# Patient Record
Sex: Female | Born: 1950 | Race: White | Hispanic: No | Marital: Married | Smoking: Never smoker
Health system: Southern US, Community
[De-identification: ages and names within clinical notes are randomized; demographics above are authoritative.]

## PROBLEM LIST (undated history)

## (undated) DIAGNOSIS — M069 Rheumatoid arthritis, unspecified: Secondary | ICD-10-CM

## (undated) DIAGNOSIS — R011 Cardiac murmur, unspecified: Secondary | ICD-10-CM

## (undated) DIAGNOSIS — Z9889 Other specified postprocedural states: Secondary | ICD-10-CM

## (undated) DIAGNOSIS — E785 Hyperlipidemia, unspecified: Secondary | ICD-10-CM

## (undated) DIAGNOSIS — I10 Essential (primary) hypertension: Secondary | ICD-10-CM

## (undated) DIAGNOSIS — J449 Chronic obstructive pulmonary disease, unspecified: Secondary | ICD-10-CM

## (undated) DIAGNOSIS — R112 Nausea with vomiting, unspecified: Secondary | ICD-10-CM

## (undated) DIAGNOSIS — K219 Gastro-esophageal reflux disease without esophagitis: Secondary | ICD-10-CM

## (undated) DIAGNOSIS — J45909 Unspecified asthma, uncomplicated: Secondary | ICD-10-CM

## (undated) DIAGNOSIS — I219 Acute myocardial infarction, unspecified: Secondary | ICD-10-CM

## (undated) DIAGNOSIS — R0602 Shortness of breath: Secondary | ICD-10-CM

## (undated) DIAGNOSIS — M1990 Unspecified osteoarthritis, unspecified site: Secondary | ICD-10-CM

## (undated) HISTORY — PX: TUBAL LIGATION: SHX77

## (undated) HISTORY — PX: ENDOMETRIAL ABLATION: SHX621

## (undated) HISTORY — PX: ROTATOR CUFF REPAIR: SHX139

---

## 2004-09-30 ENCOUNTER — Ambulatory Visit: Payer: Self-pay | Admitting: Obstetrics and Gynecology

## 2005-01-04 ENCOUNTER — Ambulatory Visit: Payer: Self-pay | Admitting: Obstetrics and Gynecology

## 2005-04-02 ENCOUNTER — Ambulatory Visit: Payer: Self-pay | Admitting: General Surgery

## 2005-04-29 ENCOUNTER — Ambulatory Visit: Payer: Self-pay

## 2005-05-10 ENCOUNTER — Encounter: Payer: Self-pay | Admitting: Orthopedic Surgery

## 2005-05-22 ENCOUNTER — Encounter: Payer: Self-pay | Admitting: Orthopedic Surgery

## 2005-06-22 ENCOUNTER — Encounter: Payer: Self-pay | Admitting: Orthopedic Surgery

## 2006-02-11 ENCOUNTER — Ambulatory Visit: Payer: Self-pay | Admitting: Obstetrics and Gynecology

## 2007-02-14 ENCOUNTER — Ambulatory Visit: Payer: Self-pay | Admitting: Obstetrics and Gynecology

## 2007-02-16 ENCOUNTER — Ambulatory Visit: Payer: Self-pay | Admitting: Obstetrics and Gynecology

## 2007-04-07 ENCOUNTER — Ambulatory Visit: Payer: Self-pay | Admitting: Gastroenterology

## 2008-02-20 ENCOUNTER — Ambulatory Visit: Payer: Self-pay | Admitting: Obstetrics and Gynecology

## 2008-11-22 HISTORY — PX: TRANSTHORACIC ECHOCARDIOGRAM: SHX275

## 2009-01-20 ENCOUNTER — Ambulatory Visit: Payer: Self-pay | Admitting: Specialist

## 2009-02-20 ENCOUNTER — Ambulatory Visit: Payer: Self-pay | Admitting: Obstetrics and Gynecology

## 2009-08-27 ENCOUNTER — Ambulatory Visit: Payer: Self-pay | Admitting: Orthopedic Surgery

## 2009-09-08 ENCOUNTER — Encounter: Payer: Self-pay | Admitting: Orthopedic Surgery

## 2009-09-22 ENCOUNTER — Encounter: Payer: Self-pay | Admitting: Orthopedic Surgery

## 2009-10-07 ENCOUNTER — Ambulatory Visit: Payer: Self-pay | Admitting: Orthopedic Surgery

## 2009-10-09 ENCOUNTER — Ambulatory Visit: Payer: Self-pay | Admitting: Orthopedic Surgery

## 2009-10-22 ENCOUNTER — Encounter: Payer: Self-pay | Admitting: Orthopedic Surgery

## 2009-11-22 ENCOUNTER — Encounter: Payer: Self-pay | Admitting: Orthopedic Surgery

## 2009-12-23 ENCOUNTER — Encounter: Payer: Self-pay | Admitting: Orthopedic Surgery

## 2010-01-20 ENCOUNTER — Encounter: Payer: Self-pay | Admitting: Orthopedic Surgery

## 2010-02-24 ENCOUNTER — Ambulatory Visit: Payer: Self-pay | Admitting: Obstetrics and Gynecology

## 2011-03-02 ENCOUNTER — Ambulatory Visit: Payer: Self-pay | Admitting: Obstetrics and Gynecology

## 2011-09-21 ENCOUNTER — Ambulatory Visit: Payer: Self-pay | Admitting: Rheumatology

## 2011-12-24 ENCOUNTER — Ambulatory Visit: Payer: Self-pay | Admitting: Unknown Physician Specialty

## 2012-03-07 ENCOUNTER — Ambulatory Visit: Payer: Self-pay | Admitting: Obstetrics and Gynecology

## 2012-04-06 ENCOUNTER — Other Ambulatory Visit: Payer: Self-pay | Admitting: Neurosurgery

## 2012-04-06 DIAGNOSIS — M549 Dorsalgia, unspecified: Secondary | ICD-10-CM

## 2012-04-06 DIAGNOSIS — M542 Cervicalgia: Secondary | ICD-10-CM

## 2012-04-06 DIAGNOSIS — M5410 Radiculopathy, site unspecified: Secondary | ICD-10-CM

## 2012-04-13 ENCOUNTER — Ambulatory Visit
Admission: RE | Admit: 2012-04-13 | Discharge: 2012-04-13 | Disposition: A | Discharge status: Home / Self Care | Payer: 59 | Source: Ambulatory Visit | Attending: Neurosurgery | Admitting: Neurosurgery

## 2012-04-13 VITALS — BP 135/71 | HR 78

## 2012-04-13 DIAGNOSIS — M549 Dorsalgia, unspecified: Secondary | ICD-10-CM

## 2012-04-13 DIAGNOSIS — M542 Cervicalgia: Secondary | ICD-10-CM

## 2012-04-13 DIAGNOSIS — M5410 Radiculopathy, site unspecified: Secondary | ICD-10-CM

## 2012-04-13 MED ORDER — IOHEXOL 300 MG/ML  SOLN
10.0000 mL | Freq: Once | INTRAMUSCULAR | Status: AC | PRN
  Administered 2012-04-13: 10 mL via INTRATHECAL

## 2012-04-13 MED ORDER — DIAZEPAM 5 MG PO TABS
10.0000 mg | ORAL_TABLET | Freq: Once | ORAL | Status: AC
  Administered 2012-04-13: 10 mg via ORAL

## 2012-04-13 MED ORDER — MEPERIDINE HCL 100 MG/ML IJ SOLN
75.0000 mg | Freq: Once | INTRAMUSCULAR | Status: AC
  Administered 2012-04-13: 75 mg via INTRAMUSCULAR

## 2012-04-13 MED ORDER — ONDANSETRON HCL 4 MG/2ML IJ SOLN
4.0000 mg | Freq: Once | INTRAMUSCULAR | Status: AC
  Administered 2012-04-13: 4 mg via INTRAMUSCULAR

## 2012-04-13 NOTE — Discharge Instructions (Signed)

## 2012-04-13 NOTE — Progress Notes (Signed)
meds given for pain at pt's request.dd

## 2012-04-25 ENCOUNTER — Other Ambulatory Visit: Payer: Self-pay | Admitting: Neurosurgery

## 2012-04-27 ENCOUNTER — Encounter (HOSPITAL_COMMUNITY): Payer: Self-pay | Admitting: Pharmacy Technician

## 2012-05-02 ENCOUNTER — Encounter (HOSPITAL_COMMUNITY): Payer: Self-pay

## 2012-05-02 ENCOUNTER — Encounter (HOSPITAL_COMMUNITY)
Admission: RE | Admit: 2012-05-02 | Discharge: 2012-05-02 | Disposition: A | Discharge status: Home / Self Care | Payer: 59 | Source: Ambulatory Visit | Attending: Neurosurgery | Admitting: Neurosurgery

## 2012-05-02 ENCOUNTER — Encounter (HOSPITAL_COMMUNITY)
Admission: RE | Admit: 2012-05-02 | Discharge: 2012-05-02 | Disposition: A | Discharge status: Home / Self Care | Payer: 59 | Source: Ambulatory Visit | Attending: Anesthesiology | Admitting: Anesthesiology

## 2012-05-02 HISTORY — DX: Hyperlipidemia, unspecified: E78.5

## 2012-05-02 HISTORY — DX: Unspecified asthma, uncomplicated: J45.909

## 2012-05-02 HISTORY — DX: Unspecified osteoarthritis, unspecified site: M19.90

## 2012-05-02 HISTORY — DX: Gastro-esophageal reflux disease without esophagitis: K21.9

## 2012-05-02 HISTORY — DX: Shortness of breath: R06.02

## 2012-05-02 HISTORY — DX: Essential (primary) hypertension: I10

## 2012-05-02 HISTORY — DX: Other specified postprocedural states: Z98.890

## 2012-05-02 HISTORY — DX: Nausea with vomiting, unspecified: R11.2

## 2012-05-02 HISTORY — DX: Cardiac murmur, unspecified: R01.1

## 2012-05-02 HISTORY — DX: Chronic obstructive pulmonary disease, unspecified: J44.9

## 2012-05-02 HISTORY — DX: Rheumatoid arthritis, unspecified: M06.9

## 2012-05-02 LAB — CBC
MCH: 29.9 pg (ref 26.0–34.0)
MCHC: 34.1 g/dL (ref 30.0–36.0)
MCV: 87.8 fL (ref 78.0–100.0)
Platelets: 289 10*3/uL (ref 150–400)
RBC: 4.41 MIL/uL (ref 3.87–5.11)

## 2012-05-02 LAB — BASIC METABOLIC PANEL
BUN: 10 mg/dL (ref 6–23)
CO2: 32 mEq/L (ref 19–32)
Calcium: 9.9 mg/dL (ref 8.4–10.5)
GFR calc non Af Amer: >90 mL/min (ref >90)
Glucose, Bld: 94 mg/dL (ref 70–99)
Sodium: 141 mEq/L (ref 135–145)

## 2012-05-02 NOTE — Progress Notes (Signed)
Requested records from pulmonologist Dr. Meredeth Ide at [facility]. Requested ECHO from [facility].

## 2012-05-02 NOTE — Pre-Procedure Instructions (Addendum)
21 Misty Briggs  05/02/2012   Your procedure is scheduled on:  Friday June 14  Report to [facility] at 7:15 AM.  Call this number if you have problems the morning of surgery: [phone]   Remember:   Do not eat or drink:After Midnight.    Take these medicines the morning of surgery with A SIP OF WATER: Prilosec - omeprazole, Plaquenil - hydroxychloroquine. Use Advair. Bring albuterol inhaler to surgery.    Do not wear jewelry, make-up or nail polish.  Do not wear lotions, powders, or perfumes. You may wear deodorant.  Do not shave 48 hours prior to surgery. Men may shave face and neck.  Do not bring valuables to the hospital.  Contacts, dentures or bridgework may not be worn into surgery.  Leave suitcase in the car. After surgery it may be brought to your room.  For patients admitted to the hospital, checkout time is 11:00 AM the day of discharge.   Patients discharged the day of surgery will not be allowed to drive home.  Name and phone number of your driver: NA  Special Instructions: CHG Shower Use Special Wash: 1/2 bottle night before surgery and 1/2 bottle morning of surgery.   Please read over the following fact sheets that you were given: Pain Booklet, Coughing and Deep Breathing and Surgical Site Infection Prevention

## 2012-05-03 NOTE — Consult Note (Addendum)
Anesthesia Chart Review:  Patient is a 61 year old female posted for one level ACDF on 05/05/12 by Dr. Jeral Fruit.  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  She receives primary care at [facility]) [phone]).  Her Pulmonologist is Dr. Meredeth Ide at [facility].  Labs noted.    CXR on 05/02/12 showed: No active infiltrate or effusion is seen. Opacities  overlying the right upper lung field most likely are artifactual  but comparison with any follow-up chest x-ray is recommended. Mediastinal contours appear normal. The heart is within normal limits in size. There are degenerative changes in the lower thoracic spine.  I called this result to Shanda Bumps at Dr. Cassandria Santee office and results sent to [facility] and Dr. Reita Cliche office.         She last saw Dr. Meredeth Ide in January 2013, and by notes appeared overall stable with the exception of an asthma exacerbation around Christmas.  Spirometry records at her last office visit showed FVC 2.97 (90% predicted), FEV1 2.2 (83% predicted), FEF 25-75% liters per second was 65% predicted.  Findings consistent with small airway disease.  EKG from 05/02/12 showed NSR, septal infarct (age undetermined). Her EKG appears stable since at least 05/28/10 (done at [facility]).  Echo done on 07/09/10 [facility]) showed normal LV contractile function, EF > 55%, mild MVP, mild MR, trivial AR, mild TR.    If no new CV symptoms, then anticipate she can proceed as planned.  Addendum: 05/04/12 1045  Jessica reports that Dr. Yetta Barre is ordering a CT scan of the chest.  She is trying to get it arranged for today or tomorrow morning before surgery.  Shonna Chock, PA-C

## 2012-05-04 NOTE — Progress Notes (Signed)
Spoke with Erie Noe regarding unsigned orders. She stated she would let Dr. Jeral Fruit known.

## 2012-05-04 NOTE — Progress Notes (Signed)
I spoke with patient and confirmed that her new arrival time is 0900, for OR at 1200.

## 2012-05-05 ENCOUNTER — Inpatient Hospital Stay (HOSPITAL_COMMUNITY)
Admission: RE | Admit: 2012-05-05 | Discharge: 2012-05-06 | DRG: 473 | Disposition: A | Discharge status: Home / Self Care | Payer: 59 | Source: Ambulatory Visit | Attending: Neurosurgery | Admitting: Neurosurgery

## 2012-05-05 ENCOUNTER — Ambulatory Visit (HOSPITAL_COMMUNITY): Payer: 59

## 2012-05-05 ENCOUNTER — Encounter (HOSPITAL_COMMUNITY)
Admission: RE | Disposition: A | Discharge status: Home / Self Care | Payer: Self-pay | Source: Ambulatory Visit | Attending: Neurosurgery

## 2012-05-05 ENCOUNTER — Ambulatory Visit (HOSPITAL_COMMUNITY): Payer: 59 | Admitting: Vascular Surgery

## 2012-05-05 ENCOUNTER — Encounter (HOSPITAL_COMMUNITY): Payer: Self-pay | Admitting: Vascular Surgery

## 2012-05-05 DIAGNOSIS — M069 Rheumatoid arthritis, unspecified: Secondary | ICD-10-CM | POA: Diagnosis present

## 2012-05-05 DIAGNOSIS — K219 Gastro-esophageal reflux disease without esophagitis: Secondary | ICD-10-CM | POA: Diagnosis present

## 2012-05-05 DIAGNOSIS — E119 Type 2 diabetes mellitus without complications: Secondary | ICD-10-CM | POA: Diagnosis present

## 2012-05-05 DIAGNOSIS — Z880 Allergy status to penicillin: Secondary | ICD-10-CM

## 2012-05-05 DIAGNOSIS — R011 Cardiac murmur, unspecified: Secondary | ICD-10-CM | POA: Diagnosis present

## 2012-05-05 DIAGNOSIS — M47812 Spondylosis without myelopathy or radiculopathy, cervical region: Principal | ICD-10-CM | POA: Diagnosis present

## 2012-05-05 DIAGNOSIS — J449 Chronic obstructive pulmonary disease, unspecified: Secondary | ICD-10-CM | POA: Diagnosis present

## 2012-05-05 DIAGNOSIS — J4489 Other specified chronic obstructive pulmonary disease: Secondary | ICD-10-CM | POA: Diagnosis present

## 2012-05-05 DIAGNOSIS — E785 Hyperlipidemia, unspecified: Secondary | ICD-10-CM | POA: Diagnosis present

## 2012-05-05 DIAGNOSIS — Z882 Allergy status to sulfonamides status: Secondary | ICD-10-CM

## 2012-05-05 HISTORY — PX: ANTERIOR CERVICAL DECOMP/DISCECTOMY FUSION: SHX1161

## 2012-05-05 LAB — GLUCOSE, CAPILLARY: Glucose-Capillary: 192 mg/dL — ABNORMAL HIGH (ref 70–99)

## 2012-05-05 SURGERY — ANTERIOR CERVICAL DECOMPRESSION/DISCECTOMY FUSION 1 LEVEL
Anesthesia: General | Site: Neck | Wound class: Clean

## 2012-05-05 MED ORDER — ONDANSETRON HCL 4 MG/2ML IJ SOLN
INTRAMUSCULAR | Status: DC | PRN
  Administered 2012-05-05: 4 mg via INTRAVENOUS

## 2012-05-05 MED ORDER — IRBESARTAN 75 MG PO TABS
75.0000 mg | ORAL_TABLET | Freq: Every day | ORAL | Status: DC
  Administered 2012-05-05: 75 mg via ORAL
  Filled 2012-05-05 (×2): qty 1

## 2012-05-05 MED ORDER — 0.9 % SODIUM CHLORIDE (POUR BTL) OPTIME
TOPICAL | Status: DC | PRN
  Administered 2012-05-05: 1000 mL

## 2012-05-05 MED ORDER — LIDOCAINE HCL 4 % MT SOLN
OROMUCOSAL | Status: DC | PRN
  Administered 2012-05-05: 4 mL via TOPICAL

## 2012-05-05 MED ORDER — HYDROXYCHLOROQUINE SULFATE 200 MG PO TABS
200.0000 mg | ORAL_TABLET | Freq: Two times a day (BID) | ORAL | Status: DC
  Administered 2012-05-05: 200 mg via ORAL
  Filled 2012-05-05 (×3): qty 1

## 2012-05-05 MED ORDER — ACETAMINOPHEN 650 MG RE SUPP: 650.0000 mg | RECTAL | Status: DC | PRN

## 2012-05-05 MED ORDER — PHENOL 1.4 % MT LIQD: 1.0000 | OROMUCOSAL | Status: DC | PRN

## 2012-05-05 MED ORDER — THROMBIN 5000 UNITS EX SOLR
CUTANEOUS | Status: DC | PRN
  Administered 2012-05-05 (×3): 5000 [IU] via TOPICAL

## 2012-05-05 MED ORDER — SIMVASTATIN 20 MG PO TABS
20.0000 mg | ORAL_TABLET | Freq: Every day | ORAL | Status: DC
  Administered 2012-05-05: 20 mg via ORAL
  Filled 2012-05-05 (×2): qty 1

## 2012-05-05 MED ORDER — OXYCODONE-ACETAMINOPHEN 5-325 MG PO TABS
1.0000 | ORAL_TABLET | ORAL | Status: DC | PRN
  Administered 2012-05-05: 2 via ORAL
  Filled 2012-05-05: qty 2

## 2012-05-05 MED ORDER — HEMOSTATIC AGENTS (NO CHARGE) OPTIME
TOPICAL | Status: DC | PRN
  Administered 2012-05-05 (×2): 1 via TOPICAL

## 2012-05-05 MED ORDER — ONDANSETRON HCL 4 MG/2ML IJ SOLN: 4.0000 mg | Freq: Once | INTRAMUSCULAR | Status: DC | PRN

## 2012-05-05 MED ORDER — DROPERIDOL 2.5 MG/ML IJ SOLN
INTRAMUSCULAR | Status: DC | PRN
  Administered 2012-05-05: 0.625 mg via INTRAVENOUS

## 2012-05-05 MED ORDER — HYDROMORPHONE HCL PF 1 MG/ML IJ SOLN: 0.2500 mg | INTRAMUSCULAR | Status: DC | PRN

## 2012-05-05 MED ORDER — METFORMIN HCL 500 MG PO TABS
500.0000 mg | ORAL_TABLET | Freq: Two times a day (BID) | ORAL | Status: DC
  Administered 2012-05-05 – 2012-05-06 (×2): 500 mg via ORAL
  Filled 2012-05-05 (×4): qty 1

## 2012-05-05 MED ORDER — HYDROCHLOROTHIAZIDE 25 MG PO TABS
25.0000 mg | ORAL_TABLET | Freq: Every day | ORAL | Status: DC
  Administered 2012-05-05: 25 mg via ORAL
  Filled 2012-05-05 (×2): qty 1

## 2012-05-05 MED ORDER — LORATADINE 10 MG PO TABS
10.0000 mg | ORAL_TABLET | Freq: Every evening | ORAL | Status: DC
  Administered 2012-05-05: 10 mg via ORAL
  Filled 2012-05-05 (×2): qty 1

## 2012-05-05 MED ORDER — SODIUM CHLORIDE 0.9 % IV SOLN: INTRAVENOUS | Status: DC

## 2012-05-05 MED ORDER — SCOPOLAMINE 1 MG/3DAYS TD PT72
MEDICATED_PATCH | TRANSDERMAL | Status: DC | PRN
  Administered 2012-05-05: 1 via TRANSDERMAL

## 2012-05-05 MED ORDER — GLYCOPYRROLATE 0.2 MG/ML IJ SOLN
INTRAMUSCULAR | Status: DC | PRN
  Administered 2012-05-05: 0.6 mg via INTRAVENOUS

## 2012-05-05 MED ORDER — DEXAMETHASONE SODIUM PHOSPHATE 4 MG/ML IJ SOLN
INTRAMUSCULAR | Status: DC | PRN
  Administered 2012-05-05: 4 mg via INTRAVENOUS

## 2012-05-05 MED ORDER — LEVOCETIRIZINE DIHYDROCHLORIDE 5 MG PO TABS: 5.0000 mg | ORAL_TABLET | Freq: Every evening | ORAL | Status: DC

## 2012-05-05 MED ORDER — VANCOMYCIN HCL IN DEXTROSE 1-5 GM/200ML-% IV SOLN
INTRAVENOUS | Status: AC
  Administered 2012-05-05: 1000 mg via INTRAVENOUS
  Filled 2012-05-05: qty 200

## 2012-05-05 MED ORDER — LIDOCAINE HCL (CARDIAC) 20 MG/ML IV SOLN
INTRAVENOUS | Status: DC | PRN
  Administered 2012-05-05: 100 mg via INTRAVENOUS

## 2012-05-05 MED ORDER — ONDANSETRON HCL 4 MG/2ML IJ SOLN: 4.0000 mg | INTRAMUSCULAR | Status: DC | PRN

## 2012-05-05 MED ORDER — MENTHOL 3 MG MT LOZG: 1.0000 | LOZENGE | OROMUCOSAL | Status: DC | PRN

## 2012-05-05 MED ORDER — VANCOMYCIN HCL 1000 MG IV SOLR
1250.0000 mg | Freq: Once | INTRAVENOUS | Status: AC
  Administered 2012-05-06: 1250 mg via INTRAVENOUS
  Filled 2012-05-05: qty 1250

## 2012-05-05 MED ORDER — ROCURONIUM BROMIDE 100 MG/10ML IV SOLN
INTRAVENOUS | Status: DC | PRN
  Administered 2012-05-05: 50 mg via INTRAVENOUS

## 2012-05-05 MED ORDER — SUFENTANIL CITRATE 50 MCG/ML IV SOLN
INTRAVENOUS | Status: DC | PRN
  Administered 2012-05-05 (×2): 20 ug via INTRAVENOUS

## 2012-05-05 MED ORDER — PROPOFOL 10 MG/ML IV EMUL
INTRAVENOUS | Status: DC | PRN
  Administered 2012-05-05: 200 mg via INTRAVENOUS

## 2012-05-05 MED ORDER — MIDAZOLAM HCL 5 MG/5ML IJ SOLN
INTRAMUSCULAR | Status: DC | PRN
  Administered 2012-05-05: 2 mg via INTRAVENOUS

## 2012-05-05 MED ORDER — LACTATED RINGERS IV SOLN
INTRAVENOUS | Status: DC | PRN
  Administered 2012-05-05 (×2): via INTRAVENOUS

## 2012-05-05 MED ORDER — SCOPOLAMINE 1 MG/3DAYS TD PT72
MEDICATED_PATCH | TRANSDERMAL | Status: AC
  Filled 2012-05-05: qty 1

## 2012-05-05 MED ORDER — FLUTICASONE-SALMETEROL 250-50 MCG/DOSE IN AEPB
1.0000 | INHALATION_SPRAY | Freq: Two times a day (BID) | RESPIRATORY_TRACT | Status: DC
  Administered 2012-05-05 – 2012-05-06 (×2): 1 via RESPIRATORY_TRACT
  Filled 2012-05-05: qty 14

## 2012-05-05 MED ORDER — ZOLPIDEM TARTRATE 10 MG PO TABS: 10.0000 mg | ORAL_TABLET | Freq: Every evening | ORAL | Status: DC | PRN

## 2012-05-05 MED ORDER — SODIUM CHLORIDE 0.9 % IJ SOLN: 3.0000 mL | INTRAMUSCULAR | Status: DC | PRN

## 2012-05-05 MED ORDER — MORPHINE SULFATE 4 MG/ML IJ SOLN: 4.0000 mg | INTRAMUSCULAR | Status: DC | PRN

## 2012-05-05 MED ORDER — MONTELUKAST SODIUM 10 MG PO TABS
10.0000 mg | ORAL_TABLET | Freq: Every day | ORAL | Status: DC
  Administered 2012-05-05: 10 mg via ORAL
  Filled 2012-05-05 (×2): qty 1

## 2012-05-05 MED ORDER — ALBUTEROL SULFATE HFA 108 (90 BASE) MCG/ACT IN AERS
2.0000 | INHALATION_SPRAY | Freq: Four times a day (QID) | RESPIRATORY_TRACT | Status: DC | PRN
  Filled 2012-05-05: qty 6.7

## 2012-05-05 MED ORDER — SODIUM CHLORIDE 0.9 % IJ SOLN
3.0000 mL | Freq: Two times a day (BID) | INTRAMUSCULAR | Status: DC
  Administered 2012-05-05: 3 mL via INTRAVENOUS

## 2012-05-05 MED ORDER — DIAZEPAM 5 MG PO TABS
5.0000 mg | ORAL_TABLET | Freq: Four times a day (QID) | ORAL | Status: DC | PRN
  Administered 2012-05-05: 5 mg via ORAL
  Filled 2012-05-05: qty 1

## 2012-05-05 MED ORDER — PANTOPRAZOLE SODIUM 40 MG PO TBEC: 40.0000 mg | DELAYED_RELEASE_TABLET | Freq: Every day | ORAL | Status: DC

## 2012-05-05 MED ORDER — NEOSTIGMINE METHYLSULFATE 1 MG/ML IJ SOLN
INTRAMUSCULAR | Status: DC | PRN
  Administered 2012-05-05: 3 mg via INTRAVENOUS

## 2012-05-05 MED ORDER — ACETAMINOPHEN 325 MG PO TABS: 650.0000 mg | ORAL_TABLET | ORAL | Status: DC | PRN

## 2012-05-05 SURGICAL SUPPLY — 58 items
BANDAGE GAUZE ELAST BULKY 4 IN (GAUZE/BANDAGES/DRESSINGS) ×4 IMPLANT
BENZOIN TINCTURE PRP APPL 2/3 (GAUZE/BANDAGES/DRESSINGS) ×2 IMPLANT
BIT DRILL SM SPINE QC 12 (BIT) ×2 IMPLANT
BLADE ULTRA TIP 2M (BLADE) ×2 IMPLANT
BUR BARREL STRAIGHT FLUTE 4.0 (BURR) IMPLANT
BUR MATCHSTICK NEURO 3.0 LAGG (BURR) ×2 IMPLANT
CANISTER SUCTION 2500CC (MISCELLANEOUS) ×2 IMPLANT
CLOTH BEACON ORANGE TIMEOUT ST (SAFETY) ×2 IMPLANT
CONT SPEC 4OZ CLIKSEAL STRL BL (MISCELLANEOUS) ×2 IMPLANT
CORDS BIPOLAR (ELECTRODE) ×2 IMPLANT
COVER MAYO STAND STRL (DRAPES) ×2 IMPLANT
DRAPE LAPAROTOMY 100X72 PEDS (DRAPES) ×2 IMPLANT
DRAPE MICROSCOPE LEICA (MISCELLANEOUS) ×2 IMPLANT
DRAPE POUCH INSTRU U-SHP 10X18 (DRAPES) ×2 IMPLANT
DURAPREP 6ML APPLICATOR 50/CS (WOUND CARE) ×2 IMPLANT
ELECT REM PT RETURN 9FT ADLT (ELECTROSURGICAL) ×2
ELECTRODE REM PT RTRN 9FT ADLT (ELECTROSURGICAL) ×1 IMPLANT
GAUZE SPONGE 4X4 16PLY XRAY LF (GAUZE/BANDAGES/DRESSINGS) IMPLANT
GLOVE BIO SURGEON STRL SZ8 (GLOVE) ×2 IMPLANT
GLOVE BIOGEL M 8.0 STRL (GLOVE) ×2 IMPLANT
GLOVE BIOGEL PI IND STRL 7.0 (GLOVE) ×1 IMPLANT
GLOVE BIOGEL PI IND STRL 8.5 (GLOVE) ×3 IMPLANT
GLOVE BIOGEL PI INDICATOR 7.0 (GLOVE) ×1
GLOVE BIOGEL PI INDICATOR 8.5 (GLOVE) ×3
GLOVE EXAM NITRILE LRG STRL (GLOVE) IMPLANT
GLOVE EXAM NITRILE MD LF STRL (GLOVE) IMPLANT
GLOVE EXAM NITRILE XL STR (GLOVE) IMPLANT
GLOVE EXAM NITRILE XS STR PU (GLOVE) IMPLANT
GLOVE SURG SS PI 6.5 STRL IVOR (GLOVE) ×2 IMPLANT
GLOVE SURG SS PI 8.0 STRL IVOR (GLOVE) ×6 IMPLANT
GOWN BRE IMP SLV AUR LG STRL (GOWN DISPOSABLE) ×4 IMPLANT
GOWN BRE IMP SLV AUR XL STRL (GOWN DISPOSABLE) ×2 IMPLANT
GOWN STRL REIN 2XL LVL4 (GOWN DISPOSABLE) ×4 IMPLANT
HEAD HALTER (SOFTGOODS) ×2 IMPLANT
HEMOSTAT POWDER KIT SURGIFOAM (HEMOSTASIS) ×2 IMPLANT
KIT BASIN OR (CUSTOM PROCEDURE TRAY) ×2 IMPLANT
KIT ROOM TURNOVER OR (KITS) ×2 IMPLANT
NEEDLE SPNL 22GX3.5 QUINCKE BK (NEEDLE) ×4 IMPLANT
NS IRRIG 1000ML POUR BTL (IV SOLUTION) ×2 IMPLANT
PACK LAMINECTOMY NEURO (CUSTOM PROCEDURE TRAY) ×2 IMPLANT
PATTIES SURGICAL .5 X1 (DISPOSABLE) ×2 IMPLANT
PLATE ANT CERV XTEND 1 LV 14 (Plate) ×2 IMPLANT
PUTTY DBX 1CC (Putty) ×2 IMPLANT
PUTTY DBX 1CC DEPUY (Putty) ×1 IMPLANT
RUBBERBAND STERILE (MISCELLANEOUS) ×4 IMPLANT
SCREW SELF TAP VARIABLE 4.6X12 (Screw) ×2 IMPLANT
SCREW XTD VAR 4.2 SELF TAP 12 (Screw) ×8 IMPLANT
SPACER COLONIAL SMALL 8MM (Spacer) ×2 IMPLANT
SPONGE GAUZE 4X4 12PLY (GAUZE/BANDAGES/DRESSINGS) ×2 IMPLANT
SPONGE INTESTINAL PEANUT (DISPOSABLE) ×2 IMPLANT
SPONGE SURGIFOAM ABS GEL SZ50 (HEMOSTASIS) ×2 IMPLANT
STRIP CLOSURE SKIN 1/2X4 (GAUZE/BANDAGES/DRESSINGS) ×2 IMPLANT
SUT VIC AB 3-0 SH 8-18 (SUTURE) ×4 IMPLANT
SYR 20ML ECCENTRIC (SYRINGE) ×2 IMPLANT
TAPE CLOTH SURG 4X10 WHT LF (GAUZE/BANDAGES/DRESSINGS) ×2 IMPLANT
TOWEL OR 17X24 6PK STRL BLUE (TOWEL DISPOSABLE) ×2 IMPLANT
TOWEL OR 17X26 10 PK STRL BLUE (TOWEL DISPOSABLE) ×2 IMPLANT
WATER STERILE IRR 1000ML POUR (IV SOLUTION) ×2 IMPLANT

## 2012-05-05 NOTE — Progress Notes (Signed)
Op note [id number]

## 2012-05-05 NOTE — Progress Notes (Signed)
Dr Jeral Fruit currently in the OR.  Spoke with the circulator in the room and informed her that pts orders are not signed yet.  Will need to have permit signed in holding.

## 2012-05-05 NOTE — Anesthesia Postprocedure Evaluation (Signed)
  Anesthesia Post-op Note  Patient: Misty Briggs  Procedure(s) Performed: Procedure(s) (LRB): ANTERIOR CERVICAL DECOMPRESSION/DISCECTOMY FUSION 1 LEVEL (N/A)  Patient Location: PACU  Anesthesia Type: General  Level of Consciousness: awake, alert  and oriented  Airway and Oxygen Therapy: Patient Spontanous Breathing and Patient connected to nasal cannula oxygen  Post-op Pain: mild  Post-op Assessment: Post-op Vital signs reviewed  Post-op Vital Signs: Reviewed  Complications: No apparent anesthesia complications

## 2012-05-05 NOTE — Plan of Care (Signed)
Problem: Consults Goal: Diagnosis - Spinal Surgery Outcome: Completed/Met Date Met:  05/05/12 Cervical Spine Fusion

## 2012-05-05 NOTE — H&P (Signed)
Misty Briggs is an 61 y.o. female.   Chief Complaint: neck pain HPI: neck pain with radiation to the right upper extremity no better with conservative treatment. Complete work up of the spine showed cervical67 hnp with spondylosis, t34 hnp and changes at lumbat 45 and 51  Past Medical History  Diagnosis Date  . PONV (postoperative nausea and vomiting)     itching sometimes  . Heart murmur   . Hypertension   . COPD (chronic obstructive pulmonary disease)     Dr. Meredeth Ide, [facility] pulmonologist  . HLD (hyperlipidemia)   . Arthritis   . Shortness of breath   . Asthma   . Diabetes mellitus   . GERD (gastroesophageal reflux disease)   . Rheumatoid arthritis     Past Surgical History  Procedure Date  . Transthoracic echocardiogram 2010    Misty Briggs  . Tubal ligation   . Rotator cuff repair     bilat  . Endometrial ablation     novasure    No family history on file. Social History:  reports that she has never smoked. She does not have any smokeless tobacco history on file. She reports that she does not drink alcohol or use illicit drugs.  Allergies:  Allergies  Allergen Reactions  . Penicillins Hives, Palpitations and Rash  . Sulfa Antibiotics Rash    Medications Prior to Admission  Medication Sig Dispense Refill  . Azelastine-Fluticasone (DYMISTA) 137-50 MCG/ACT SUSP Place 1 spray into the nose daily as needed. For congestion      . Fluticasone-Salmeterol (ADVAIR) 250-50 MCG/DOSE AEPB Inhale 1 puff into the lungs every 12 (twelve) hours.      . hydrochlorothiazide (HYDRODIURIL) 25 MG tablet Take 25 mg by mouth daily.      . hydroxychloroquine (PLAQUENIL) 200 MG tablet Take 200 mg by mouth 2 (two) times daily.      Marland Kitchen levocetirizine (XYZAL) 5 MG tablet Take 5 mg by mouth every evening.      . lovastatin (MEVACOR) 20 MG tablet Take 20 mg by mouth at bedtime.      . metFORMIN (GLUCOPHAGE) 500 MG tablet Take 500 mg by mouth 2 (two) times daily with a  meal.      . montelukast (SINGULAIR) 10 MG tablet Take 10 mg by mouth at bedtime.      Marland Kitchen omeprazole (PRILOSEC) 20 MG capsule Take 20 mg by mouth daily.      Marland Kitchen telmisartan (MICARDIS) 40 MG tablet Take 40 mg by mouth daily.      Marland Kitchen albuterol (PROVENTIL HFA;VENTOLIN HFA) 108 (90 BASE) MCG/ACT inhaler Inhale 2 puffs into the lungs every 6 (six) hours as needed.      Marland Kitchen EPINEPHrine (EPI-PEN) 0.3 mg/0.3 mL DEVI Inject 0.3 mg into the muscle once.        Results for orders placed during the hospital encounter of 05/05/12 (from the past 48 hour(s))  GLUCOSE, CAPILLARY     Status: Abnormal   Collection Time   05/05/12  8:59 AM      Component Value Range Comment   Glucose-Capillary 114 (*) 70 - 99 mg/dL    No results found.  Review of Systems  Constitutional: Negative.   HENT: Positive for neck pain.   Eyes: Negative.   Cardiovascular: Negative.   Gastrointestinal: Negative.   Genitourinary: Negative.   Skin: Negative.   Neurological: Positive for focal weakness.  Endo/Heme/Allergies: Negative.   Psychiatric/Behavioral: Negative.     Blood pressure 159/81, pulse 87,  temperature 98 F (36.7 C), temperature source Oral, resp. rate 20, SpO2 99.00%. Physical Exam hent, nl . Neck , some pain with mobility. Cv, nl. Lungs, clear. Abdomen, nl. Extremities, nl. NEURO. Cn, nl,strenght  Weakness of both thenar muscle. Dtr, nl  Assessment/Plan *patient to have decompression and fusion at c67.he and her family are aware of risks and benefits  Misty Briggs M 05/05/2012, 1:28 PM

## 2012-05-05 NOTE — Op Note (Signed)
NAMETRENESE, HAFT NO.:  [id number]  MEDICAL RECORD NO.:  [id number]  LOCATION:  3526                         FACILITY:  [facility]  PHYSICIAN:  Hilda Lias, M.D.   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  PROCEDURE:  Anterior C6-7 diskectomy, decompression of the spinal cord, bilateral foraminotomy, interbody fusion with cage with BDX and autograft, plate, microscope.  SURGEON:  Hilda Lias, M.D.  ASSISTANT:  Donalee Citrin, M.D.  CLINICAL HISTORY:  The patient was seen in my office because of back pain and neck pain.  The neck pain, the pain was going to right upper extremity.  X-ray showed that she has multiple level of spondylosis, but at the level of C6-C7, she has a herniated disk central to the right. Surgery was advised and she knew the risk with the surgery.  PROCEDURE:  The patient was taken to the OR and after intubation, the left side of the neck was cleaned with DuraPrep.  Transverse incision was made through the skin, subcutaneous tissue, platysma, a straight down to the cervical spine.  The patient has a thick neck and big shoulders and the x-rays showed the upper needle was at L5-6 and the lower at the level of C6-7.  From then on, we opened the ligament at the level of C6-7.  We had to bring the microscope into the area and drilled our way posteriorly.  I calcified posterior ligament was opened and immediately right underneath central into the right, there was several fragments of disk.  Decompression of the C6-7 nerve root on the right side and then in the left side with decompression of the spinal cord was achieved.  Then, the endplate were drilled and a hollow cage with BDX and autograft, 8  mm high, lordotic was inserted, followed by a plate using 4 screws.  Lateral cervical spine x-rays showed good position of the plate and the screws.  From then on, the area was irrigated.  We waited 10 minutes just to be sure that we had difficult hemostasis. Once this was done, the wound was closed with Vicryl and Steri-Strips.          ______________________________ Hilda Lias, M.D.     EB/MEDQ  D:  05/05/2012  T:  05/05/2012  Job:  [id number]

## 2012-05-05 NOTE — Progress Notes (Addendum)
ANTIBIOTIC CONSULT NOTE - INITIAL  Pharmacy Consult for Vancomcyin Indication: Surgical Prophylaxis  Assessment: 61yo admitted with c/o neck pain.  She has had surgical intervention today with a fusion at the cervical 6-7 without noted complications.  Spoke with Dr. Wynetta Emery and he wanted one dose of post-op Vancomycin to be given.  She has an allergy to penicillins and sulfa antibiotics.  She has a creatinine of 0.6 with an estimated clearance > 3ml/min.  She received IV Vancomycin pre-op at ~ 2PM today.  Goal of Therapy:  Post-op antibiotic coverage x 1  Plan:  - Give Vancomycin 1250mg  IV x 1 in the morning which will provide adequate postop empiric coverage. - Please let us know if we can provide any further assistance with this patients medication therapy.   Nadara Mustard, PharmD., MS Clinical Pharmacist Pager:  [phone]  Thank you for allowing pharmacy to be part of this patients care team. 05/05/2012,6:46 PM  Allergies  Allergen Reactions  . Penicillins Hives, Palpitations and Rash  . Sulfa Antibiotics Rash    Patient Measurements:   Body Weight: 74.2 kg  Vital Signs: Temp: 98.1 F (36.7 C) (06/14 1650) Temp src: Oral (06/14 0855) BP: 101/69 mmHg (06/14 1650) Pulse Rate: 84  (06/14 1650)  Intake/Output from this shift: Total I/O In: 1700 [I.V.:1700] Out: 200 [Blood:200]   Microbiology: Recent Results (from the past 720 hour(s))  SURGICAL PCR SCREEN     Status: Normal   Collection Time   05/02/12  9:41 AM      Component Value Range Status Comment   MRSA, PCR NEGATIVE  NEGATIVE Final    Staphylococcus aureus NEGATIVE  NEGATIVE Final     Medical History: Past Medical History  Diagnosis Date  . PONV (postoperative nausea and vomiting)     itching sometimes  . Heart murmur   . Hypertension   . COPD (chronic obstructive pulmonary disease)     Dr. Meredeth Ide, [facility] pulmonologist  . HLD (hyperlipidemia)   . Arthritis   . Shortness of  breath   . Asthma   . Diabetes mellitus   . GERD (gastroesophageal reflux disease)   . Rheumatoid arthritis     Medications:  Anti-infectives     Start     Dose/Rate Route Frequency Ordered Stop   05/06/12 0900   vancomycin (VANCOCIN) 1,250 mg in sodium chloride 0.9 % 250 mL IVPB        1,250 mg 166.7 mL/hr over 90 Minutes Intravenous  Once 05/05/12 1846     05/05/12 2200   hydroxychloroquine (PLAQUENIL) tablet 200 mg        200 mg Oral 2 times daily 05/05/12 1516     05/05/12 1217   vancomycin (VANCOCIN) 1 GM/200ML IVPB     Comments: BANKS, VERNON: cabinet override         05/05/12 1217 05/05/12 1350

## 2012-05-05 NOTE — Anesthesia Preprocedure Evaluation (Addendum)
Anesthesia Evaluation  Patient identified by MRN, date of birth, ID band Patient awake    Reviewed: Allergy & Precautions, H&P , NPO status , Patient's Chart, lab work & pertinent test results  History of Anesthesia Complications (+) PONV  Airway Mallampati: I TM Distance: >3 FB Neck ROM: Full    Dental  (+) Teeth Intact and Dental Advisory Given   Pulmonary asthma , COPD COPD inhaler,  breath sounds clear to auscultation        Cardiovascular hypertension, Pt. on medications Rhythm:Regular Rate:Normal     Neuro/Psych    GI/Hepatic GERD-  Medicated,  Endo/Other    Renal/GU      Musculoskeletal   Abdominal   Peds  Hematology   Anesthesia Other Findings   Reproductive/Obstetrics                           Anesthesia Physical Anesthesia Plan  ASA: III  Anesthesia Plan: General   Post-op Pain Management:    Induction: Intravenous  Airway Management Planned: Oral ETT  Additional Equipment:   Intra-op Plan:   Post-operative Plan: Extubation in OR  Informed Consent: I have reviewed the patients History and Physical, chart, labs and discussed the procedure including the risks, benefits and alternatives for the proposed anesthesia with the patient or authorized representative who has indicated his/her understanding and acceptance.   Dental advisory given  Plan Discussed with: CRNA, Anesthesiologist and Surgeon  Anesthesia Plan Comments:         Anesthesia Quick Evaluation

## 2012-05-05 NOTE — Transfer of Care (Signed)
Immediate Anesthesia Transfer of Care Note  Patient: Misty Briggs  Procedure(s) Performed: Procedure(s) (LRB): ANTERIOR CERVICAL DECOMPRESSION/DISCECTOMY FUSION 1 LEVEL (N/A)  Patient Location: PACU  Anesthesia Type: General  Level of Consciousness: sedated  Airway & Oxygen Therapy: Patient Spontanous Breathing and Patient connected to nasal cannula oxygen  Post-op Assessment: Report given to PACU RN and Post -op Vital signs reviewed and stable  Post vital signs: Reviewed and stable  Complications: No apparent anesthesia complications

## 2012-05-06 LAB — GLUCOSE, CAPILLARY: Glucose-Capillary: 111 mg/dL — ABNORMAL HIGH (ref 70–99)

## 2012-05-06 MED ORDER — OXYCODONE-ACETAMINOPHEN 5-325 MG PO TABS: 1.0000 | ORAL_TABLET | ORAL | Status: AC | PRN

## 2012-05-06 MED ORDER — DIAZEPAM 5 MG PO TABS: 5.0000 mg | ORAL_TABLET | Freq: Four times a day (QID) | ORAL | Status: AC | PRN

## 2012-05-06 NOTE — Progress Notes (Signed)
Subjective: Patient reports She's feeling great no arm pain very minimal neck stiffness and swelling difficulty  Objective: Vital signs in last 24 hours: Temp:  [97.8 F (36.6 C)-98.4 F (36.9 C)] 98.4 F (36.9 C) (06/15 0401) Pulse Rate:  [83-107] 97  (06/15 0401) Resp:  [15-22] 18  (06/15 0401) BP: (101-159)/(50-81) 122/57 mmHg (06/15 0401) SpO2:  [93 %-100 %] 97 % (06/15 0401)  Intake/Output from previous day: 06/14 0701 - 06/15 0700 In: 2180 [P.O.:480; I.V.:1700] Out: 200 [Blood:200] Intake/Output this shift:    Strength 5 out of 5 wound clean and dry  Lab Results: No results found for this basename: WBC:2,HGB:2,HCT:2,PLT:2 in the last 72 hours BMET No results found for this basename: NA:2,K:2,CL:2,CO2:2,GLUCOSE:2,BUN:2,CREATININE:2,CALCIUM:2 in the last 72 hours  Studies/Results: Dg Cervical Spine 2-3 Views  05/05/2012  *RADIOLOGY REPORT*  Clinical Data: ACDF C6-C7.  CERVICAL SPINE - 2-3 VIEW  Comparison: None.  Findings: Two intraoperative localization films demonstrate C6-C7 ACDF.  Endotracheal tube is present.  IMPRESSION: Lateral intraoperative localization demonstrates C6-C7 ACDF.  Original Report Authenticated By: Andreas Newport, M.D.    Assessment/Plan: Posterior day 1 from cervical doing very well discharged home  LOS: 1 day     Jsiah Menta P 05/06/2012, 7:48 AM

## 2012-05-06 NOTE — Discharge Summary (Signed)
  Physician Discharge Summary  Patient ID: Misty Briggs MRN: [id number] DOB/AGE: 60-25-1952 62 y.o.  Admit date: 05/05/2012 Discharge date: 05/06/2012  Admission Diagnoses: Cervical radiculopathy cervical spondylosis C6-7  Discharge Diagnoses: Same Active Problems:  * No active hospital problems. *    Discharged Condition: good  Hospital Course: Patient admitted hospital underwent an anterior cervical discectomy and fusion at C6-7 postop patient of her oh Coban the floor on the floor was convalescing well ambulating and voiding spontaneously tolerating regular diet and was he'll be discharged on postop day 1.  Consults: Significant Diagnostic Studies: Treatments: ACDF C6 and Discharge Exam: Blood pressure 122/57, pulse 97, temperature 98.4 F (36.9 C), temperature source Oral, resp. rate 18, SpO2 97.00%. Neurologically intact wound dry  Disposition: Home   Medication List  As of 05/06/2012  7:51 AM   TAKE these medications         albuterol 108 (90 BASE) MCG/ACT inhaler   Commonly known as: PROVENTIL HFA;VENTOLIN HFA   Inhale 2 puffs into the lungs every 6 (six) hours as needed.      diazepam 5 MG tablet   Commonly known as: VALIUM   Take 1 tablet (5 mg total) by mouth every 6 (six) hours as needed.      DYMISTA 137-50 MCG/ACT Susp   Generic drug: Azelastine-Fluticasone   Place 1 spray into the nose daily as needed. For congestion      EPINEPHrine 0.3 mg/0.3 mL Devi   Commonly known as: EPI-PEN   Inject 0.3 mg into the muscle once.      Fluticasone-Salmeterol 250-50 MCG/DOSE Aepb   Commonly known as: ADVAIR   Inhale 1 puff into the lungs every 12 (twelve) hours.      hydrochlorothiazide 25 MG tablet   Commonly known as: HYDRODIURIL   Take 25 mg by mouth daily.      hydroxychloroquine 200 MG tablet   Commonly known as: PLAQUENIL   Take 200 mg by mouth 2 (two) times daily.      levocetirizine 5 MG tablet   Commonly known as: XYZAL   Take 5 mg by mouth every  evening.      lovastatin 20 MG tablet   Commonly known as: MEVACOR   Take 20 mg by mouth at bedtime.      metFORMIN 500 MG tablet   Commonly known as: GLUCOPHAGE   Take 500 mg by mouth 2 (two) times daily with a meal.      montelukast 10 MG tablet   Commonly known as: SINGULAIR   Take 10 mg by mouth at bedtime.      omeprazole 20 MG capsule   Commonly known as: PRILOSEC   Take 20 mg by mouth daily.      oxyCODONE-acetaminophen 5-325 MG per tablet   Commonly known as: PERCOCET   Take 1-2 tablets by mouth every 4 (four) hours as needed.      telmisartan 40 MG tablet   Commonly known as: MICARDIS   Take 40 mg by mouth daily.             Signed: Clyde Upshaw P 05/06/2012, 7:51 AM

## 2012-05-06 NOTE — Discharge Instructions (Addendum)
No lifting no bending no twisting no driving a riding a car when she stomach were to Dr. Jeral Fruit. Keep the incision dry wrap and Saran wrap for showers. Wound Care Leave incision open to air. You may shower. Do not scrub directly on incision.  Leave steri-strips on neck.  They will fall off themselves. Do not put any creams, lotions, or ointments on incision. Activity Walk each and every day, increasing distance each day. No lifting greater than 5 lbs.  Avoid excessive neck motion. No driving for 2 weeks; may ride as a passenger locally. Wear neck brace at all times except when showering.  If provided soft collar, may wear for comfort unless otherwise instructed. Diet Resume your normal diet.  Return to Work Will be discussed at you follow up appointment. Call Your Doctor If Any of These Occur Redness, drainage, or swelling at the wound.  Temperature greater than 101 degrees. Severe pain not relieved by pain medication. Increased difficulty swallowing. Incision starts to come apart. Follow Up Appt Call today for appointment in 3-4 weeks ([phone]) or for problems.  If you have any hardware placed in your spine, you will need an x-ray before your appointment.

## 2012-05-08 ENCOUNTER — Encounter (HOSPITAL_COMMUNITY): Payer: Self-pay | Admitting: Neurosurgery

## 2012-06-23 ENCOUNTER — Ambulatory Visit: Payer: Self-pay | Admitting: Gastroenterology

## 2013-03-13 ENCOUNTER — Ambulatory Visit: Payer: Self-pay | Admitting: Obstetrics and Gynecology

## 2013-11-22 HISTORY — PX: FOOT SURGERY: SHX648

## 2014-05-14 ENCOUNTER — Ambulatory Visit: Payer: Self-pay | Admitting: Podiatry

## 2014-05-17 ENCOUNTER — Other Ambulatory Visit: Payer: Self-pay | Admitting: Podiatry

## 2014-05-17 LAB — D-DIMER(ARMC): D-Dimer: 446 ng/ml

## 2014-06-05 ENCOUNTER — Ambulatory Visit: Payer: Self-pay | Admitting: Podiatry

## 2014-07-09 ENCOUNTER — Ambulatory Visit: Payer: Self-pay | Admitting: Obstetrics and Gynecology

## 2014-09-10 DIAGNOSIS — M069 Rheumatoid arthritis, unspecified: Secondary | ICD-10-CM | POA: Insufficient documentation

## 2015-05-29 ENCOUNTER — Inpatient Hospital Stay: Payer: 59

## 2015-05-29 ENCOUNTER — Inpatient Hospital Stay: Payer: 59 | Attending: Oncology | Admitting: Oncology

## 2015-05-29 VITALS — BP 165/95 | HR 90 | Temp 98.4°F | Resp 20 | Ht 66.54 in | Wt 158.5 lb

## 2015-05-29 DIAGNOSIS — D508 Other iron deficiency anemias: Secondary | ICD-10-CM

## 2015-05-29 DIAGNOSIS — E785 Hyperlipidemia, unspecified: Secondary | ICD-10-CM | POA: Diagnosis not present

## 2015-05-29 DIAGNOSIS — D649 Anemia, unspecified: Secondary | ICD-10-CM | POA: Insufficient documentation

## 2015-05-29 DIAGNOSIS — K219 Gastro-esophageal reflux disease without esophagitis: Secondary | ICD-10-CM | POA: Insufficient documentation

## 2015-05-29 DIAGNOSIS — I10 Essential (primary) hypertension: Secondary | ICD-10-CM | POA: Diagnosis not present

## 2015-05-29 DIAGNOSIS — M1990 Unspecified osteoarthritis, unspecified site: Secondary | ICD-10-CM | POA: Diagnosis not present

## 2015-05-29 DIAGNOSIS — M069 Rheumatoid arthritis, unspecified: Secondary | ICD-10-CM | POA: Diagnosis not present

## 2015-05-29 DIAGNOSIS — Z79899 Other long term (current) drug therapy: Secondary | ICD-10-CM | POA: Insufficient documentation

## 2015-05-29 DIAGNOSIS — E119 Type 2 diabetes mellitus without complications: Secondary | ICD-10-CM | POA: Diagnosis not present

## 2015-05-29 LAB — LACTATE DEHYDROGENASE: LDH: 179 U/L (ref 98–192)

## 2015-05-29 LAB — CBC
HEMATOCRIT: 31.6 % — AB (ref 35.0–47.0)
HEMOGLOBIN: 10.4 g/dL — AB (ref 12.0–16.0)
MCH: 30.5 pg (ref 26.0–34.0)
MCHC: 32.8 g/dL (ref 32.0–36.0)
MCV: 93 fL (ref 80.0–100.0)
Platelets: 323 10*3/uL (ref 150–440)
RBC: 3.4 MIL/uL — AB (ref 3.80–5.20)
RDW: 14.4 % (ref 11.5–14.5)
WBC: 5.7 10*3/uL (ref 3.6–11.0)

## 2015-05-29 LAB — IRON AND TIBC
IRON: 98 ug/dL (ref 28–170)
SATURATION RATIOS: 27 % (ref 10.4–31.8)
TIBC: 366 ug/dL (ref 250–450)
UIBC: 268 ug/dL

## 2015-05-29 LAB — FOLATE: Folate: 68.7 ng/mL (ref >5.9)

## 2015-05-29 LAB — FERRITIN: FERRITIN: 42 ng/mL (ref 11–307)

## 2015-05-29 LAB — VITAMIN B12: Vitamin B-12: 278 pg/mL (ref 180–914)

## 2015-05-29 NOTE — Progress Notes (Signed)
Patient reports that she has been anemic for the past year and since changing to a new PCP they would like her to have evaluation.

## 2015-05-30 ENCOUNTER — Other Ambulatory Visit: Payer: 59

## 2015-05-30 LAB — ERYTHROPOIETIN: Erythropoietin: 24.4 m[IU]/mL — ABNORMAL HIGH (ref 2.6–18.5)

## 2015-05-30 LAB — HAPTOGLOBIN: HAPTOGLOBIN: 184 mg/dL (ref 34–200)

## 2015-06-16 NOTE — Progress Notes (Signed)
[place]  Telephone:(336) [phone] Fax:(336) [phone]  ID: [name] OB: 1951/11/06  MR#: [id number]  WNI#:[id number]  Patient Care Team: Minette Headland, NP as PCP - General (Hematology and Oncology)  CHIEF COMPLAINT:  Chief Complaint  Patient presents with  . New Evaluation    anemia    INTERVAL HISTORY: Patient is a 64 year old female who was found to have a declining hemoglobin and routine blood work. She currently feels well and is asymptomatic. She does not complain of weakness or fatigue. She has good appetite and denies weight loss. She denies any fevers. She has no chest pain or shortness of breath. She denies any nausea, vomiting, constipation, or diarrhea. She has no urinary complaints. Patient feels at her baseline and offers no specific complaints today.  REVIEW OF SYSTEMS:   Review of Systems  Respiratory: Negative.   Cardiovascular: Negative.   Gastrointestinal: Negative for blood in stool and melena.    As per HPI. Otherwise, a complete review of systems is negatve.  PAST MEDICAL HISTORY: Past Medical History  Diagnosis Date  . PONV (postoperative nausea and vomiting)     itching sometimes  . Heart murmur   . Hypertension   . COPD (chronic obstructive pulmonary disease)     Dr. Raul Del, [facility] pulmonologist  . HLD (hyperlipidemia)   . Arthritis   . Shortness of breath   . Asthma   . Diabetes mellitus   . GERD (gastroesophageal reflux disease)   . Rheumatoid arthritis     PAST SURGICAL HISTORY: Past Surgical History  Procedure Laterality Date  . Transthoracic echocardiogram  2010    [facility]  . Tubal ligation    . Rotator cuff repair      bilat  . Endometrial ablation      novasure  . Anterior cervical decomp/discectomy fusion  05/05/2012    Procedure: ANTERIOR CERVICAL DECOMPRESSION/DISCECTOMY FUSION 1 LEVEL;  Surgeon: Floyce Stakes, MD;  Location: [facility];  Service:  Neurosurgery;  Laterality: N/A;  Cervical six seven Anterior cervical decompression/diskectomy, Fusion, Plate    FAMILY HISTORY: Reviewed and unchanged. No report of malignancy or chronic disease.     ADVANCED DIRECTIVES:    HEALTH MAINTENANCE: History  Substance Use Topics  . Smoking status: Never Smoker   . Smokeless tobacco: Not on file  . Alcohol Use: No     Colonoscopy:  PAP:  Bone density:  Lipid panel:  Allergies  Allergen Reactions  . Penicillins Hives, Palpitations and Rash  . Sulfa Antibiotics Rash    Current Outpatient Prescriptions  Medication Sig Dispense Refill  . ADVAIR DISKUS 100-50 MCG/DOSE AEPB     . albuterol (PROVENTIL HFA;VENTOLIN HFA) 108 (90 BASE) MCG/ACT inhaler Inhale 2 puffs into the lungs every 6 (six) hours as needed.    Marland Kitchen aspirin EC 81 MG tablet Take by mouth.    . Azelastine-Fluticasone (DYMISTA) 137-50 MCG/ACT SUSP Place 1 spray into the nose daily as needed. For congestion    . EPINEPHrine (EPI-PEN) 0.3 mg/0.3 mL DEVI Inject 0.3 mg into the muscle once.    . etodolac (LODINE) 400 MG tablet     . ferrous sulfate 325 (65 FE) MG tablet Take by mouth.    . fluticasone (FLONASE) 50 MCG/ACT nasal spray Place into the nose.    . folic acid (FOLVITE) 1 MG tablet     . hydroxychloroquine (PLAQUENIL) 200 MG tablet Take 200 mg by mouth 2 (two) times daily.    Marland Kitchen  levocetirizine (XYZAL) 5 MG tablet Take 5 mg by mouth every evening.    . lovastatin (MEVACOR) 20 MG tablet Take 20 mg by mouth at bedtime.    . Magnesium 200 MG TABS Take by mouth.    . metFORMIN (GLUCOPHAGE) 1000 MG tablet     . methotrexate (RHEUMATREX) 2.5 MG tablet     . montelukast (SINGULAIR) 10 MG tablet Take 10 mg by mouth at bedtime.    Marland Kitchen omeprazole (PRILOSEC) 20 MG capsule Take 20 mg by mouth daily.    Marland Kitchen telmisartan (MICARDIS) 40 MG tablet Take 40 mg by mouth daily.    . hydrochlorothiazide (HYDRODIURIL) 25 MG tablet Take 25 mg by mouth daily.     No current  facility-administered medications for this visit.    OBJECTIVE: Filed Vitals:   05/29/15 1400  BP: 165/95  Pulse: 90  Temp: 98.4 F (36.9 C)  Resp: 20     Body mass index is 25.17 kg/(m^2).    ECOG FS:0 - Asymptomatic  General: Well-developed, well-nourished, no acute distress. Eyes: Pink conjunctiva, anicteric sclera. HEENT: Normocephalic, moist mucous membranes, clear oropharnyx. Lungs: Clear to auscultation bilaterally. Heart: Regular rate and rhythm. No rubs, murmurs, or gallops. Abdomen: Soft, nontender, nondistended. No organomegaly noted, normoactive bowel sounds. Musculoskeletal: No edema, cyanosis, or clubbing. Neuro: Alert, answering all questions appropriately. Cranial nerves grossly intact. Skin: No rashes or petechiae noted. Psych: Normal affect. Lymphatics: No cervical, calvicular, axillary or inguinal LAD.   LAB RESULTS:  Lab Results  Component Value Date   NA 141 05/02/2012   K 3.7 05/02/2012   CL 99 05/02/2012   CO2 32 05/02/2012   GLUCOSE 94 05/02/2012   BUN 10 05/02/2012   CREATININE 0.59 05/02/2012   CALCIUM 9.9 05/02/2012   GFRNONAA >90 05/02/2012   GFRAA >90 05/02/2012    Lab Results  Component Value Date   WBC 5.7 05/29/2015   HGB 10.4* 05/29/2015   HCT 31.6* 05/29/2015   MCV 93.0 05/29/2015   PLT 323 05/29/2015     STUDIES: No results found.  ASSESSMENT: Anemia.  PLAN:    1. Anemia: Patient's hemoglobin is mildly decreased, but still greater than 10.0. The remainder of her laboratory work including iron stores, B12, folate, and hemolysis labs are either negative or within normal limits. Given her age, MDS may be a possibility. This would require bone marrow biopsy to diagnose which is not necessary at this time. No intervention is needed at this time. Return to clinic in 4 months with repeat laboratory work and further evaluation.   Patient expressed understanding and was in agreement with this plan. She also understands that She  can call clinic at any time with any questions, concerns, or complaints.    Lloyd Huger, MD   06/16/2015 3:14 PM

## 2015-07-01 DIAGNOSIS — K219 Gastro-esophageal reflux disease without esophagitis: Secondary | ICD-10-CM | POA: Insufficient documentation

## 2015-07-01 DIAGNOSIS — I10 Essential (primary) hypertension: Secondary | ICD-10-CM | POA: Insufficient documentation

## 2015-07-01 DIAGNOSIS — E782 Mixed hyperlipidemia: Secondary | ICD-10-CM | POA: Insufficient documentation

## 2015-09-03 ENCOUNTER — Other Ambulatory Visit: Payer: Self-pay | Admitting: Neurosurgery

## 2015-09-03 DIAGNOSIS — M5415 Radiculopathy, thoracolumbar region: Secondary | ICD-10-CM

## 2015-09-04 ENCOUNTER — Ambulatory Visit
Admission: RE | Admit: 2015-09-04 | Discharge: 2015-09-04 | Disposition: A | Discharge status: Home / Self Care | Payer: 59 | Source: Ambulatory Visit | Attending: Neurosurgery | Admitting: Neurosurgery

## 2015-09-04 DIAGNOSIS — J45909 Unspecified asthma, uncomplicated: Secondary | ICD-10-CM | POA: Insufficient documentation

## 2015-09-04 DIAGNOSIS — M1990 Unspecified osteoarthritis, unspecified site: Secondary | ICD-10-CM | POA: Insufficient documentation

## 2015-09-04 DIAGNOSIS — M5415 Radiculopathy, thoracolumbar region: Secondary | ICD-10-CM

## 2015-09-04 MED ORDER — IOHEXOL 180 MG/ML  SOLN
1.0000 mL | Freq: Once | INTRAMUSCULAR | Status: DC | PRN
  Administered 2015-09-04: 1 mL via EPIDURAL

## 2015-09-04 MED ORDER — METHYLPREDNISOLONE ACETATE 40 MG/ML INJ SUSP (RADIOLOG
120.0000 mg | Freq: Once | INTRAMUSCULAR | Status: AC
  Administered 2015-09-04: 120 mg via EPIDURAL

## 2015-09-04 NOTE — Discharge Instructions (Signed)

## 2015-09-17 ENCOUNTER — Other Ambulatory Visit: Payer: Self-pay | Admitting: Adult Health

## 2015-09-17 ENCOUNTER — Other Ambulatory Visit: Payer: Self-pay | Admitting: Obstetrics and Gynecology

## 2015-09-17 DIAGNOSIS — Z1231 Encounter for screening mammogram for malignant neoplasm of breast: Secondary | ICD-10-CM

## 2015-09-18 ENCOUNTER — Ambulatory Visit
Admission: RE | Admit: 2015-09-18 | Discharge: 2015-09-18 | Disposition: A | Discharge status: Home / Self Care | Payer: 59 | Source: Ambulatory Visit | Attending: Obstetrics and Gynecology | Admitting: Obstetrics and Gynecology

## 2015-09-18 DIAGNOSIS — Z1231 Encounter for screening mammogram for malignant neoplasm of breast: Secondary | ICD-10-CM | POA: Insufficient documentation

## 2015-09-26 ENCOUNTER — Other Ambulatory Visit: Payer: Self-pay | Admitting: *Deleted

## 2015-09-26 DIAGNOSIS — D649 Anemia, unspecified: Secondary | ICD-10-CM

## 2015-09-29 ENCOUNTER — Inpatient Hospital Stay: Payer: 59 | Attending: Oncology

## 2015-09-29 ENCOUNTER — Inpatient Hospital Stay (HOSPITAL_BASED_OUTPATIENT_CLINIC_OR_DEPARTMENT_OTHER): Payer: 59 | Admitting: Oncology

## 2015-09-29 VITALS — BP 126/72 | HR 91 | Resp 18 | Wt 162.5 lb

## 2015-09-29 DIAGNOSIS — M069 Rheumatoid arthritis, unspecified: Secondary | ICD-10-CM

## 2015-09-29 DIAGNOSIS — Z7982 Long term (current) use of aspirin: Secondary | ICD-10-CM | POA: Insufficient documentation

## 2015-09-29 DIAGNOSIS — D649 Anemia, unspecified: Secondary | ICD-10-CM | POA: Insufficient documentation

## 2015-09-29 DIAGNOSIS — I10 Essential (primary) hypertension: Secondary | ICD-10-CM | POA: Diagnosis not present

## 2015-09-29 DIAGNOSIS — J449 Chronic obstructive pulmonary disease, unspecified: Secondary | ICD-10-CM

## 2015-09-29 DIAGNOSIS — Z7984 Long term (current) use of oral hypoglycemic drugs: Secondary | ICD-10-CM | POA: Insufficient documentation

## 2015-09-29 DIAGNOSIS — K219 Gastro-esophageal reflux disease without esophagitis: Secondary | ICD-10-CM | POA: Diagnosis not present

## 2015-09-29 DIAGNOSIS — Z79899 Other long term (current) drug therapy: Secondary | ICD-10-CM | POA: Diagnosis not present

## 2015-09-29 DIAGNOSIS — E119 Type 2 diabetes mellitus without complications: Secondary | ICD-10-CM | POA: Diagnosis not present

## 2015-09-29 DIAGNOSIS — E785 Hyperlipidemia, unspecified: Secondary | ICD-10-CM

## 2015-09-29 DIAGNOSIS — D508 Other iron deficiency anemias: Secondary | ICD-10-CM

## 2015-09-29 LAB — CBC WITH DIFFERENTIAL/PLATELET
BASOS ABS: 0 10*3/uL (ref 0–0.1)
BASOS PCT: 1 %
EOS ABS: 0.2 10*3/uL (ref 0–0.7)
EOS PCT: 4 %
HEMATOCRIT: 34.2 % — AB (ref 35.0–47.0)
Hemoglobin: 11.2 g/dL — ABNORMAL LOW (ref 12.0–16.0)
Lymphocytes Relative: 25 %
Lymphs Abs: 1.5 10*3/uL (ref 1.0–3.6)
MCH: 30.7 pg (ref 26.0–34.0)
MCHC: 32.9 g/dL (ref 32.0–36.0)
MCV: 93.5 fL (ref 80.0–100.0)
MONO ABS: 0.3 10*3/uL (ref 0.2–0.9)
Monocytes Relative: 6 %
NEUTROS ABS: 3.7 10*3/uL (ref 1.4–6.5)
Neutrophils Relative %: 64 %
PLATELETS: 314 10*3/uL (ref 150–440)
RBC: 3.66 MIL/uL — ABNORMAL LOW (ref 3.80–5.20)
RDW: 13.8 % (ref 11.5–14.5)
WBC: 5.8 10*3/uL (ref 3.6–11.0)

## 2015-09-29 LAB — IRON AND TIBC
Iron: 46 ug/dL (ref 28–170)
SATURATION RATIOS: 12 % (ref 10.4–31.8)
TIBC: 392 ug/dL (ref 250–450)
UIBC: 346 ug/dL

## 2015-09-29 LAB — FERRITIN: FERRITIN: 31 ng/mL (ref 11–307)

## 2015-10-12 NOTE — Progress Notes (Signed)
[place]  Telephone:(336) [phone] Fax:(336) [phone]  ID: Misty Briggs OB: 04-14-1951  MR#: [id number]  UQJ#:[id number]  Patient Care Team: Minette Headland, NP as PCP - General (Hematology and Oncology)  CHIEF COMPLAINT:  Chief Complaint  Patient presents with  . Anemia    INTERVAL HISTORY: Patient returns to clinic today for repeat laboratory work and further evaluation. She continues to feel well and is asymptomatic. She does not complain of weakness or fatigue. She has a good appetite and denies weight loss. She denies any fevers. She has no chest pain or shortness of breath. She denies any nausea, vomiting, constipation, or diarrhea. She has no urinary complaints. Patient offers no specific complaints today.  REVIEW OF SYSTEMS:   Review of Systems  Constitutional: Negative.  Negative for fever and malaise/fatigue.  Respiratory: Negative.   Cardiovascular: Negative.   Gastrointestinal: Negative for blood in stool and melena.  Neurological: Negative for weakness.    As per HPI. Otherwise, a complete review of systems is negatve.  PAST MEDICAL HISTORY: Past Medical History  Diagnosis Date  . PONV (postoperative nausea and vomiting)     itching sometimes  . Heart murmur   . Hypertension   . COPD (chronic obstructive pulmonary disease) (HCC)     Dr. Raul Del, [facility] pulmonologist  . HLD (hyperlipidemia)   . Arthritis   . Shortness of breath   . Asthma   . Diabetes mellitus   . GERD (gastroesophageal reflux disease)   . Rheumatoid arthritis(714.0)     PAST SURGICAL HISTORY: Past Surgical History  Procedure Laterality Date  . Transthoracic echocardiogram  2010    [facility]  . Tubal ligation    . Rotator cuff repair      bilat  . Endometrial ablation      novasure  . Anterior cervical decomp/discectomy fusion  05/05/2012    Procedure: ANTERIOR CERVICAL DECOMPRESSION/DISCECTOMY FUSION 1 LEVEL;  Surgeon: Floyce Stakes, MD;  Location: [facility];  Service: Neurosurgery;  Laterality: N/A;  Cervical six seven Anterior cervical decompression/diskectomy, Fusion, Plate    FAMILY HISTORY: Reviewed and unchanged. No report of malignancy or chronic disease.     ADVANCED DIRECTIVES:    HEALTH MAINTENANCE: Social History  Substance Use Topics  . Smoking status: Never Smoker   . Smokeless tobacco: Not on file  . Alcohol Use: No     Colonoscopy:  PAP:  Bone density:  Lipid panel:  Allergies  Allergen Reactions  . Penicillins Hives, Palpitations and Rash  . Sulfa Antibiotics Rash    Current Outpatient Prescriptions  Medication Sig Dispense Refill  . ADVAIR DISKUS 100-50 MCG/DOSE AEPB     . albuterol (PROVENTIL HFA;VENTOLIN HFA) 108 (90 BASE) MCG/ACT inhaler Inhale 2 puffs into the lungs every 6 (six) hours as needed.    Marland Kitchen aspirin EC 81 MG tablet Take by mouth.    . Azelastine-Fluticasone (DYMISTA) 137-50 MCG/ACT SUSP Place 1 spray into the nose daily as needed. For congestion    . diazepam (VALIUM) 5 MG tablet     . EPINEPHrine (EPI-PEN) 0.3 mg/0.3 mL DEVI Inject 0.3 mg into the muscle once.    . etodolac (LODINE) 400 MG tablet     . ferrous sulfate 325 (65 FE) MG tablet Take by mouth.    . fluticasone (FLONASE) 50 MCG/ACT nasal spray Place into the nose.    . folic acid (FOLVITE) 1 MG tablet     . furosemide (LASIX) 20  MG tablet Take by mouth.    . hydrochlorothiazide (HYDRODIURIL) 25 MG tablet Take 25 mg by mouth daily.    . hydroxychloroquine (PLAQUENIL) 200 MG tablet Take 200 mg by mouth 2 (two) times daily.    Marland Kitchen levocetirizine (XYZAL) 5 MG tablet Take 5 mg by mouth every evening.    . lovastatin (MEVACOR) 20 MG tablet Take 20 mg by mouth at bedtime.    . Magnesium 200 MG TABS Take by mouth.    . metFORMIN (GLUCOPHAGE) 1000 MG tablet     . methotrexate (RHEUMATREX) 2.5 MG tablet     . montelukast (SINGULAIR) 10 MG tablet Take 10 mg by mouth at bedtime.    Marland Kitchen omeprazole (PRILOSEC)  20 MG capsule Take 20 mg by mouth daily.    . potassium chloride (K-DUR) 10 MEQ tablet Take by mouth.    . telmisartan (MICARDIS) 40 MG tablet Take 40 mg by mouth daily.    . traMADol (ULTRAM) 50 MG tablet      No current facility-administered medications for this visit.    OBJECTIVE: Filed Vitals:   09/29/15 1445  BP: 126/72  Pulse: 91  Resp: 18     Body mass index is 25.8 kg/(m^2).    ECOG FS:0 - Asymptomatic  General: Well-developed, well-nourished, no acute distress. Eyes: Pink conjunctiva, anicteric sclera. Lungs: Clear to auscultation bilaterally. Heart: Regular rate and rhythm. No rubs, murmurs, or gallops. Abdomen: Soft, nontender, nondistended. No organomegaly noted, normoactive bowel sounds. Musculoskeletal: No edema, cyanosis, or clubbing. Neuro: Alert, answering all questions appropriately. Cranial nerves grossly intact. Skin: No rashes or petechiae noted. Psych: Normal affect.   LAB RESULTS:  Lab Results  Component Value Date   NA 141 05/02/2012   K 3.7 05/02/2012   CL 99 05/02/2012   CO2 32 05/02/2012   GLUCOSE 94 05/02/2012   BUN 10 05/02/2012   CREATININE 0.59 05/02/2012   CALCIUM 9.9 05/02/2012   GFRNONAA >90 05/02/2012   GFRAA >90 05/02/2012    Lab Results  Component Value Date   WBC 5.8 09/29/2015   NEUTROABS 3.7 09/29/2015   HGB 11.2* 09/29/2015   HCT 34.2* 09/29/2015   MCV 93.5 09/29/2015   PLT 314 09/29/2015     STUDIES: Mm Digital Screening Bilateral  09/18/2015  CLINICAL DATA:  Screening. EXAM: DIGITAL SCREENING BILATERAL MAMMOGRAM WITH CAD COMPARISON:  Previous exam(s). ACR Breast Density Category b: There are scattered areas of fibroglandular density. FINDINGS: There are no findings suspicious for malignancy. Images were processed with CAD. IMPRESSION: No mammographic evidence of malignancy. A result letter of this screening mammogram will be mailed directly to the patient. RECOMMENDATION: Screening mammogram in one year.  (Code:SM-B-01Y) BI-RADS CATEGORY  1: Negative. Electronically Signed   By: Evangeline Dakin M.D.   On: 09/18/2015 16:10    ASSESSMENT: Anemia.  PLAN:    1. Anemia: Patient's hemoglobin is mildly decreased, but stable at 11.2. The remainder of her laboratory work including iron stores, B12, folate, and hemolysis labs are either negative or within normal limits. Given her age, MDS may be a possibility. This would require bone marrow biopsy to diagnose which is not necessary at this time. Because her hemoglobin is greater than 10, she does not require Procrit. After discussion with the patient, no further follow-up has been scheduled. Recommend monitoring her hemoglobin 1-2 times per year and please refer her back for further evaluation if her hemoglobin falls below 10.0.  Patient expressed understanding and was in agreement with this plan.  She also understands that She can call clinic at any time with any questions, concerns, or complaints.    Lloyd Huger, MD   10/12/2015 6:53 AM

## 2015-11-12 ENCOUNTER — Ambulatory Visit (INDEPENDENT_AMBULATORY_CARE_PROVIDER_SITE_OTHER): Payer: 59 | Admitting: General Surgery

## 2015-11-12 ENCOUNTER — Encounter: Payer: Self-pay | Admitting: General Surgery

## 2015-11-12 VITALS — BP 144/78 | HR 88 | Resp 14 | Ht 66.0 in | Wt 171.0 lb

## 2015-11-12 DIAGNOSIS — L729 Follicular cyst of the skin and subcutaneous tissue, unspecified: Secondary | ICD-10-CM | POA: Diagnosis not present

## 2015-11-12 DIAGNOSIS — L089 Local infection of the skin and subcutaneous tissue, unspecified: Secondary | ICD-10-CM

## 2015-11-12 NOTE — Patient Instructions (Addendum)
The patient is aware to call back for any questions or concerns. Patient to return in two weeks 

## 2015-11-12 NOTE — Progress Notes (Signed)
Patient ID: Misty Briggs, female   DOB: 1951/07/23, 64 y.o.   MRN: [id number]  Chief Complaint  Patient presents with  . Cyst    HPI Misty Briggs is a 64 y.o. female.  Here today for evaluation of an infected cyst on her upper mid back. She states it has been there for about a year. It started as a black head. Last year it started drainage occasionally ans swelled up some. She states it started getting worse over the past month. Her husband can squeeze stuff out periodically. She is currently on an antibiotic. She feels like her hand goes numb for the past 2 weeks and wonders if it is against a nerve or carpal tunnel . She also has some upper respiratory  Symptoms. I have reviewed the history of present illness with the patient.  HPI  Past Medical History  Diagnosis Date  . PONV (postoperative nausea and vomiting)     itching sometimes  . Heart murmur   . Hypertension   . COPD (chronic obstructive pulmonary disease) (HCC)     Dr. Meredeth Ide, Misty Briggs pulmonologist  . HLD (hyperlipidemia)   . Arthritis   . Shortness of breath   . Asthma   . Diabetes mellitus   . GERD (gastroesophageal reflux disease)   . Rheumatoid arthritis(714.0)     Past Surgical History  Procedure Laterality Date  . Transthoracic echocardiogram  2010    [facility]  . Tubal ligation    . Rotator cuff repair      bilat  . Endometrial ablation      novasure  . Anterior cervical decomp/discectomy fusion  05/05/2012    Procedure: ANTERIOR CERVICAL DECOMPRESSION/DISCECTOMY FUSION 1 LEVEL;  Surgeon: Misty Cassis, MD;  Location: [facility];  Service: Neurosurgery;  Laterality: N/A;  Cervical six seven Anterior cervical decompression/diskectomy, Fusion, Plate  . Foot surgery  2015    Family History  Problem Relation Age of Onset  . Breast cancer Paternal Aunt     Social History Social History  Substance Use Topics  . Smoking status: Never Smoker   . Smokeless tobacco:  Never Used  . Alcohol Use: No    Allergies  Allergen Reactions  . Penicillins Hives, Palpitations and Rash  . Sulfa Antibiotics Rash    Current Outpatient Prescriptions  Medication Sig Dispense Refill  . aspirin EC 81 MG tablet Take by mouth.    . doxycycline (VIBRAMYCIN) 100 MG capsule Take 100 mg by mouth 2 (two) times daily.    Marland Kitchen EPINEPHrine (EPI-PEN) 0.3 mg/0.3 mL DEVI Inject 0.3 mg into the muscle once.    . etodolac (LODINE) 400 MG tablet     . ferrous sulfate 325 (65 FE) MG tablet Take by mouth.    . fluticasone (FLONASE) 50 MCG/ACT nasal spray Place into the nose.    . folic acid (FOLVITE) 1 MG tablet     . furosemide (LASIX) 20 MG tablet Take by mouth.    . hydrochlorothiazide (HYDRODIURIL) 25 MG tablet Take 25 mg by mouth daily.    Marland Kitchen levocetirizine (XYZAL) 5 MG tablet Take 5 mg by mouth every evening.    . lovastatin (MEVACOR) 20 MG tablet Take 20 mg by mouth at bedtime.    . Magnesium 200 MG TABS Take by mouth.    . metFORMIN (GLUCOPHAGE) 1000 MG tablet     . methotrexate (RHEUMATREX) 2.5 MG tablet     . montelukast (SINGULAIR) 10 MG tablet Take  10 mg by mouth at bedtime.    Marland Kitchen omeprazole (PRILOSEC) 20 MG capsule Take 20 mg by mouth daily.    . potassium chloride (K-DUR) 10 MEQ tablet Take by mouth.     No current facility-administered medications for this visit.    Review of Systems Review of Systems  Constitutional: Negative.   Respiratory: Negative.   Cardiovascular: Negative.     Blood pressure 144/78, pulse 88, resp. rate 14, height 5\' 6"  (1.676 m), weight 171 lb (77.565 kg).  Physical Exam Physical Exam  Constitutional: She is oriented to person, place, and time. She appears well-developed and well-nourished.  Neurological: She is alert and oriented to person, place, and time.  Skin: Skin is warm and dry.       Data Reviewed PCP notes Assessment    Infected cyst on her back. Choinc by history, currently infected    Plan Continue and complete  course of antibiotic. Once infection is resolved will plan to excise this area       Patient to return in two weeks.     PCP:  This information has been scribed by Illa Level RNBC.    SANKAR,SEEPLAPUTHUR G 11/12/2015, 1:59 PM

## 2015-11-26 ENCOUNTER — Encounter: Payer: Self-pay | Admitting: General Surgery

## 2015-11-26 ENCOUNTER — Ambulatory Visit (INDEPENDENT_AMBULATORY_CARE_PROVIDER_SITE_OTHER): Payer: 59 | Admitting: General Surgery

## 2015-11-26 VITALS — BP 162/80 | HR 98 | Resp 16 | Ht 66.0 in | Wt 171.0 lb

## 2015-11-26 DIAGNOSIS — L729 Follicular cyst of the skin and subcutaneous tissue, unspecified: Secondary | ICD-10-CM

## 2015-11-26 DIAGNOSIS — L089 Local infection of the skin and subcutaneous tissue, unspecified: Secondary | ICD-10-CM

## 2015-11-26 NOTE — Progress Notes (Signed)
This is a 65 year old femle here today for her follow up infected cyst upper back. She states it seems to be better and drains some. She was on an antibiotic for an upper respiratory infection and had a reaction. I have reviewed the history of present illness with the patient.  A 1.5 cm nodule with mild induration around edge but no active drainage is noted. She is currently on prednisone for the upper respiratory infection. Follow up in 2 weeks for office excision skin cyst.   PCP:  Lucienne Capers This information has been scribed by Dorathy Daft RNBC.

## 2015-11-26 NOTE — Patient Instructions (Addendum)
Patient to return 2 weeks Warm compresses as toelrated

## 2015-11-27 ENCOUNTER — Encounter: Payer: Self-pay | Admitting: General Surgery

## 2015-12-10 ENCOUNTER — Ambulatory Visit (INDEPENDENT_AMBULATORY_CARE_PROVIDER_SITE_OTHER): Payer: 59 | Admitting: General Surgery

## 2015-12-10 ENCOUNTER — Encounter: Payer: Self-pay | Admitting: General Surgery

## 2015-12-10 VITALS — BP 138/86 | HR 90 | Resp 14 | Ht 66.0 in | Wt 171.0 lb

## 2015-12-10 DIAGNOSIS — L729 Follicular cyst of the skin and subcutaneous tissue, unspecified: Secondary | ICD-10-CM | POA: Diagnosis not present

## 2015-12-10 NOTE — Patient Instructions (Addendum)
Keep area clean and dry. You may shower. You may remove the waterproof dressing in 2 days. You can use triple ATB ointment and a Band-Aid to the area. Return in 10-14 days for suture removal.

## 2015-12-10 NOTE — Progress Notes (Signed)
Patient ID: Misty Briggs, female   DOB: Nov 18, 1951, 65 y.o.   MRN: [id number] Patient here for scheduled excision of cyst on upper back. She states that the area is smaller and reports no drainage.  I have reviewed the history of present illness with the patient.  Exam shows a healed skin cyst upper back. No drainage or signs of active infection. Cyst excised today.  Procedure: Excision skin cyst upper back.  Anesthetic: 49mL 1% xylocaine mixed with 0.5% marcaine   Prep: Chloroprep  Description:  Local anesthetic was injected around the area. The patient was placed in prone position and the area was draped in a sterile fashion. A 1cm x 2.5cm vertical elliptical incision was created the cyst was excised circumferentially. Hemostasis was achieved using cautery and suture ligatures of 3-0 vicryl in the deep tissure. The wound was irrigated with saline. Deep layer was closed with interrupted 3-0 Vicryl and the skin layer was closed with interrupted vertical mattress sutures of 4-0 Nylon. Telfa and Tegaderm dressing was applied. The patient tolerated the procedure well without any immediate complications. Excised tissue was sent for pathology.  Patient advised on wound care and to return in 10-14 days for suture removal.    This has been scribed by Sinda Du LPN KCL:EXNTZGY Cornetto

## 2015-12-16 ENCOUNTER — Telehealth: Payer: Self-pay | Admitting: *Deleted

## 2015-12-16 NOTE — Telephone Encounter (Signed)
-----   Message from Kieth Brightly, MD sent at 12/13/2015 10:27 AM EST ----- Please let pt pt know the pathology was normal.

## 2015-12-17 NOTE — Telephone Encounter (Signed)
Notified patient as instructed, patient pleased. Discussed follow-up appointments, patient agrees  

## 2015-12-23 ENCOUNTER — Ambulatory Visit: Payer: 59

## 2015-12-23 ENCOUNTER — Ambulatory Visit (INDEPENDENT_AMBULATORY_CARE_PROVIDER_SITE_OTHER): Payer: 59 | Admitting: *Deleted

## 2015-12-23 DIAGNOSIS — L729 Follicular cyst of the skin and subcutaneous tissue, unspecified: Secondary | ICD-10-CM

## 2015-12-23 DIAGNOSIS — N6320 Unspecified lump in the left breast, unspecified quadrant: Secondary | ICD-10-CM

## 2015-12-23 NOTE — Patient Instructions (Signed)
The patient is aware to call back for any questions or concerns.  

## 2015-12-23 NOTE — Progress Notes (Signed)
Patient came in today for a wound check/suture removal.  The wound is clean, with no signs of infection noted. Follow up as scheduled. 

## 2016-03-09 ENCOUNTER — Other Ambulatory Visit: Payer: Self-pay | Admitting: Obstetrics and Gynecology

## 2016-03-09 DIAGNOSIS — Z1231 Encounter for screening mammogram for malignant neoplasm of breast: Secondary | ICD-10-CM

## 2016-09-21 ENCOUNTER — Ambulatory Visit
Admission: RE | Admit: 2016-09-21 | Discharge: 2016-09-21 | Disposition: A | Discharge status: Home / Self Care | Payer: 59 | Source: Ambulatory Visit | Attending: Obstetrics and Gynecology | Admitting: Obstetrics and Gynecology

## 2016-09-21 DIAGNOSIS — Z1231 Encounter for screening mammogram for malignant neoplasm of breast: Secondary | ICD-10-CM

## 2017-03-15 ENCOUNTER — Other Ambulatory Visit: Payer: Self-pay | Admitting: Obstetrics and Gynecology

## 2017-03-15 DIAGNOSIS — Z1231 Encounter for screening mammogram for malignant neoplasm of breast: Secondary | ICD-10-CM

## 2017-09-08 ENCOUNTER — Ambulatory Visit: Payer: Commercial Managed Care - HMO | Admitting: Anesthesiology

## 2017-09-08 ENCOUNTER — Encounter
Admission: RE | Disposition: A | Discharge status: Home / Self Care | Payer: Self-pay | Source: Ambulatory Visit | Attending: Gastroenterology

## 2017-09-08 ENCOUNTER — Ambulatory Visit
Admission: RE | Admit: 2017-09-08 | Discharge: 2017-09-08 | Disposition: A | Discharge status: Home / Self Care | Payer: Commercial Managed Care - HMO | Source: Ambulatory Visit | Attending: Gastroenterology | Admitting: Gastroenterology

## 2017-09-08 ENCOUNTER — Encounter: Payer: Self-pay | Admitting: *Deleted

## 2017-09-08 DIAGNOSIS — K621 Rectal polyp: Secondary | ICD-10-CM | POA: Diagnosis not present

## 2017-09-08 DIAGNOSIS — K5730 Diverticulosis of large intestine without perforation or abscess without bleeding: Secondary | ICD-10-CM | POA: Diagnosis not present

## 2017-09-08 DIAGNOSIS — J449 Chronic obstructive pulmonary disease, unspecified: Secondary | ICD-10-CM | POA: Insufficient documentation

## 2017-09-08 DIAGNOSIS — K219 Gastro-esophageal reflux disease without esophagitis: Secondary | ICD-10-CM | POA: Diagnosis not present

## 2017-09-08 DIAGNOSIS — Z86010 Personal history of colonic polyps: Secondary | ICD-10-CM | POA: Diagnosis present

## 2017-09-08 DIAGNOSIS — K635 Polyp of colon: Secondary | ICD-10-CM | POA: Diagnosis not present

## 2017-09-08 DIAGNOSIS — Z880 Allergy status to penicillin: Secondary | ICD-10-CM | POA: Insufficient documentation

## 2017-09-08 DIAGNOSIS — I10 Essential (primary) hypertension: Secondary | ICD-10-CM | POA: Insufficient documentation

## 2017-09-08 DIAGNOSIS — Z79899 Other long term (current) drug therapy: Secondary | ICD-10-CM | POA: Insufficient documentation

## 2017-09-08 DIAGNOSIS — Z7982 Long term (current) use of aspirin: Secondary | ICD-10-CM | POA: Diagnosis not present

## 2017-09-08 DIAGNOSIS — E785 Hyperlipidemia, unspecified: Secondary | ICD-10-CM | POA: Insufficient documentation

## 2017-09-08 DIAGNOSIS — M069 Rheumatoid arthritis, unspecified: Secondary | ICD-10-CM | POA: Diagnosis not present

## 2017-09-08 DIAGNOSIS — Z7984 Long term (current) use of oral hypoglycemic drugs: Secondary | ICD-10-CM | POA: Insufficient documentation

## 2017-09-08 DIAGNOSIS — I252 Old myocardial infarction: Secondary | ICD-10-CM | POA: Diagnosis not present

## 2017-09-08 DIAGNOSIS — K640 First degree hemorrhoids: Secondary | ICD-10-CM | POA: Diagnosis not present

## 2017-09-08 DIAGNOSIS — E119 Type 2 diabetes mellitus without complications: Secondary | ICD-10-CM | POA: Insufficient documentation

## 2017-09-08 HISTORY — DX: Acute myocardial infarction, unspecified: I21.9

## 2017-09-08 HISTORY — PX: COLONOSCOPY WITH PROPOFOL: SHX5780

## 2017-09-08 LAB — GLUCOSE, CAPILLARY: GLUCOSE-CAPILLARY: 114 mg/dL — AB (ref 65–99)

## 2017-09-08 SURGERY — COLONOSCOPY WITH PROPOFOL
Anesthesia: General

## 2017-09-08 MED ORDER — PROPOFOL 10 MG/ML IV BOLUS
INTRAVENOUS | Status: AC
  Filled 2017-09-08: qty 20

## 2017-09-08 MED ORDER — PROPOFOL 500 MG/50ML IV EMUL
INTRAVENOUS | Status: DC | PRN
  Administered 2017-09-08: 120 ug/kg/min via INTRAVENOUS

## 2017-09-08 MED ORDER — FENTANYL CITRATE (PF) 100 MCG/2ML IJ SOLN
INTRAMUSCULAR | Status: AC
  Filled 2017-09-08: qty 2

## 2017-09-08 MED ORDER — PROPOFOL 500 MG/50ML IV EMUL
INTRAVENOUS | Status: AC
  Filled 2017-09-08: qty 50

## 2017-09-08 MED ORDER — SODIUM CHLORIDE 0.9 % IV SOLN: INTRAVENOUS | Status: DC

## 2017-09-08 MED ORDER — SODIUM CHLORIDE 0.9 % IV SOLN
INTRAVENOUS | Status: DC
  Administered 2017-09-08: 1000 mL via INTRAVENOUS

## 2017-09-08 MED ORDER — FENTANYL CITRATE (PF) 100 MCG/2ML IJ SOLN
INTRAMUSCULAR | Status: DC | PRN
  Administered 2017-09-08 (×2): 25 ug via INTRAVENOUS
  Administered 2017-09-08: 50 ug via INTRAVENOUS

## 2017-09-08 MED ORDER — PROPOFOL 10 MG/ML IV BOLUS
INTRAVENOUS | Status: AC
Start: 2017-09-08 — End: ?
  Filled 2017-09-08: qty 20

## 2017-09-08 MED ORDER — MIDAZOLAM HCL 2 MG/2ML IJ SOLN
INTRAMUSCULAR | Status: AC
  Filled 2017-09-08: qty 2

## 2017-09-08 NOTE — Transfer of Care (Signed)
Immediate Anesthesia Transfer of Care Note  Patient: Misty Briggs  Procedure(s) Performed: COLONOSCOPY WITH PROPOFOL (N/A )  Patient Location: PACU  Anesthesia Type:General  Level of Consciousness: awake and sedated  Airway & Oxygen Therapy: Patient Spontanous Breathing and Patient connected to nasal cannula oxygen  Post-op Assessment: Report given to RN and Post -op Vital signs reviewed and stable  Post vital signs: Reviewed and stable  Last Vitals:  Vitals:   09/08/17 0713  BP: (!) 143/62  Pulse: 96  Resp: 20  Temp: (!) 36.1 C  SpO2: 100%    Last Pain: There were no vitals filed for this visit.       Complications: No apparent anesthesia complications

## 2017-09-08 NOTE — Anesthesia Postprocedure Evaluation (Signed)
Anesthesia Post Note  Patient: Misty Briggs  Procedure(s) Performed: COLONOSCOPY WITH PROPOFOL (N/A )  Patient location during evaluation: Endoscopy Anesthesia Type: General Level of consciousness: awake and alert Pain management: pain level controlled Vital Signs Assessment: post-procedure vital signs reviewed and stable Respiratory status: spontaneous breathing, nonlabored ventilation, respiratory function stable and patient connected to nasal cannula oxygen Cardiovascular status: blood pressure returned to baseline and stable Postop Assessment: no apparent nausea or vomiting Anesthetic complications: no     Last Vitals:  Vitals:   09/08/17 0850 09/08/17 0900  BP: 131/76 (!) 141/75  Pulse: 86 87  Resp: 20 17  Temp:    SpO2: 98% 99%    Last Pain: There were no vitals filed for this visit.               Cleda Mccreedy Piscitello

## 2017-09-08 NOTE — H&P (Signed)
Outpatient short stay form Pre-procedure 09/08/2017 7:37 AM Misty Deem MD  Primary Physician: Dr. [name]  Reason for visit:  Colonoscopy  History of present illness:  Patient is a 66 year old female presenting today as above. She has a personal history of adenomatous colon polyps. She takes 81 mg aspirin but no other blood thinning products or aspirin products. She did have some emesis with her prep however states she was clear this morning.    Current Facility-Administered Medications:  .  0.9 %  sodium chloride infusion, , Intravenous, Continuous, Misty Deem, MD, Last Rate: 20 mL/hr at 09/08/17 0727, 1,000 mL at 09/08/17 0727 .  0.9 %  sodium chloride infusion, , Intravenous, Continuous, Misty Deem, MD  Prescriptions Prior to Admission  Medication Sig Dispense Refill Last Dose  . montelukast (SINGULAIR) 10 MG tablet Take 10 mg by mouth at bedtime.   09/08/2017 at Unknown time  . telmisartan (MICARDIS) 20 MG tablet Take 20 mg by mouth daily.     Marland Kitchen albuterol (PROVENTIL HFA;VENTOLIN HFA) 108 (90 Base) MCG/ACT inhaler Inhale 2 puffs into the lungs every 6 (six) hours as needed for wheezing or shortness of breath.     Marland Kitchen aspirin EC 81 MG tablet Take by mouth.   Taking  . EPINEPHrine (EPI-PEN) 0.3 mg/0.3 mL DEVI Inject 0.3 mg into the muscle once.   Taking  . etodolac (LODINE) 400 MG tablet    Taking  . ferrous sulfate 325 (65 FE) MG tablet Take by mouth.   Taking  . fluticasone (FLONASE) 50 MCG/ACT nasal spray Place into the nose.   Taking  . folic acid (FOLVITE) 1 MG tablet    Taking  . furosemide (LASIX) 20 MG tablet Take by mouth.   Taking  . hydrochlorothiazide (HYDRODIURIL) 25 MG tablet Take 25 mg by mouth daily.   Taking  . levocetirizine (XYZAL) 5 MG tablet Take 5 mg by mouth every evening.   Taking  . lovastatin (MEVACOR) 20 MG tablet Take 20 mg by mouth at bedtime.   Taking  . Magnesium 200 MG TABS Take by mouth.   Taking  . metFORMIN  (GLUCOPHAGE) 1000 MG tablet    Taking  . methotrexate (RHEUMATREX) 2.5 MG tablet    Taking  . omeprazole (PRILOSEC) 20 MG capsule Take 20 mg by mouth daily.   Taking  . potassium chloride (K-DUR) 10 MEQ tablet Take by mouth.   Taking  . [DISCONTINUED] predniSONE (DELTASONE) 10 MG tablet as directed.    Taking     Allergies  Allergen Reactions  . Penicillins Hives, Palpitations and Rash  . Levofloxacin Other (See Comments)    Joint pain/muscle pain  . Sulfa Antibiotics Rash     Past Medical History:  Diagnosis Date  . Arthritis   . Asthma   . COPD (chronic obstructive pulmonary disease) (HCC)    Dr. Meredeth Ide, [facility] pulmonologist  . Diabetes mellitus   . GERD (gastroesophageal reflux disease)   . Heart murmur   . HLD (hyperlipidemia)   . Hypertension   . Myocardial infarction (HCC)    mild  . PONV (postoperative nausea and vomiting)    itching sometimes  . Rheumatoid arthritis(714.0)   . Shortness of breath     Review of systems:      Physical Exam    Heart and lungs: Regular rate and rhythm without rub or gallop, lungs are bilaterally clear    HEENT: Normocephalic atraumatic eyes are anicteric  Other:     Pertinant exam for procedure: Soft nontender nondistended bowel sounds positive normoactive.    Planned proceedures: Colonoscopy and indicated procedures. I have discussed the risks benefits and complications of procedures to include not limited to bleeding, infection, perforation and the risk of sedation and the patient wishes to proceed.    Misty Deem, MD Gastroenterology 09/08/2017  7:37 AM

## 2017-09-08 NOTE — Anesthesia Preprocedure Evaluation (Signed)
Anesthesia Evaluation  Patient identified by MRN, date of birth, ID band Patient awake    Reviewed: Allergy & Precautions, H&P , NPO status , Patient's Chart, lab work & pertinent test results  History of Anesthesia Complications (+) PONV and history of anesthetic complications  Airway Mallampati: III  TM Distance: <3 FB Neck ROM: limited    Dental  (+) Poor Dentition, Chipped   Pulmonary neg shortness of breath, asthma , COPD,           Cardiovascular Exercise Tolerance: Good hypertension, (-) angina+ Past MI  + Valvular Problems/Murmurs      Neuro/Psych negative neurological ROS  negative psych ROS   GI/Hepatic Neg liver ROS, GERD  Medicated and Controlled,  Endo/Other  diabetes, Well Controlled, Type 2  Renal/GU negative Renal ROS  negative genitourinary   Musculoskeletal  (+) Arthritis ,   Abdominal   Peds  Hematology negative hematology ROS (+)   Anesthesia Other Findings Past Medical History: No date: Arthritis No date: Asthma No date: COPD (chronic obstructive pulmonary disease) (HCC)     Comment:  Dr. Meredeth Ide, [facility] pulmonologist No date: Diabetes mellitus No date: GERD (gastroesophageal reflux disease) No date: Heart murmur No date: HLD (hyperlipidemia) No date: Hypertension No date: Myocardial infarction [facility])     Comment:  mild No date: PONV (postoperative nausea and vomiting)     Comment:  itching sometimes No date: Rheumatoid arthritis(714.0) No date: Shortness of breath  Past Surgical History: 05/05/2012: ANTERIOR CERVICAL DECOMP/DISCECTOMY FUSION     Comment:  Procedure: ANTERIOR CERVICAL DECOMPRESSION/DISCECTOMY               FUSION 1 LEVEL;  Surgeon: Karn Cassis, MD;                Location: [facility];  Service: Neurosurgery;                Laterality: N/A;  Cervical six seven Anterior cervical               decompression/diskectomy, Fusion, Plate No date:  ENDOMETRIAL ABLATION     Comment:  novasure 2015: FOOT SURGERY No date: ROTATOR CUFF REPAIR     Comment:  bilat 2010: TRANSTHORACIC ECHOCARDIOGRAM     Comment:  [facility] No date: TUBAL LIGATION  BMI    Body Mass Index:  25.82 kg/m      Reproductive/Obstetrics negative OB ROS                             Anesthesia Physical Anesthesia Plan  ASA: III  Anesthesia Plan: General   Post-op Pain Management:    Induction: Intravenous  PONV Risk Score and Plan:   Airway Management Planned: Natural Airway and Nasal Cannula  Additional Equipment:   Intra-op Plan:   Post-operative Plan:   Informed Consent: I have reviewed the patients History and Physical, chart, labs and discussed the procedure including the risks, benefits and alternatives for the proposed anesthesia with the patient or authorized representative who has indicated his/her understanding and acceptance.   Dental Advisory Given  Plan Discussed with: Anesthesiologist, CRNA and Surgeon  Anesthesia Plan Comments: (Patient consented for risks of anesthesia including but not limited to:  - adverse reactions to medications - risk of intubation if required - damage to teeth, lips or other oral mucosa - sore throat or hoarseness - Damage to heart, brain, lungs or loss of life  Patient  voiced understanding.)        Anesthesia Quick Evaluation

## 2017-09-08 NOTE — Anesthesia Post-op Follow-up Note (Signed)
Anesthesia QCDR form completed.        

## 2017-09-08 NOTE — Anesthesia Procedure Notes (Signed)
Performed by: Rosaria Ferries Pre-anesthesia Checklist: Patient identified, Emergency Drugs available, Suction available, Patient being monitored and Timeout performed Patient Re-evaluated:Patient Re-evaluated prior to induction Oxygen Delivery Method: Nasal cannula Preoxygenation: Pre-oxygenation with 100% oxygen Induction Type: IV induction Placement Confirmation: positive ETCO2 and CO2 detector

## 2017-09-08 NOTE — Op Note (Addendum)
[facility] Gastroenterology Patient Name: Misty Briggs Procedure Date: 09/08/2017 7:36 AM MRN: [id number] Account #: [id number] Date of Birth: 12-03-1950 Admit Type: Outpatient Age: 66 Room: [facility] ENDO ROOM 1 Gender: Female Note Status: Supervisor Override Procedure:            Colonoscopy Indications:          Personal history of colonic polyps Providers:            Christena Deem, MD Referring MD:         Rhona Leavens. Burnett Sheng, MD (Referring MD) Medicines:            Monitored Anesthesia Care Complications:        No immediate complications. Procedure:            Pre-Anesthesia Assessment:                       - ASA Grade Assessment: III - A patient with severe                        systemic disease.                       After obtaining informed consent, the colonoscope was                        passed under direct vision. Throughout the procedure,                        the patient's blood pressure, pulse, and oxygen                        saturations were monitored continuously. The                        Colonoscope was introduced through the anus and                        advanced to the the cecum, identified by appendiceal                        orifice and ileocecal valve. The colonoscopy was                        performed without difficulty. The patient tolerated the                        procedure well. The quality of the bowel preparation                        was good. Findings:      Two sessile polyps were found in the sigmoid colon. The polyps were less       than 1 mm in size. These polyps were removed with a cold biopsy forceps.       Resection and retrieval were complete.      Two sessile polyps were found in the rectum. The polyps were less than 1       mm in size. These polyps were removed with a cold biopsy forceps.       Resection and retrieval were complete.      A few small-mouthed diverticula were found in  the sigmoid colon.  Non-bleeding internal hemorrhoids were found during anoscopy. The       hemorrhoids were small and Grade I (internal hemorrhoids that do not       prolapse). Impression:           - Two less than 1 mm polyps in the sigmoid colon.                       - Two less than 1 mm polyps in the rectum.                       - Diverticulosis in the sigmoid colon.                       - Non-bleeding internal hemorrhoids. Recommendation:       - Await pathology results.                       - Telephone GI clinic for pathology results in 1 week. Procedure Code(s):    --- Professional ---                       [phone], Colonoscopy, flexible; with biopsy, single or                        multiple Diagnosis Code(s):    --- Professional ---                       D12.5, Benign neoplasm of sigmoid colon                       K62.1, Rectal polyp                       K64.0, First degree hemorrhoids                       Z86.010, Personal history of colonic polyps                       K57.30, Diverticulosis of large intestine without                        perforation or abscess without bleeding CPT copyright 2016 American Medical Association. All rights reserved. The codes documented in this report are preliminary and upon coder review may  be revised to meet current compliance requirements. Christena Deem, MD 09/08/2017 8:32:33 AM This report has been signed electronically. Number of Addenda: 0 Note Initiated On: 09/08/2017 7:36 AM Scope Withdrawal Time: 0 hours 11 minutes 55 seconds  Total Procedure Duration: 0 hours 40 minutes 50 seconds       [facility]

## 2017-09-09 LAB — SURGICAL PATHOLOGY

## 2017-09-13 ENCOUNTER — Encounter: Payer: Self-pay | Admitting: Gastroenterology

## 2017-09-22 ENCOUNTER — Ambulatory Visit
Admission: RE | Admit: 2017-09-22 | Discharge: 2017-09-22 | Disposition: A | Discharge status: Home / Self Care | Payer: 59 | Source: Ambulatory Visit | Attending: Obstetrics and Gynecology | Admitting: Obstetrics and Gynecology

## 2017-09-22 DIAGNOSIS — Z1231 Encounter for screening mammogram for malignant neoplasm of breast: Secondary | ICD-10-CM | POA: Insufficient documentation

## 2018-03-21 ENCOUNTER — Other Ambulatory Visit: Payer: Self-pay | Admitting: Obstetrics and Gynecology

## 2018-03-21 DIAGNOSIS — Z1231 Encounter for screening mammogram for malignant neoplasm of breast: Secondary | ICD-10-CM

## 2018-09-26 ENCOUNTER — Other Ambulatory Visit: Payer: Self-pay | Admitting: Obstetrics and Gynecology

## 2018-09-26 ENCOUNTER — Ambulatory Visit
Admission: RE | Admit: 2018-09-26 | Discharge: 2018-09-26 | Disposition: A | Discharge status: Home / Self Care | Payer: Medicare Other | Source: Ambulatory Visit | Attending: Obstetrics and Gynecology | Admitting: Obstetrics and Gynecology

## 2018-09-26 DIAGNOSIS — Z1231 Encounter for screening mammogram for malignant neoplasm of breast: Secondary | ICD-10-CM

## 2018-11-17 ENCOUNTER — Other Ambulatory Visit: Payer: Self-pay

## 2018-11-17 ENCOUNTER — Observation Stay
Admission: EM | Admit: 2018-11-17 | Discharge: 2018-11-19 | Disposition: A | Discharge status: Home / Self Care | Payer: Medicare Other | Attending: Internal Medicine | Admitting: Internal Medicine

## 2018-11-17 ENCOUNTER — Emergency Department: Payer: Medicare Other

## 2018-11-17 DIAGNOSIS — J209 Acute bronchitis, unspecified: Secondary | ICD-10-CM | POA: Insufficient documentation

## 2018-11-17 DIAGNOSIS — Z880 Allergy status to penicillin: Secondary | ICD-10-CM | POA: Insufficient documentation

## 2018-11-17 DIAGNOSIS — K219 Gastro-esophageal reflux disease without esophagitis: Secondary | ICD-10-CM | POA: Diagnosis not present

## 2018-11-17 DIAGNOSIS — R011 Cardiac murmur, unspecified: Secondary | ICD-10-CM | POA: Diagnosis not present

## 2018-11-17 DIAGNOSIS — Z7982 Long term (current) use of aspirin: Secondary | ICD-10-CM | POA: Diagnosis not present

## 2018-11-17 DIAGNOSIS — Z7951 Long term (current) use of inhaled steroids: Secondary | ICD-10-CM | POA: Insufficient documentation

## 2018-11-17 DIAGNOSIS — Z882 Allergy status to sulfonamides status: Secondary | ICD-10-CM | POA: Diagnosis not present

## 2018-11-17 DIAGNOSIS — I10 Essential (primary) hypertension: Secondary | ICD-10-CM | POA: Insufficient documentation

## 2018-11-17 DIAGNOSIS — E119 Type 2 diabetes mellitus without complications: Secondary | ICD-10-CM | POA: Insufficient documentation

## 2018-11-17 DIAGNOSIS — J440 Chronic obstructive pulmonary disease with acute lower respiratory infection: Secondary | ICD-10-CM | POA: Diagnosis not present

## 2018-11-17 DIAGNOSIS — T380X5A Adverse effect of glucocorticoids and synthetic analogues, initial encounter: Secondary | ICD-10-CM | POA: Insufficient documentation

## 2018-11-17 DIAGNOSIS — M069 Rheumatoid arthritis, unspecified: Secondary | ICD-10-CM | POA: Insufficient documentation

## 2018-11-17 DIAGNOSIS — J441 Chronic obstructive pulmonary disease with (acute) exacerbation: Secondary | ICD-10-CM | POA: Diagnosis present

## 2018-11-17 DIAGNOSIS — Z7984 Long term (current) use of oral hypoglycemic drugs: Secondary | ICD-10-CM | POA: Diagnosis not present

## 2018-11-17 DIAGNOSIS — I252 Old myocardial infarction: Secondary | ICD-10-CM | POA: Insufficient documentation

## 2018-11-17 DIAGNOSIS — E785 Hyperlipidemia, unspecified: Secondary | ICD-10-CM | POA: Diagnosis not present

## 2018-11-17 LAB — CBC WITH DIFFERENTIAL/PLATELET
Abs Immature Granulocytes: 0.03 10*3/uL (ref 0.00–0.07)
BASOS PCT: 0 %
Basophils Absolute: 0 10*3/uL (ref 0.0–0.1)
EOS PCT: 1 %
Eosinophils Absolute: 0.1 10*3/uL (ref 0.0–0.5)
HCT: 36.7 % (ref 36.0–46.0)
HEMOGLOBIN: 11.8 g/dL — AB (ref 12.0–15.0)
Immature Granulocytes: 0 %
Lymphocytes Relative: 18 %
Lymphs Abs: 1.7 10*3/uL (ref 0.7–4.0)
MCH: 30.5 pg (ref 26.0–34.0)
MCHC: 32.2 g/dL (ref 30.0–36.0)
MCV: 94.8 fL (ref 80.0–100.0)
MONO ABS: 0.5 10*3/uL (ref 0.1–1.0)
Monocytes Relative: 5 %
Neutro Abs: 7.1 10*3/uL (ref 1.7–7.7)
Neutrophils Relative %: 76 %
PLATELETS: 348 10*3/uL (ref 150–400)
RBC: 3.87 MIL/uL (ref 3.87–5.11)
RDW: 13.8 % (ref 11.5–15.5)
WBC: 9.4 10*3/uL (ref 4.0–10.5)
nRBC: 0 % (ref 0.0–0.2)

## 2018-11-17 LAB — COMPREHENSIVE METABOLIC PANEL
ALT: 23 U/L (ref 0–44)
AST: 22 U/L (ref 15–41)
Albumin: 3.7 g/dL (ref 3.5–5.0)
Alkaline Phosphatase: 129 U/L — ABNORMAL HIGH (ref 38–126)
Anion gap: 12 (ref 5–15)
BUN: 17 mg/dL (ref 8–23)
CO2: 30 mmol/L (ref 22–32)
CREATININE: 1.15 mg/dL — AB (ref 0.44–1.00)
Calcium: 9 mg/dL (ref 8.9–10.3)
Chloride: 97 mmol/L — ABNORMAL LOW (ref 98–111)
GFR calc Af Amer: 57 mL/min — ABNORMAL LOW (ref >60)
GFR, EST NON AFRICAN AMERICAN: 50 mL/min — AB (ref >60)
Glucose, Bld: 114 mg/dL — ABNORMAL HIGH (ref 70–99)
Potassium: 4 mmol/L (ref 3.5–5.1)
Sodium: 139 mmol/L (ref 135–145)
TOTAL PROTEIN: 6.7 g/dL (ref 6.5–8.1)
Total Bilirubin: 1.4 mg/dL — ABNORMAL HIGH (ref 0.3–1.2)

## 2018-11-17 LAB — TROPONIN I

## 2018-11-17 LAB — GLUCOSE, CAPILLARY: Glucose-Capillary: 349 mg/dL — ABNORMAL HIGH (ref 70–99)

## 2018-11-17 MED ORDER — BISACODYL 5 MG PO TBEC: 5.0000 mg | DELAYED_RELEASE_TABLET | Freq: Every day | ORAL | Status: DC | PRN

## 2018-11-17 MED ORDER — ASPIRIN EC 81 MG PO TBEC
81.0000 mg | DELAYED_RELEASE_TABLET | Freq: Every day | ORAL | Status: DC
  Administered 2018-11-18 – 2018-11-19 (×2): 81 mg via ORAL
  Filled 2018-11-17 (×2): qty 1

## 2018-11-17 MED ORDER — IRBESARTAN 75 MG PO TABS
75.0000 mg | ORAL_TABLET | Freq: Every day | ORAL | Status: DC
  Administered 2018-11-18 – 2018-11-19 (×2): 75 mg via ORAL
  Filled 2018-11-17 (×3): qty 1

## 2018-11-17 MED ORDER — ACETAMINOPHEN 650 MG RE SUPP: 650.0000 mg | Freq: Four times a day (QID) | RECTAL | Status: DC | PRN

## 2018-11-17 MED ORDER — METHYLPREDNISOLONE SODIUM SUCC 125 MG IJ SOLR
60.0000 mg | Freq: Two times a day (BID) | INTRAMUSCULAR | Status: DC
  Administered 2018-11-17 – 2018-11-18 (×2): 60 mg via INTRAVENOUS
  Filled 2018-11-17 (×2): qty 2

## 2018-11-17 MED ORDER — IPRATROPIUM-ALBUTEROL 0.5-2.5 (3) MG/3ML IN SOLN
6.0000 mL | Freq: Once | RESPIRATORY_TRACT | Status: AC
  Administered 2018-11-17: 6 mL via RESPIRATORY_TRACT
  Filled 2018-11-17: qty 6

## 2018-11-17 MED ORDER — ACETAMINOPHEN 325 MG PO TABS: 650.0000 mg | ORAL_TABLET | Freq: Four times a day (QID) | ORAL | Status: DC | PRN

## 2018-11-17 MED ORDER — ONDANSETRON HCL 4 MG PO TABS: 4.0000 mg | ORAL_TABLET | Freq: Four times a day (QID) | ORAL | Status: DC | PRN

## 2018-11-17 MED ORDER — TRAZODONE HCL 50 MG PO TABS: 25.0000 mg | ORAL_TABLET | Freq: Every evening | ORAL | Status: DC | PRN

## 2018-11-17 MED ORDER — PRAVASTATIN SODIUM 10 MG PO TABS
10.0000 mg | ORAL_TABLET | Freq: Every day | ORAL | Status: DC
  Administered 2018-11-17 – 2018-11-18 (×2): 10 mg via ORAL
  Filled 2018-11-17 (×3): qty 1

## 2018-11-17 MED ORDER — SODIUM CHLORIDE 0.9 % IV SOLN
INTRAVENOUS | Status: DC
  Administered 2018-11-17: 21:00:00 via INTRAVENOUS

## 2018-11-17 MED ORDER — MONTELUKAST SODIUM 10 MG PO TABS
10.0000 mg | ORAL_TABLET | Freq: Every day | ORAL | Status: DC
  Administered 2018-11-17 – 2018-11-18 (×2): 10 mg via ORAL
  Filled 2018-11-17 (×2): qty 1

## 2018-11-17 MED ORDER — ENOXAPARIN SODIUM 40 MG/0.4ML ~~LOC~~ SOLN
40.0000 mg | SUBCUTANEOUS | Status: DC
  Administered 2018-11-17 – 2018-11-18 (×2): 40 mg via SUBCUTANEOUS
  Filled 2018-11-17 (×2): qty 0.4

## 2018-11-17 MED ORDER — DOCUSATE SODIUM 100 MG PO CAPS
100.0000 mg | ORAL_CAPSULE | Freq: Two times a day (BID) | ORAL | Status: DC
  Administered 2018-11-17 – 2018-11-19 (×3): 100 mg via ORAL
  Filled 2018-11-17 (×4): qty 1

## 2018-11-17 MED ORDER — DEXAMETHASONE SODIUM PHOSPHATE 10 MG/ML IJ SOLN
10.0000 mg | Freq: Once | INTRAMUSCULAR | Status: AC
  Administered 2018-11-17: 10 mg via INTRAVENOUS
  Filled 2018-11-17: qty 1

## 2018-11-17 MED ORDER — FUROSEMIDE 40 MG PO TABS: 20.0000 mg | ORAL_TABLET | Freq: Every day | ORAL | Status: DC

## 2018-11-17 MED ORDER — METFORMIN HCL 500 MG PO TABS
1000.0000 mg | ORAL_TABLET | Freq: Two times a day (BID) | ORAL | Status: DC
  Administered 2018-11-17 – 2018-11-19 (×4): 1000 mg via ORAL
  Filled 2018-11-17 (×5): qty 2

## 2018-11-17 MED ORDER — BUDESONIDE 0.25 MG/2ML IN SUSP
0.2500 mg | Freq: Two times a day (BID) | RESPIRATORY_TRACT | Status: DC
  Administered 2018-11-17 – 2018-11-19 (×4): 0.25 mg via RESPIRATORY_TRACT
  Filled 2018-11-17 (×4): qty 2

## 2018-11-17 MED ORDER — IPRATROPIUM-ALBUTEROL 0.5-2.5 (3) MG/3ML IN SOLN
3.0000 mL | RESPIRATORY_TRACT | Status: DC
  Administered 2018-11-17 – 2018-11-18 (×7): 3 mL via RESPIRATORY_TRACT
  Filled 2018-11-17 (×7): qty 3

## 2018-11-17 MED ORDER — IPRATROPIUM-ALBUTEROL 0.5-2.5 (3) MG/3ML IN SOLN
3.0000 mL | Freq: Once | RESPIRATORY_TRACT | Status: AC
  Administered 2018-11-17: 3 mL via RESPIRATORY_TRACT
  Filled 2018-11-17: qty 3

## 2018-11-17 MED ORDER — HYDROCHLOROTHIAZIDE 25 MG PO TABS
25.0000 mg | ORAL_TABLET | Freq: Every day | ORAL | Status: DC
  Administered 2018-11-18 – 2018-11-19 (×2): 25 mg via ORAL
  Filled 2018-11-17 (×2): qty 1

## 2018-11-17 MED ORDER — ONDANSETRON HCL 4 MG/2ML IJ SOLN: 4.0000 mg | Freq: Four times a day (QID) | INTRAMUSCULAR | Status: DC | PRN

## 2018-11-17 MED ORDER — FERROUS SULFATE 325 (65 FE) MG PO TABS
325.0000 mg | ORAL_TABLET | Freq: Two times a day (BID) | ORAL | Status: DC
  Administered 2018-11-18 – 2018-11-19 (×3): 325 mg via ORAL
  Filled 2018-11-17 (×3): qty 1

## 2018-11-17 NOTE — ED Triage Notes (Signed)
Pt reports bronchitis for the past 3 weeks, states that she has been on doxycycline for a week and states that it isn't helping, pt states that she also completed 10 days worth of prednisone. Pt was seen at [facility] and sent here for further eval

## 2018-11-17 NOTE — H&P (Signed)
[facility] Physicians - [place] [facility]   PATIENT NAME: Misty Briggs    MR#:  [id number]  DATE OF BIRTH:  May 27, 1951  DATE OF ADMISSION:  11/17/2018  PRIMARY CARE PHYSICIAN: Jerl Mina, MD   REQUESTING/REFERRING PHYSICIAN: Dr. Derrill Kay  CHIEF COMPLAINT: Shortness of breath   Chief Complaint  Patient presents with  . Bronchitis    HISTORY OF PRESENT ILLNESS:  Misty Briggs  is a 67 y.o. female with a known history of COPD, rheumatoid arthritis, diabetes mellitus type 2 comes in because of worsening shortness of breath despite prolonged course of steroids for 10 days as an outpatient, also on doxycycline 100 mg p.o. twice daily for 1 week, today comes in because of worsening shortness of breath, cough.  Received series of nebs, steroids.  Ask you to admit the patient observation status because of COPD exacerbation, failed outpatient therapy.  Patient is not on oxygen, labs are largely within normal range.  Chest x-ray negative for pneumonia.  Patient not in distress at this time and able to talk full sentences.  PAST MEDICAL HISTORY:   Past Medical History:  Diagnosis Date  . Arthritis   . Asthma   . COPD (chronic obstructive pulmonary disease) (HCC)    Dr. Meredeth Ide, [facility] pulmonologist  . Diabetes mellitus   . GERD (gastroesophageal reflux disease)   . Heart murmur   . HLD (hyperlipidemia)   . Hypertension   . Myocardial infarction (HCC)    mild  . PONV (postoperative nausea and vomiting)    itching sometimes  . Rheumatoid arthritis(714.0)   . Shortness of breath     PAST SURGICAL HISTOIRY:   Past Surgical History:  Procedure Laterality Date  . ANTERIOR CERVICAL DECOMP/DISCECTOMY FUSION  05/05/2012   Procedure: ANTERIOR CERVICAL DECOMPRESSION/DISCECTOMY FUSION 1 LEVEL;  Surgeon: Karn Cassis, MD;  Location: [facility]S;  Service: Neurosurgery;  Laterality: N/A;  Cervical six seven Anterior cervical decompression/diskectomy,  Fusion, Plate  . COLONOSCOPY WITH PROPOFOL N/A 09/08/2017   Procedure: COLONOSCOPY WITH PROPOFOL;  Surgeon: Christena Deem, MD;  Location[facility] END[facility]COPY;  Service: Endoscopy;  Laterality: N/A;  . ENDOMETRIAL ABLATION     novasure  . FOOT SURGERY  2015  . ROTATOR CUFF REPAIR     bilat  . TRANSTHORACIC ECHOCARDIOGRAM  2010   [facility]  . TUBAL LIGATION      SOCIAL HISTORY:   Social History   Tobacco Use  . Smoking status: Never Smoker  . Smokeless tobacco: Never Used  Substance Use Topics  . Alcohol use: No    Alcohol/week: 0.0 standard drinks    FAMILY HISTORY:   Family History  Problem Relation Age of Onset  . Breast cancer Paternal Aunt     DRUG ALLERGIES:   Allergies  Allergen Reactions  . Penicillins Hives, Palpitations and Rash  . Levofloxacin Other (See Comments)    Joint pain/muscle pain  . Sulfa Antibiotics Rash    REVIEW OF SYSTEMS:  CONSTITUTIONAL: No fever, fatigue or weakness.  EYES: No blurred or double vision.  EARS, NOSE, AND THROAT: No tinnitus or ear pain.  RESPIRATORY: dry cough, shortness of breath, wheezing CARDIOVASCULAR: No chest pain, orthopnea, edema.  GASTROINTESTINAL: No nausea, vomiting, diarrhea or abdominal pain.  GENITOURINARY: No dysuria, hematuria.  ENDOCRINE: No polyuria, nocturia,  HEMATOLOGY: No anemia, easy bruising or bleeding SKIN: No rash or lesion. MUSCULOSKELETAL: No joint pain or arthritis.   NEUROLOGIC: No tingling, numbness, weakness.  PSYCHIATRY: No  anxiety or depression.   MEDICATIONS AT HOME:   Prior to Admission medications   Medication Sig Start Date End Date Taking? Authorizing Provider  albuterol (PROVENTIL HFA;VENTOLIN HFA) 108 (90 Base) MCG/ACT inhaler Inhale 2 puffs into the lungs every 6 (six) hours as needed for wheezing or shortness of breath.    [provider]  aspirin EC 81 MG tablet Take by mouth.    [provider]  EPINEPHrine (EPI-PEN) 0.3 mg/0.3 mL DEVI  Inject 0.3 mg into the muscle once.    [provider]  etodolac (LODINE) 400 MG tablet  05/02/15   [provider]  ferrous sulfate 325 (65 FE) MG tablet Take by mouth.    [provider]  fluticasone (FLONASE) 50 MCG/ACT nasal spray Place into the nose.    [provider]  folic acid (FOLVITE) 1 MG tablet  05/02/15   [provider]  furosemide (LASIX) 20 MG tablet Take by mouth. 07/01/15 06/30/16  [provider]  hydrochlorothiazide (HYDRODIURIL) 25 MG tablet Take 25 mg by mouth daily.    [provider]  levocetirizine (XYZAL) 5 MG tablet Take 5 mg by mouth every evening.    [provider]  lovastatin (MEVACOR) 20 MG tablet Take 20 mg by mouth at bedtime.    [provider]  Magnesium 200 MG TABS Take by mouth.    [provider]  metFORMIN (GLUCOPHAGE) 1000 MG tablet  05/02/15   [provider]  methotrexate (RHEUMATREX) 2.5 MG tablet  05/02/15   [provider]  montelukast (SINGULAIR) 10 MG tablet Take 10 mg by mouth at bedtime.    [provider]  omeprazole (PRILOSEC) 20 MG capsule Take 20 mg by mouth daily.    [provider]  potassium chloride (K-DUR) 10 MEQ tablet Take by mouth. 07/21/15 07/20/16  [provider]  telmisartan (MICARDIS) 20 MG tablet Take 20 mg by mouth daily.    [provider]      VITAL SIGNS:  Blood pressure 140/62, pulse (!) 101, temperature 98.7 F (37.1 C), temperature source Oral, height 5\' 6"  (1.676 m), weight 72.1 kg, SpO2 99 %.  PHYSICAL EXAMINATION:  GENERAL:  67 y.o.-year-old patient lying in the bed with no acute distress.  EYES: Pupils equal, round, reactive to light and accommodation. No scleral icterus. Extraocular muscles intact.  HEENT: Head atraumatic, normocephalic. Oropharynx and nasopharynx clear.  NECK:  Supple, no jugular venous distention. No thyroid enlargement, no tenderness.  LUNGS: Faint  expiratory wheeze in all lung fields.   CARDIOVASCULAR: S1, S2 normal. No murmurs, rubs, or gallops.  ABDOMEN: Soft, nontender, nondistended. Bowel sounds present. No organomegaly or mass.  EXTREMITIES: No pedal edema, cyanosis, or clubbing.  NEUROLOGIC: Cranial nerves II through XII are intact. Muscle strength 5/5 in all extremities. Sensation intact. Gait not checked.  PSYCHIATRIC: The patient is alert and oriented x 3.  SKIN: No obvious rash, lesion, or ulcer.   LABORATORY PANEL:   CBC Recent Labs  Lab 11/17/18 1551  WBC 9.4  HGB 11.8*  HCT 36.7  PLT 348   ------------------------------------------------------------------------------------------------------------------  Chemistries  Recent Labs  Lab 11/17/18 1551  NA 139  K 4.0  CL 97*  CO2 30  GLUCOSE 114*  BUN 17  CREATININE 1.15*  CALCIUM 9.0  AST 22  ALT 23  ALKPHOS 129*  BILITOT 1.4*   ------------------------------------------------------------------------------------------------------------------  Cardiac Enzymes No results for input(s): TROPONINI in the last 168 hours. ------------------------------------------------------------------------------------------------------------------  RADIOLOGY:  Dg Chest  2 View  Result Date: 11/17/2018 CLINICAL DATA:  Cough EXAM: CHEST - 2 VIEW COMPARISON:  May 02, 2012 FINDINGS: No edema or consolidation. The heart size and pulmonary vascularity are normal. No adenopathy. There is postoperative change in the lower cervical spine. There is degenerative change in the lower thoracic spine. IMPRESSION: No edema or consolidation. Electronically Signed   By: Bretta Bang III M.D.   On: 11/17/2018 15:16    EKG:   Orders placed or performed during the hospital encounter of 11/17/18  . ED EKG  . ED EKG  . EKG 12-Lead  . EKG 12-Lead    IMPRESSION AND PLAN:   67 year old female patient with history of essential hypertension, diabetes mellitus type 2, rheumatoid  arthritis, recent acute bronchitis comes in because of worsening shortness of breath, wheezing, patient recently took 2 weeks course of steroid and also on board doxycycline now.  Patient allergic to multiple antibiotics. 1.  COPD exacerbation: Failed outpatient therapy, minimal wheezing during my visit, admit observation status, continue bronchodilators, IV steroids, Pulmicort nebs, possible discharge home tomorrow. 2.  Slightly elevated white count likely due to prednisone.  Patient can finish her doxycycline.  Patient still has 3 more days worth of doxycycline 100 mg p.o. twice daily advised the patient will order that and finish the doxycycline for 3 days. 3.  Diabetes mellitus type 2: Continue metformin 1 g p.o. twice daily 4.  History of essential hypertension, patient is on HCTZ, she said that she is not on Lasix anymore. 5.  History of rheumatoid arthritis: Patient is on methotrexate once a week on Wednesday.    All the records are reviewed and case discussed with ED provider. Management plans discussed with the patient, family and they are in agreement.  CODE STATUS: full code  TOTAL TIME TAKING CARE OF THIS PATIENT: .    Katha Hamming M.D on 11/17/2018 at 5:59 PM  Between 7am to 6pm - Pager - [phone]  After 6pm go to [email or web address] - password [email or web address]  [facility]  Office  [phone]  CC: Primary care physician; Jerl Mina, MD  Note: This dictation was prepared with Dragon dictation along with smaller phrase technology. Any transcriptional errors that result from this process are unintentional.

## 2018-11-17 NOTE — ED Notes (Signed)
See triage note  States she has had cough and bronchitis sx's for about 2 weeks  Has been on prednisone and doxy w/o relief  Afebrile on arrival

## 2018-11-17 NOTE — Plan of Care (Signed)
  Problem: Health Behavior/Discharge Planning: Goal: Ability to manage health-related needs will improve Outcome: Progressing   Problem: Clinical Measurements: Goal: Ability to maintain clinical measurements within normal limits will improve Outcome: Progressing Goal: Will remain free from infection Outcome: Progressing Goal: Respiratory complications will improve Outcome: Progressing   Problem: Activity: Goal: Risk for activity intolerance will decrease Outcome: Progressing   Problem: Nutrition: Goal: Adequate nutrition will be maintained Outcome: Progressing   Problem: Coping: Goal: Level of anxiety will decrease Outcome: Progressing   Problem: Elimination: Goal: Will not experience complications related to bowel motility Outcome: Progressing   Problem: Pain Managment: Goal: General experience of comfort will improve Outcome: Progressing   Problem: Safety: Goal: Ability to remain free from injury will improve Outcome: Progressing   

## 2018-11-17 NOTE — ED Provider Notes (Signed)
[facility] Emergency Department Provider Note  ____________________________________________  Time seen: Approximately 3:22 PM  I have reviewed the triage vital signs and the nursing notes.   HISTORY  Chief Complaint Bronchitis    HPI Misty Briggs is a 67 y.o. female who presents the emergency department complaining of shortness of breath, wheezing, chest tightness, coughing, COPD exacerbation.  Patient reports that over the past 3 weeks, she has been having shortness of breath, wheezing, cough.  Patient has been seen twice by her primary care as well as once at acute care walk-in clinic.  Patient reports that she has been on steroids for over 2 weeks, she is undergone a round of antibiotics.  Patient reports that these medications are not alleviating her symptoms at all.  Patient is using Advair as well as rescue inhalers.  She states that she takes Advair both morning and night as well as using her rescue inhaler 2-3 times a day.  Patient denies any fevers or chills, nasal congestion, chest pain, abdominal pain, nausea or vomiting.  Patient does have shortness of breath, mild sore throat from coughing.  In addition to steroids, inhalers,  she has been taking cough medications as well.  Patient does have a history of COPD/asthma, arthritis, history of previous MI, hypertension, GERD, diabetes.   Past Medical History:  Diagnosis Date  . Arthritis   . Asthma   . COPD (chronic obstructive pulmonary disease) (HCC)    Dr. Meredeth Ide, [facility] pulmonologist  . Diabetes mellitus   . GERD (gastroesophageal reflux disease)   . Heart murmur   . HLD (hyperlipidemia)   . Hypertension   . Myocardial infarction (HCC)    mild  . PONV (postoperative nausea and vomiting)    itching sometimes  . Rheumatoid arthritis(714.0)   . Shortness of breath     Patient Active Problem List   Diagnosis Date Noted  . COPD exacerbation (HCC) 11/17/2018  . Asthma  without status asthmaticus 09/04/2015  . Arthritis, degenerative 09/04/2015  . Essential (primary) hypertension 07/01/2015  . Gastro-esophageal reflux disease without esophagitis 07/01/2015  . Combined fat and carbohydrate induced hyperlipemia 07/01/2015  . Rheumatoid arthritis involving multiple joints (HCC) 09/10/2014    Past Surgical History:  Procedure Laterality Date  . ANTERIOR CERVICAL DECOMP/DISCECTOMY FUSION  05/05/2012   Procedure: ANTERIOR CERVICAL DECOMPRESSION/DISCECTOMY FUSION 1 LEVEL;  Surgeon: Karn Cassis, MD;  Location: [facility] NEURO ORS;  Service: Neurosurgery;  Laterality: N/A;  Cervical six seven Anterior cervical decompression/diskectomy, Fusion, Plate  . COLONOSCOPY WITH PROPOFOL N/A 09/08/2017   Procedure: COLONOSCOPY WITH PROPOFOL;  Surgeon: Christena Deem, MD;  Location: [facility] ENDOSCOPY;  Service: Endoscopy;  Laterality: N/A;  . ENDOMETRIAL ABLATION     novasure  . FOOT SURGERY  2015  . ROTATOR CUFF REPAIR     bilat  . TRANSTHORACIC ECHOCARDIOGRAM  2010   [facility]  . TUBAL LIGATION      Prior to Admission medications   Medication Sig Start Date End Date Taking? Authorizing Provider  albuterol (PROVENTIL HFA;VENTOLIN HFA) 108 (90 Base) MCG/ACT inhaler Inhale 2 puffs into the lungs every 6 (six) hours as needed for wheezing or shortness of breath.    [provider]  aspirin EC 81 MG tablet Take by mouth.    [provider]  EPINEPHrine (EPI-PEN) 0.3 mg/0.3 mL DEVI Inject 0.3 mg into the muscle once.    [provider]  etodolac (LODINE) 400 MG tablet  05/02/15   [provider]  ferrous sulfate 325 (65 FE) MG tablet Take by mouth.    [provider]  fluticasone (FLONASE) 50 MCG/ACT nasal spray Place into the nose.    [provider]  folic acid (FOLVITE) 1 MG tablet  05/02/15   [provider]  furosemide (LASIX) 20 MG tablet Take by mouth. 07/01/15 06/30/16  [provider]   hydrochlorothiazide (HYDRODIURIL) 25 MG tablet Take 25 mg by mouth daily.    [provider]  levocetirizine (XYZAL) 5 MG tablet Take 5 mg by mouth every evening.    [provider]  lovastatin (MEVACOR) 20 MG tablet Take 20 mg by mouth at bedtime.    [provider]  Magnesium 200 MG TABS Take by mouth.    [provider]  metFORMIN (GLUCOPHAGE) 1000 MG tablet  05/02/15   [provider]  methotrexate (RHEUMATREX) 2.5 MG tablet  05/02/15   [provider]  montelukast (SINGULAIR) 10 MG tablet Take 10 mg by mouth at bedtime.    [provider]  omeprazole (PRILOSEC) 20 MG capsule Take 20 mg by mouth daily.    [provider]  potassium chloride (K-DUR) 10 MEQ tablet Take by mouth. 07/21/15 07/20/16  [provider]  telmisartan (MICARDIS) 20 MG tablet Take 20 mg by mouth daily.    [provider]    Allergies Penicillins; Levofloxacin; and Sulfa antibiotics  Family History  Problem Relation Age of Onset  . Breast cancer Paternal Aunt     Social History Social History   Tobacco Use  . Smoking status: Never Smoker  . Smokeless tobacco: Never Used  Substance Use Topics  . Alcohol use: No    Alcohol/week: 0.0 standard drinks  . Drug use: No     Review of Systems  Constitutional: No fever/chills Eyes: No visual changes. No discharge ENT: No upper respiratory complaints. Cardiovascular: no chest pain. Respiratory: Positive cough.  Positive SOB.  Positive for wheezing. Gastrointestinal: No abdominal pain.  No nausea, no vomiting.  No diarrhea.  No constipation. Musculoskeletal: Negative for musculoskeletal pain. Skin: Negative for rash, abrasions, lacerations, ecchymosis. Neurological: Negative for headaches, focal weakness or numbness. 10-point ROS otherwise negative.  ____________________________________________   PHYSICAL EXAM:  VITAL SIGNS: ED Triage Vitals  Enc Vitals Group      BP 11/17/18 1436 140/62     Pulse Rate 11/17/18 1436 (!) 101     Resp --      Temp 11/17/18 1436 98.7 F (37.1 C)     Temp Source 11/17/18 1436 Oral     SpO2 11/17/18 1436 99 %     Weight 11/17/18 1437 159 lb (72.1 kg)     Height 11/17/18 1437 5\' 6"  (1.676 m)     Head Circumference --      Peak Flow --      Pain Score 11/17/18 1444 0     Pain Loc --      Pain Edu? --      Excl. in GC? --      Constitutional: Alert and oriented. Well appearing and in no acute distress. Eyes: Conjunctivae are normal. PERRL. EOMI. Head: Atraumatic. ENT:      Ears: EACs with cerumen bilaterally.  TMs are unremarkable bilaterally.      Nose: No congestion/rhinnorhea.      Mouth/Throat: Mucous membranes are moist.  Corners of the mouth with fissuring.  Tongue with some signs of thrush.  Patient reports that this is been present since  antibiotic use several months ago Neck: No stridor.   Hematological/Lymphatic/Immunilogical: No cervical lymphadenopathy. Cardiovascular: Normal rate, regular rhythm. Normal S1 and S2.  Good peripheral circulation. Respiratory: Normal respiratory effort without tachypnea or retractions. Lungs with inspiratory and expiratory wheezing in the bilateral lower lung fields.Peri Jefferson air entry to the bases with no decreased or absent breath sounds. Musculoskeletal: Full range of motion to all extremities. No gross deformities appreciated. Neurologic:  Normal speech and language. No gross focal neurologic deficits are appreciated.  Skin:  Skin is warm, dry and intact. No rash noted. Psychiatric: Mood and affect are normal. Speech and behavior are normal. Patient exhibits appropriate insight and judgement.   ____________________________________________   LABS (all labs ordered are listed, but only abnormal results are displayed)  Labs Reviewed  COMPREHENSIVE METABOLIC PANEL - Abnormal; Notable for the following components:      Result Value   Chloride 97 (*)    Glucose, Bld 114  (*)    Creatinine, Ser 1.15 (*)    Alkaline Phosphatase 129 (*)    Total Bilirubin 1.4 (*)    GFR calc non Af Amer 50 (*)    GFR calc Af Amer 57 (*)    All other components within normal limits  CBC WITH DIFFERENTIAL/PLATELET - Abnormal; Notable for the following components:   Hemoglobin 11.8 (*)    All other components within normal limits  TROPONIN I  HIV ANTIBODY (ROUTINE TESTING W REFLEX)  BASIC METABOLIC PANEL  CBC   ____________________________________________  EKG  ED ECG REPORT I, Delorise Royals Cuthriell,  personally viewed and interpreted this ECG.   Date: 11/17/2018  EKG Time: 1704 hrs.  Rate: 99 bpm  Rhythm: normal EKG, normal sinus rhythm, unchanged from previous tracings  Axis: Left axis deviation  Intervals:none  ST&T Change: No ST elevation or depression noted.  No STEMI.  Normal sinus rhythm.  ____________________________________________  RADIOLOGY I personally viewed and evaluated these images as part of my medical decision making, as well as reviewing the written report by the radiologist.  Dg Chest 2 View  Result Date: 11/17/2018 CLINICAL DATA:  Cough EXAM: CHEST - 2 VIEW COMPARISON:  May 02, 2012 FINDINGS: No edema or consolidation. The heart size and pulmonary vascularity are normal. No adenopathy. There is postoperative change in the lower cervical spine. There is degenerative change in the lower thoracic spine. IMPRESSION: No edema or consolidation. Electronically Signed   By: Bretta Bang III M.D.   On: 11/17/2018 15:16    ____________________________________________    PROCEDURES  Procedure(s) performed:    Procedures    Medications  aspirin EC tablet 81 mg (has no administration in time range)  pravastatin (PRAVACHOL) tablet 10 mg (has no administration in time range)  irbesartan (AVAPRO) tablet 75 mg (has no administration in time range)  0.9 %  sodium chloride infusion (has no administration in time range)  acetaminophen  (TYLENOL) tablet 650 mg (has no administration in time range)    Or  acetaminophen (TYLENOL) suppository 650 mg (has no administration in time range)  traZODone (DESYREL) tablet 25 mg (has no administration in time range)  docusate sodium (COLACE) capsule 100 mg (has no administration in time range)  bisacodyl (DULCOLAX) EC tablet 5 mg (has no administration in time range)  ondansetron (ZOFRAN) tablet 4 mg (has no administration in time range)    Or  ondansetron (ZOFRAN) injection 4 mg (has no administration in time range)  enoxaparin (LOVENOX) injection 40 mg (has no administration in time  range)  methylPREDNISolone sodium succinate (SOLU-MEDROL) 125 mg/2 mL injection 60 mg (has no administration in time range)  ipratropium-albuterol (DUONEB) 0.5-2.5 (3) MG/3ML nebulizer solution 3 mL (has no administration in time range)  montelukast (SINGULAIR) tablet 10 mg (has no administration in time range)  metFORMIN (GLUCOPHAGE) tablet 1,000 mg (has no administration in time range)  ferrous sulfate tablet 325 mg (has no administration in time range)  hydrochlorothiazide (HYDRODIURIL) tablet 25 mg (has no administration in time range)  budesonide (PULMICORT) nebulizer solution 0.25 mg (has no administration in time range)  dexamethasone (DECADRON) injection 10 mg (10 mg Intravenous Given 11/17/18 1552)  ipratropium-albuterol (DUONEB) 0.5-2.5 (3) MG/3ML nebulizer solution 6 mL (6 mLs Nebulization Given 11/17/18 1552)  ipratropium-albuterol (DUONEB) 0.5-2.5 (3) MG/3ML nebulizer solution 3 mL (3 mLs Nebulization Given 11/17/18 1708)     ____________________________________________   INITIAL IMPRESSION / ASSESSMENT AND PLAN / ED COURSE  Pertinent labs & imaging results that were available during my care of the patient were reviewed by me and considered in my medical decision making (see chart for details).  Review of the [place] CSRS was performed in accordance of the [facility] prior to dispensing any  controlled drugs.      Patient's diagnosis is consistent with COPD exacerbation.  Patient presents the emergency department with 3 weeks of symptoms.  Patient has endorsed cough, wheezing, shortness of breath, chest tightness.  Patient has a known diagnosis is COPD.  Patient has been seen by her primary care twice in the past 3 weeks.  She has had 2 weeks of prednisone, 1 week of doxycycline.  Symptoms have not improved.  Patient has also been changed from daily Advair to twice daily Advair and has been using her albuterol inhaler approximately 3 times a day for rescue relief.  Patient was referred to the emergency department for evaluation for ongoing symptoms.  On initial exam, patient had no increased work of breathing but did have bilateral wheezing.  Patient endorsed chest tightness and mild shortness of breath at this time.  After IV steroid, 3 DuoNeb treatments, adventitious lung sounds have improved, however patient still endorses chest tightness and shortness of breath.  Negative chest x-ray for any indication of pneumonia.  Labs are at baseline after review of recent labs from [facility].  EKG reveals normal sinus rhythm.  Given failed outpatient treatment, no significant improvement in symptoms with IV steroid and DuoNeb treatments, I discussed the case with hospitalist for admission.  They agree that patient is suitable for admission.  Patient care will be transferred to hospitalist service for further management.      ____________________________________________  FINAL CLINICAL IMPRESSION(S) / ED DIAGNOSES  Final diagnoses:  COPD exacerbation (HCC)      NEW MEDICATIONS STARTED DURING THIS VISIT:  ED Discharge Orders    None          This chart was dictated using voice recognition software/Dragon. Despite best efforts to proofread, errors can occur which can change the meaning. Any change was purely unintentional.    Racheal Patches, PA-C 11/17/18 1759     Willy Eddy, MD 11/17/18 1901

## 2018-11-18 DIAGNOSIS — J440 Chronic obstructive pulmonary disease with acute lower respiratory infection: Secondary | ICD-10-CM | POA: Diagnosis not present

## 2018-11-18 LAB — BASIC METABOLIC PANEL
Anion gap: 12 (ref 5–15)
BUN: 25 mg/dL — ABNORMAL HIGH (ref 8–23)
CHLORIDE: 104 mmol/L (ref 98–111)
CO2: 24 mmol/L (ref 22–32)
CREATININE: 1.46 mg/dL — AB (ref 0.44–1.00)
Calcium: 9.4 mg/dL (ref 8.9–10.3)
GFR calc Af Amer: 43 mL/min — ABNORMAL LOW (ref >60)
GFR calc non Af Amer: 37 mL/min — ABNORMAL LOW (ref >60)
Glucose, Bld: 252 mg/dL — ABNORMAL HIGH (ref 70–99)
Potassium: 3.9 mmol/L (ref 3.5–5.1)
Sodium: 140 mmol/L (ref 135–145)

## 2018-11-18 LAB — CBC
HCT: 37 % (ref 36.0–46.0)
Hemoglobin: 11.8 g/dL — ABNORMAL LOW (ref 12.0–15.0)
MCH: 30.3 pg (ref 26.0–34.0)
MCHC: 31.9 g/dL (ref 30.0–36.0)
MCV: 95.1 fL (ref 80.0–100.0)
Platelets: 385 10*3/uL (ref 150–400)
RBC: 3.89 MIL/uL (ref 3.87–5.11)
RDW: 13.6 % (ref 11.5–15.5)
WBC: 6.3 10*3/uL (ref 4.0–10.5)
nRBC: 0 % (ref 0.0–0.2)

## 2018-11-18 LAB — GLUCOSE, CAPILLARY: Glucose-Capillary: 238 mg/dL — ABNORMAL HIGH (ref 70–99)

## 2018-11-18 MED ORDER — ALUM & MAG HYDROXIDE-SIMETH 200-200-20 MG/5ML PO SUSP
30.0000 mL | Freq: Four times a day (QID) | ORAL | Status: DC | PRN
  Administered 2018-11-18: 30 mL via ORAL
  Filled 2018-11-18: qty 30

## 2018-11-18 MED ORDER — PREDNISONE 50 MG PO TABS
50.0000 mg | ORAL_TABLET | Freq: Every day | ORAL | Status: DC
  Administered 2018-11-19: 50 mg via ORAL
  Filled 2018-11-18: qty 1

## 2018-11-18 MED ORDER — IPRATROPIUM-ALBUTEROL 0.5-2.5 (3) MG/3ML IN SOLN
3.0000 mL | Freq: Three times a day (TID) | RESPIRATORY_TRACT | Status: DC
  Administered 2018-11-19: 3 mL via RESPIRATORY_TRACT
  Filled 2018-11-18: qty 3

## 2018-11-18 MED ORDER — PANTOPRAZOLE SODIUM 40 MG PO TBEC
40.0000 mg | DELAYED_RELEASE_TABLET | Freq: Every day | ORAL | Status: DC
  Administered 2018-11-18 – 2018-11-19 (×2): 40 mg via ORAL
  Filled 2018-11-18 (×2): qty 1

## 2018-11-18 MED ORDER — IPRATROPIUM-ALBUTEROL 0.5-2.5 (3) MG/3ML IN SOLN: 3.0000 mL | RESPIRATORY_TRACT | Status: DC | PRN

## 2018-11-18 NOTE — Care Management Obs Status (Signed)
MEDICARE OBSERVATION STATUS NOTIFICATION   Patient Details  Name: VERNECIA UMBLE MRN: [id number] Date of Birth: 1951-05-02   Medicare Observation Status Notification Given:  Yes    Edwinna Areola Marisabel Macpherson, RN 11/18/2018, 9:39 AM

## 2018-11-18 NOTE — Progress Notes (Signed)
[facility] [place] [facility]   PATIENT NAME: Misty Briggs    MR#:  [id number]  DATE OF BIRTH:  05-07-1951  SUBJECTIVE:  CHIEF COMPLAINT:   Chief Complaint  Patient presents with  . Bronchitis  Patient seen and evaluated today Has wheezing and shortness of breath Has cough which is dry in nature No complaints of chest pain  REVIEW OF SYSTEMS:    ROS  CONSTITUTIONAL: No documented fever. No fatigue, weakness. No weight gain, no weight loss.  EYES: No blurry or double vision.  ENT: No tinnitus. No postnasal drip. No redness of the oropharynx.  RESPIRATORY: Has cough, has wheeze, no hemoptysis. Has dyspnea.  CARDIOVASCULAR: No chest pain. No orthopnea. No palpitations. No syncope.  GASTROINTESTINAL: No nausea, no vomiting or diarrhea. No abdominal pain. No melena or hematochezia.  GENITOURINARY: No dysuria or hematuria.  ENDOCRINE: No polyuria or nocturia. No heat or cold intolerance.  HEMATOLOGY: No anemia. No bruising. No bleeding.  INTEGUMENTARY: No rashes. No lesions.  MUSCULOSKELETAL: No arthritis. No swelling. No gout.  NEUROLOGIC: No numbness, tingling, or ataxia. No seizure-type activity.  PSYCHIATRIC: No anxiety. No insomnia. No ADD.   DRUG ALLERGIES:   Allergies  Allergen Reactions  . Penicillins Hives, Palpitations and Rash  . Levofloxacin Other (See Comments)    Joint pain/muscle pain  . Sulfa Antibiotics Rash    VITALS:  Blood pressure (!) 122/53, pulse (!) 109, temperature 98.1 F (36.7 C), resp. rate 14, height 5\' 6"  (1.676 m), weight 72.1 kg, SpO2 95 %.  PHYSICAL EXAMINATION:   Physical Exam  GENERAL:  67 y.o.-year-old patient lying in the bed with no acute distress.  EYES: Pupils equal, round, reactive to light and accommodation. No scleral icterus. Extraocular muscles intact.  HEENT: Head atraumatic, normocephalic. Oropharynx and nasopharynx clear.  NECK:  Supple, no jugular venous distention. No thyroid enlargement, no  tenderness.  LUNGS: Decreased breath sounds bilaterally, bilateral wheezing. No use of accessory muscles of respiration.  CARDIOVASCULAR: S1, S2 normal. No murmurs, rubs, or gallops.  ABDOMEN: Soft, nontender, nondistended. Bowel sounds present. No organomegaly or mass.  EXTREMITIES: No cyanosis, clubbing or edema b/l.    NEUROLOGIC: Cranial nerves II through XII are intact. No focal Motor or sensory deficits b/l.   PSYCHIATRIC: The patient is alert and oriented x 3.  SKIN: No obvious rash, lesion, or ulcer.   LABORATORY PANEL:   CBC Recent Labs  Lab 11/18/18 0455  WBC 6.3  HGB 11.8*  HCT 37.0  PLT 385   ------------------------------------------------------------------------------------------------------------------ Chemistries  Recent Labs  Lab 11/17/18 1551 11/18/18 0455  NA 139 140  K 4.0 3.9  CL 97* 104  CO2 30 24  GLUCOSE 114* 252*  BUN 17 25*  CREATININE 1.15* 1.46*  CALCIUM 9.0 9.4  AST 22  --   ALT 23  --   ALKPHOS 129*  --   BILITOT 1.4*  --    ------------------------------------------------------------------------------------------------------------------  Cardiac Enzymes Recent Labs  Lab 11/17/18 1855  TROPONINI <0.03   ------------------------------------------------------------------------------------------------------------------  RADIOLOGY:  Dg Chest 2 View  Result Date: 11/17/2018 CLINICAL DATA:  Cough EXAM: CHEST - 2 VIEW COMPARISON:  May 02, 2012 FINDINGS: No edema or consolidation. The heart size and pulmonary vascularity are normal. No adenopathy. There is postoperative change in the lower cervical spine. There is degenerative change in the lower thoracic spine. IMPRESSION: No edema or consolidation. Electronically Signed   By: May 04, 2012 III M.D.   On: 11/17/2018 15:16  ASSESSMENT AND PLAN:   67 year old female patient with history of hypertension, type 2 diabetes mellitus, rheumatoid arthritis presented to the emergency  room for worsening shortness of breath and cough  -Acute COPD exacerbation Patient failed outpatient therapy Start oral prednisone and discontinue IV Solu-Medrol and continue nebulization treatments aggressively Continue doxycycline antibiotic Antitussive medication Wean oxygen  -Leukocytosis secondary to steroids  -Acute on chronic bronchitis Continue doxycycline antibiotic to finish the course  -Type 2 diabetes mellitus Continue oral metformin  -History of rheumatoid arthritis Continue methotrexate once a week  All the records are reviewed and case discussed with Care Management/Social Worker. Management plans discussed with the patient, family and they are in agreement.  CODE STATUS: Full code  DVT Prophylaxis: SCDs  TOTAL TIME TAKING CARE OF THIS PATIENT : 35 minutes.   POSSIBLE D/C IN 1  DAYS, DEPENDING ON CLINICAL CONDITION.  Ihor Austin M.D on 11/18/2018 at 1:20 PM  Between 7am to 6pm - Pager - [phone]  After 6pm go to [email or web address] - password [email or web address]  [facility]  Office  [phone]  CC: Primary care physician; Jerl Mina, MD  Note: This dictation was prepared with Dragon dictation along with smaller phrase technology. Any transcriptional errors that result from this process are unintentional.

## 2018-11-18 NOTE — Plan of Care (Signed)
  Problem: Clinical Measurements: Goal: Ability to maintain clinical measurements within normal limits will improve Outcome: Progressing Goal: Will remain free from infection Outcome: Progressing Goal: Respiratory complications will improve Outcome: Progressing   Problem: Activity: Goal: Risk for activity intolerance will decrease Outcome: Progressing   Problem: Nutrition: Goal: Adequate nutrition will be maintained Outcome: Progressing   Problem: Coping: Goal: Level of anxiety will decrease Outcome: Progressing   Problem: Elimination: Goal: Will not experience complications related to bowel motility Outcome: Progressing   Problem: Pain Managment: Goal: General experience of comfort will improve Outcome: Progressing

## 2018-11-18 NOTE — Progress Notes (Signed)
Advanced care plan.  Purpose of the Encounter: CODE STATUS  Parties in Attendance: Patient and family  Patient's Decision Capacity: Good  Subjective/Patient's story: Presented to emergency room for shortness of breath and wheezing   Objective/Medical story Patient has acute COPD exacerbation Needs IV steroids and nebulization treatments aggressively   Goals of care determination:  Advance care directives, goals of care and treatment plan discussed Patient wants everything done for now which includes CPR, ventilator if the need arises   CODE STATUS: Full code   Time spent discussing advanced care planning: 16 minutes

## 2018-11-19 DIAGNOSIS — J440 Chronic obstructive pulmonary disease with acute lower respiratory infection: Secondary | ICD-10-CM | POA: Diagnosis not present

## 2018-11-19 LAB — HIV ANTIBODY (ROUTINE TESTING W REFLEX): HIV Screen 4th Generation wRfx: NONREACTIVE

## 2018-11-19 LAB — GLUCOSE, CAPILLARY: Glucose-Capillary: 124 mg/dL — ABNORMAL HIGH (ref 70–99)

## 2018-11-19 MED ORDER — POTASSIUM CHLORIDE ER 10 MEQ PO TBCR: 10.0000 meq | EXTENDED_RELEASE_TABLET | Freq: Every day | ORAL | 0 refills | Status: DC

## 2018-11-19 MED ORDER — PREDNISONE 10 MG PO TABS: 10.0000 mg | ORAL_TABLET | Freq: Every day | ORAL | 0 refills | Status: DC

## 2018-11-19 MED ORDER — PHENOL 1.4 % MT LIQD
2.0000 | OROMUCOSAL | Status: DC | PRN
  Filled 2018-11-19: qty 177

## 2018-11-19 MED ORDER — ETODOLAC 400 MG PO TABS: 400.0000 mg | ORAL_TABLET | Freq: Every day | ORAL | 0 refills | Status: AC

## 2018-11-19 MED ORDER — FOLIC ACID 1 MG PO TABS: 1.0000 mg | ORAL_TABLET | Freq: Every day | ORAL | 0 refills | Status: AC

## 2018-11-19 MED ORDER — MAGNESIUM 200 MG PO TABS: 200.0000 mg | ORAL_TABLET | Freq: Every day | ORAL | 0 refills | Status: AC

## 2018-11-19 MED ORDER — MAGIC MOUTHWASH
5.0000 mL | Freq: Three times a day (TID) | ORAL | Status: DC | PRN
  Administered 2018-11-19: 5 mL via ORAL
  Filled 2018-11-19 (×2): qty 10

## 2018-11-19 MED ORDER — FERROUS SULFATE 325 (65 FE) MG PO TABS: 325.0000 mg | ORAL_TABLET | Freq: Two times a day (BID) | ORAL | 0 refills | Status: AC

## 2018-11-19 MED ORDER — METFORMIN HCL 1000 MG PO TABS: 1000.0000 mg | ORAL_TABLET | Freq: Two times a day (BID) | ORAL | 0 refills | Status: AC

## 2018-11-19 MED ORDER — MAGIC MOUTHWASH W/LIDOCAINE: 10.0000 mL | Freq: Three times a day (TID) | ORAL | Status: DC | PRN

## 2018-11-19 MED ORDER — MAGIC MOUTHWASH: 5.0000 mL | Freq: Three times a day (TID) | ORAL | 0 refills | Status: DC | PRN

## 2018-11-19 MED ORDER — AZITHROMYCIN 250 MG PO TABS: ORAL_TABLET | ORAL | 0 refills | Status: AC

## 2018-11-19 MED ORDER — LIDOCAINE VISCOUS HCL 2 % MT SOLN
5.0000 mL | Freq: Three times a day (TID) | OROMUCOSAL | Status: DC | PRN
  Filled 2018-11-19: qty 15

## 2018-11-19 MED ORDER — FLUTICASONE-SALMETEROL 250-50 MCG/DOSE IN AEPB: 1.0000 | INHALATION_SPRAY | Freq: Two times a day (BID) | RESPIRATORY_TRACT | 0 refills | Status: DC

## 2018-11-19 MED ORDER — METHOTREXATE 2.5 MG PO TABS: 2.5000 mg | ORAL_TABLET | ORAL | 0 refills | Status: DC

## 2018-11-19 MED ORDER — LIDOCAINE VISCOUS HCL 2 % MT SOLN: 5.0000 mL | Freq: Three times a day (TID) | OROMUCOSAL | 0 refills | Status: DC | PRN

## 2018-11-19 MED ORDER — ASPIRIN EC 81 MG PO TBEC: 81.0000 mg | DELAYED_RELEASE_TABLET | Freq: Every day | ORAL | 0 refills | Status: AC

## 2018-11-19 NOTE — Discharge Summary (Signed)
[facility] - [place] [facility]   PATIENT NAME: Misty Briggs    MR#:  [id number]  DATE OF BIRTH:  04-13-1951  DATE OF ADMISSION:  11/17/2018 ADMITTING PHYSICIAN: Katha Hamming, MD  DATE OF DISCHARGE: 11/19/2018  PRIMARY CARE PHYSICIAN: Jerl Mina, MD   ADMISSION DIAGNOSIS:  COPD exacerbation (HCC) [J44.1] Acute on chronic bronchitis GERD Hyperlipidemia Rheumatoid arthritis DISCHARGE DIAGNOSIS:  Active Problems:   COPD exacerbation (HCC) Acute on chronic bronchitis GERD Hyperlipidemia Rheumatoid arthritis  SECONDARY DIAGNOSIS:   Past Medical History:  Diagnosis Date  . Arthritis   . Asthma   . COPD (chronic obstructive pulmonary disease) (HCC)    Dr. Meredeth Ide, [facility] pulmonologist  . Diabetes mellitus   . GERD (gastroesophageal reflux disease)   . Heart murmur   . HLD (hyperlipidemia)   . Hypertension   . Myocardial infarction (HCC)    mild  . PONV (postoperative nausea and vomiting)    itching sometimes  . Rheumatoid arthritis(714.0)   . Shortness of breath      ADMITTING HISTORY Otillie Briggs  is a 67 y.o. female with a known history of COPD, rheumatoid arthritis, diabetes mellitus type 2 comes in because of worsening shortness of breath despite prolonged course of steroids for 10 days as an outpatient, also on doxycycline 100 mg p.o. twice daily for 1 week, today comes in because of worsening shortness of breath, cough.  Received series of nebs, steroids.  Ask you to admit the patient observation status because of COPD exacerbation, failed outpatient therapy.  Patient is not on oxygen, labs are largely within normal range.  Chest x-ray negative for pneumonia.  Patient not in distress at this time and able to talk full sentences.  HOSPITAL COURSE:  Patient was admitted to medical floor.  She was aggressively treated with nebulization treatments and IV Solu-Medrol.  She finished her course of doxycycline antibiotic.   Chest x-ray did not reveal any pneumonia.  Patient has acute on chronic bronchitis.  Patient shortness of breath improved.  He was weaned off oxygen.  Wheezing also improved.  She will be discharged home on albuterol inhaler and Advair Diskus inhaler along with tapering dose of steroids.  CONSULTS OBTAINED:   None DRUG ALLERGIES:   Allergies  Allergen Reactions  . Penicillins Hives, Palpitations and Rash  . Levofloxacin Other (See Comments)    Joint pain/muscle pain  . Sulfa Antibiotics Rash    DISCHARGE MEDICATIONS:   Allergies as of 11/19/2018      Reactions   Penicillins Hives, Palpitations, Rash   Levofloxacin Other (See Comments)   Joint pain/muscle pain   Sulfa Antibiotics Rash      Medication List    STOP taking these medications   EPINEPHrine 0.3 mg/0.3 mL Devi Commonly known as:  EPI-PEN   furosemide 20 MG tablet Commonly known as:  LASIX     TAKE these medications   albuterol 108 (90 Base) MCG/ACT inhaler Commonly known as:  PROVENTIL HFA;VENTOLIN HFA Inhale 2 puffs into the lungs every 6 (six) hours as needed for wheezing or shortness of breath.   aspirin EC 81 MG tablet Take 1 tablet (81 mg total) by mouth daily. What changed:    how much to take  when to take this   azithromycin 250 MG tablet Commonly known as:  ZITHROMAX Z-PAK Take 2 tablets (500 mg) on  Day 1,  followed by 1 tablet (250 mg) once daily on Days 2 through 5.   etodolac  400 MG tablet Commonly known as:  LODINE Take 1 tablet (400 mg total) by mouth daily. What changed:    how much to take  how to take this  when to take this   ferrous sulfate 325 (65 FE) MG tablet Take 1 tablet (325 mg total) by mouth 2 (two) times daily with a meal. What changed:    how much to take  when to take this   fluticasone 50 MCG/ACT nasal spray Commonly known as:  FLONASE Place into the nose.   Fluticasone-Salmeterol 250-50 MCG/DOSE Aepb Commonly known as:  ADVAIR DISKUS Inhale 1 puff  into the lungs 2 (two) times daily.   folic acid 1 MG tablet Commonly known as:  FOLVITE Take 1 tablet (1 mg total) by mouth daily. What changed:    how much to take  how to take this  when to take this   hydrochlorothiazide 25 MG tablet Commonly known as:  HYDRODIURIL Take 25 mg by mouth daily.   levocetirizine 5 MG tablet Commonly known as:  XYZAL Take 5 mg by mouth every evening.   lidocaine 2 % solution Commonly known as:  XYLOCAINE Use as directed 5 mLs in the mouth or throat 3 (three) times daily as needed for mouth pain.   lovastatin 20 MG tablet Commonly known as:  MEVACOR Take 20 mg by mouth at bedtime.   magic mouthwash Soln Take 5 mLs by mouth 3 (three) times daily as needed for mouth pain.   Magnesium 200 MG Tabs Take 1 tablet (200 mg total) by mouth daily. What changed:    how much to take  when to take this   metFORMIN 1000 MG tablet Commonly known as:  GLUCOPHAGE Take 1 tablet (1,000 mg total) by mouth 2 (two) times daily with a meal. What changed:    how much to take  how to take this  when to take this   methotrexate 2.5 MG tablet Commonly known as:  RHEUMATREX Take 1 tablet (2.5 mg total) by mouth once a week. What changed:    how much to take  how to take this  when to take this   montelukast 10 MG tablet Commonly known as:  SINGULAIR Take 10 mg by mouth at bedtime.   omeprazole 20 MG capsule Commonly known as:  PRILOSEC Take 20 mg by mouth daily.   potassium chloride 10 MEQ tablet Commonly known as:  K-DUR Take 1 tablet (10 mEq total) by mouth daily. What changed:    how much to take  when to take this   predniSONE 10 MG tablet Commonly known as:  DELTASONE Take 1 tablet (10 mg total) by mouth daily. Label  & dispense according to the schedule below.  6 tablets day one, then 5 table day 2, then 4 tablets day 3, then 3 tablets day 4, 2 tablets day 5, then 1 tablet day 6, then stop   telmisartan 20 MG  tablet Commonly known as:  MICARDIS Take 20 mg by mouth daily.       Today  Patient seen today No shortness of breath Decreased wheezing Comfortable on room air Tolerated diet well Hemodynamically stable Will be discharged home  VITAL SIGNS:  Blood pressure 130/61, pulse 81, temperature 98.5 F (36.9 C), temperature source Oral, resp. rate 18, height 5\' 6"  (1.676 m), weight 74.3 kg, SpO2 100 %.  I/O:    Intake/Output Summary (Last 24 hours) at 11/19/2018 1206 Last data filed at 11/19/2018 0723 Gross per  24 hour  Intake 2104.36 ml  Output 0 ml  Net 2104.36 ml    PHYSICAL EXAMINATION:  Physical Exam  GENERAL:  67 y.o.-year-old patient lying in the bed with no acute distress.  LUNGS: Normal breath sounds bilaterally, no wheezing, rales,rhonchi or crepitation. No use of accessory muscles of respiration.  CARDIOVASCULAR: S1, S2 normal. No murmurs, rubs, or gallops.  ABDOMEN: Soft, non-tender, non-distended. Bowel sounds present. No organomegaly or mass.  NEUROLOGIC: Moves all 4 extremities. PSYCHIATRIC: The patient is alert and oriented x 3.  SKIN: No obvious rash, lesion, or ulcer.   DATA REVIEW:   CBC Recent Labs  Lab 11/18/18 0455  WBC 6.3  HGB 11.8*  HCT 37.0  PLT 385    Chemistries  Recent Labs  Lab 11/17/18 1551 11/18/18 0455  NA 139 140  K 4.0 3.9  CL 97* 104  CO2 30 24  GLUCOSE 114* 252*  BUN 17 25*  CREATININE 1.15* 1.46*  CALCIUM 9.0 9.4  AST 22  --   ALT 23  --   ALKPHOS 129*  --   BILITOT 1.4*  --     Cardiac Enzymes Recent Labs  Lab 11/17/18 1855  TROPONINI <0.03    Microbiology Results  Results for orders placed or performed during the hospital encounter of 05/02/12  Surgical pcr screen     Status: None   Collection Time: 05/02/12  9:41 AM  Result Value Ref Range Status   MRSA, PCR NEGATIVE NEGATIVE Final   Staphylococcus aureus NEGATIVE NEGATIVE Final    Comment:        The Xpert SA Assay (FDA approved for NASAL  specimens only), is one component of a comprehensive surveillance program.  It is not intended to diagnose infection nor to guide or monitor treatment.    RADIOLOGY:  Dg Chest 2 View  Result Date: 11/17/2018 CLINICAL DATA:  Cough EXAM: CHEST - 2 VIEW COMPARISON:  May 02, 2012 FINDINGS: No edema or consolidation. The heart size and pulmonary vascularity are normal. No adenopathy. There is postoperative change in the lower cervical spine. There is degenerative change in the lower thoracic spine. IMPRESSION: No edema or consolidation. Electronically Signed   By: Bretta Bang III M.D.   On: 11/17/2018 15:16    Follow up with PCP in 1 week.  Management plans discussed with the patient, family and they are in agreement.  CODE STATUS: Full code    Code Status Orders  (From admission, onward)         Start     Ordered   11/17/18 1739  Full code  Continuous     11/17/18 1739        Code Status History    This patient has a current code status but no historical code status.    Advance Directive Documentation     Most Recent Value  Type of Advance Directive  Healthcare Power of Attorney, Living will  Pre-existing out of facility DNR order (yellow form or pink MOST form)  -  "MOST" Form in Place?  -      TOTAL TIME TAKING CARE OF THIS PATIENT ON DAY OF DISCHARGE: more than 35 minutes.   Ihor Austin M.D on 11/19/2018 at 12:06 PM  Between 7am to 6pm - Pager - [phone]  After 6pm go to [email or web address] - password [email or web address]  [facility]  Office  [phone]  CC: Primary care physician; Jerl Mina, MD  Note: This dictation was prepared with Dragon dictation  along with smaller phrase technology. Any transcriptional errors that result from this process are unintentional.

## 2018-11-19 NOTE — Progress Notes (Signed)
Patient discharging home. Instructions and prescriptions given to patient. Verbalized understanding. Family at bedside to transport patient home. IV removed.

## 2018-11-19 NOTE — Plan of Care (Signed)
  Problem: Clinical Measurements: Goal: Ability to maintain clinical measurements within normal limits will improve Outcome: Progressing Goal: Will remain free from infection Outcome: Progressing Goal: Respiratory complications will improve Outcome: Progressing   Problem: Activity: Goal: Risk for activity intolerance will decrease Outcome: Progressing   Problem: Nutrition: Goal: Adequate nutrition will be maintained Outcome: Progressing   Problem: Coping: Goal: Level of anxiety will decrease Outcome: Progressing   Problem: Elimination: Goal: Will not experience complications related to bowel motility Outcome: Progressing   Problem: Pain Managment: Goal: General experience of comfort will improve Outcome: Progressing   Problem: Safety: Goal: Ability to remain free from injury will improve Outcome: Progressing

## 2019-09-25 ENCOUNTER — Inpatient Hospital Stay
Admission: EM | Admit: 2019-09-25 | Discharge: 2019-09-26 | DRG: 871 | Disposition: A | Discharge status: Other Acute Inpatient Hospital | Payer: Medicare Other | Attending: Family Medicine | Admitting: Family Medicine

## 2019-09-25 ENCOUNTER — Other Ambulatory Visit: Payer: Self-pay

## 2019-09-25 ENCOUNTER — Emergency Department: Payer: Medicare Other

## 2019-09-25 DIAGNOSIS — Z882 Allergy status to sulfonamides status: Secondary | ICD-10-CM

## 2019-09-25 DIAGNOSIS — A4189 Other specified sepsis: Secondary | ICD-10-CM | POA: Diagnosis present

## 2019-09-25 DIAGNOSIS — Z79899 Other long term (current) drug therapy: Secondary | ICD-10-CM

## 2019-09-25 DIAGNOSIS — J1289 Other viral pneumonia: Secondary | ICD-10-CM | POA: Diagnosis present

## 2019-09-25 DIAGNOSIS — Z803 Family history of malignant neoplasm of breast: Secondary | ICD-10-CM

## 2019-09-25 DIAGNOSIS — Z881 Allergy status to other antibiotic agents status: Secondary | ICD-10-CM | POA: Diagnosis not present

## 2019-09-25 DIAGNOSIS — M1990 Unspecified osteoarthritis, unspecified site: Secondary | ICD-10-CM | POA: Diagnosis present

## 2019-09-25 DIAGNOSIS — Z880 Allergy status to penicillin: Secondary | ICD-10-CM | POA: Diagnosis not present

## 2019-09-25 DIAGNOSIS — I252 Old myocardial infarction: Secondary | ICD-10-CM | POA: Diagnosis not present

## 2019-09-25 DIAGNOSIS — I2510 Atherosclerotic heart disease of native coronary artery without angina pectoris: Secondary | ICD-10-CM | POA: Diagnosis present

## 2019-09-25 DIAGNOSIS — Z7982 Long term (current) use of aspirin: Secondary | ICD-10-CM

## 2019-09-25 DIAGNOSIS — A419 Sepsis, unspecified organism: Secondary | ICD-10-CM | POA: Diagnosis present

## 2019-09-25 DIAGNOSIS — E1122 Type 2 diabetes mellitus with diabetic chronic kidney disease: Secondary | ICD-10-CM | POA: Diagnosis present

## 2019-09-25 DIAGNOSIS — J189 Pneumonia, unspecified organism: Secondary | ICD-10-CM | POA: Diagnosis not present

## 2019-09-25 DIAGNOSIS — J9601 Acute respiratory failure with hypoxia: Secondary | ICD-10-CM | POA: Diagnosis present

## 2019-09-25 DIAGNOSIS — J440 Chronic obstructive pulmonary disease with acute lower respiratory infection: Secondary | ICD-10-CM | POA: Diagnosis present

## 2019-09-25 DIAGNOSIS — Z7984 Long term (current) use of oral hypoglycemic drugs: Secondary | ICD-10-CM

## 2019-09-25 DIAGNOSIS — I129 Hypertensive chronic kidney disease with stage 1 through stage 4 chronic kidney disease, or unspecified chronic kidney disease: Secondary | ICD-10-CM | POA: Diagnosis present

## 2019-09-25 DIAGNOSIS — U071 COVID-19: Secondary | ICD-10-CM | POA: Diagnosis present

## 2019-09-25 DIAGNOSIS — M069 Rheumatoid arthritis, unspecified: Secondary | ICD-10-CM | POA: Diagnosis present

## 2019-09-25 DIAGNOSIS — D631 Anemia in chronic kidney disease: Secondary | ICD-10-CM | POA: Diagnosis present

## 2019-09-25 DIAGNOSIS — K219 Gastro-esophageal reflux disease without esophagitis: Secondary | ICD-10-CM | POA: Diagnosis present

## 2019-09-25 DIAGNOSIS — E785 Hyperlipidemia, unspecified: Secondary | ICD-10-CM | POA: Diagnosis present

## 2019-09-25 DIAGNOSIS — Z981 Arthrodesis status: Secondary | ICD-10-CM | POA: Diagnosis not present

## 2019-09-25 DIAGNOSIS — N183 Chronic kidney disease, stage 3 unspecified: Secondary | ICD-10-CM | POA: Diagnosis present

## 2019-09-25 LAB — URINALYSIS, COMPLETE (UACMP) WITH MICROSCOPIC
Bacteria, UA: NONE SEEN
Bilirubin Urine: NEGATIVE
Glucose, UA: NEGATIVE mg/dL
Hgb urine dipstick: NEGATIVE
Ketones, ur: NEGATIVE mg/dL
Leukocytes,Ua: NEGATIVE
Nitrite: NEGATIVE
Protein, ur: 30 mg/dL — AB
Specific Gravity, Urine: 1.017 (ref 1.005–1.030)
pH: 5 (ref 5.0–8.0)

## 2019-09-25 LAB — LACTIC ACID, PLASMA: Lactic Acid, Venous: 0.9 mmol/L (ref 0.5–1.9)

## 2019-09-25 LAB — COMPREHENSIVE METABOLIC PANEL
ALT: 36 U/L (ref 0–44)
AST: 34 U/L (ref 15–41)
Albumin: 3.6 g/dL (ref 3.5–5.0)
Alkaline Phosphatase: 103 U/L (ref 38–126)
Anion gap: 15 (ref 5–15)
BUN: 22 mg/dL (ref 8–23)
CO2: 28 mmol/L (ref 22–32)
Calcium: 8.7 mg/dL — ABNORMAL LOW (ref 8.9–10.3)
Chloride: 93 mmol/L — ABNORMAL LOW (ref 98–111)
Creatinine, Ser: 1.45 mg/dL — ABNORMAL HIGH (ref 0.44–1.00)
GFR calc Af Amer: 43 mL/min — ABNORMAL LOW (ref >60)
GFR calc non Af Amer: 37 mL/min — ABNORMAL LOW (ref >60)
Glucose, Bld: 137 mg/dL — ABNORMAL HIGH (ref 70–99)
Potassium: 4.1 mmol/L (ref 3.5–5.1)
Sodium: 136 mmol/L (ref 135–145)
Total Bilirubin: 0.6 mg/dL (ref 0.3–1.2)
Total Protein: 7 g/dL (ref 6.5–8.1)

## 2019-09-25 LAB — CBC WITH DIFFERENTIAL/PLATELET
Abs Immature Granulocytes: 0.05 10*3/uL (ref 0.00–0.07)
Basophils Absolute: 0 10*3/uL (ref 0.0–0.1)
Basophils Relative: 0 %
Eosinophils Absolute: 0 10*3/uL (ref 0.0–0.5)
Eosinophils Relative: 0 %
HCT: 36.3 % (ref 36.0–46.0)
Hemoglobin: 11.8 g/dL — ABNORMAL LOW (ref 12.0–15.0)
Immature Granulocytes: 1 %
Lymphocytes Relative: 6 %
Lymphs Abs: 0.5 10*3/uL — ABNORMAL LOW (ref 0.7–4.0)
MCH: 30.5 pg (ref 26.0–34.0)
MCHC: 32.5 g/dL (ref 30.0–36.0)
MCV: 93.8 fL (ref 80.0–100.0)
Monocytes Absolute: 0.4 10*3/uL (ref 0.1–1.0)
Monocytes Relative: 5 %
Neutro Abs: 6.9 10*3/uL (ref 1.7–7.7)
Neutrophils Relative %: 88 %
Platelets: 212 10*3/uL (ref 150–400)
RBC: 3.87 MIL/uL (ref 3.87–5.11)
RDW: 14 % (ref 11.5–15.5)
WBC: 7.7 10*3/uL (ref 4.0–10.5)
nRBC: 0 % (ref 0.0–0.2)

## 2019-09-25 MED ORDER — SODIUM CHLORIDE 0.9 % IV SOLN
500.0000 mg | INTRAVENOUS | Status: DC
  Administered 2019-09-25: 500 mg via INTRAVENOUS
  Filled 2019-09-25: qty 500

## 2019-09-25 MED ORDER — SODIUM CHLORIDE 0.9 % IV SOLN
2.0000 g | INTRAVENOUS | Status: DC
  Administered 2019-09-25: 20:00:00 2 g via INTRAVENOUS
  Filled 2019-09-25: qty 20

## 2019-09-25 MED ORDER — ACETAMINOPHEN 325 MG PO TABS
650.0000 mg | ORAL_TABLET | Freq: Once | ORAL | Status: AC | PRN
  Administered 2019-09-25: 15:00:00 650 mg via ORAL
  Filled 2019-09-25: qty 2

## 2019-09-25 NOTE — ED Notes (Signed)
ED Provider at bedside. 

## 2019-09-25 NOTE — H&P (Signed)
History and Physical   [facility] [place] [facility] Admission History and Physical [employer] Christeena Krogh, D.O.    Patient Name: Misty Briggs MR#: [id number] Date of Birth: 17-Aug-1951 Date of Admission: 09/25/2019  Referring MD/NP/PA: Dr. Marisa SeverinSiadecki Primary Care Physician: Jerl MinaHedrick, James, MD  Chief Complaint:  Chief Complaint  Patient presents with  . Pneumonia  . Fever  . Shortness of Breath    HPI: Misty GermanDoris Matherly is a 68 y.o. female with a known history of asthma, COPD, DM, GERD, HTN, HLD, CAD s/p MI presents to the emergency department for evaluation of cough, SOB, fever for three days. She was referred to the ED by per pulmonologist.     Patient denies fevers/chills, weakness, dizziness, chest pain, N/V/C/D, abdominal pain, dysuria/frequency, changes in mental status.    Otherwise there has been no change in status. Patient has been taking medication as prescribed and there has been no recent change in medication or diet.  No recent antibiotics.  There has been no recent illness, hospitalizations, travel or sick contacts.    EMS/ED Course: Patient received rocephin/Azithro. Medical admission has been requested for further management of CAP.  Review of Systems:  CONSTITUTIONAL: Positive fever/chills. Negative fatigue, weakness, weight gain/loss, headache. EYES: No blurry or double vision. ENT: No tinnitus, postnasal drip, redness or soreness of the oropharynx. RESPIRATORY: Positive cough, dyspnea. Negative wheeze.  No hemoptysis.  CARDIOVASCULAR: No chest pain, palpitations, syncope, orthopnea. No lower extremity edema.  GASTROINTESTINAL: No nausea, vomiting, abdominal pain, diarrhea, constipation.  No hematemesis, melena or hematochezia. GENITOURINARY: No dysuria, frequency, hematuria. ENDOCRINE: No polyuria or nocturia. No heat or cold intolerance. HEMATOLOGY: No anemia, bruising, bleeding. INTEGUMENTARY: No rashes, ulcers, lesions. MUSCULOSKELETAL: No arthritis,  gout, dyspnea. NEUROLOGIC: No numbness, tingling, ataxia, seizure-type activity, weakness. PSYCHIATRIC: No anxiety, depression, insomnia.   Past Medical History:  Diagnosis Date  . Arthritis   . Asthma   . COPD (chronic obstructive pulmonary disease) (HCC)    Dr. Meredeth IdeFleming, [facility] pulmonologist  . Diabetes mellitus   . GERD (gastroesophageal reflux disease)   . Heart murmur   . HLD (hyperlipidemia)   . Hypertension   . Myocardial infarction (HCC)    mild  . PONV (postoperative nausea and vomiting)    itching sometimes  . Rheumatoid arthritis(714.0)   . Shortness of breath     Past Surgical History:  Procedure Laterality Date  . ANTERIOR CERVICAL DECOMP/DISCECTOMY FUSION  05/05/2012   Procedure: ANTERIOR CERVICAL DECOMPRESSION/DISCECTOMY FUSION 1 LEVEL;  Surgeon: Karn CassisErnesto M Botero, MD;  Location: [facility]URO [facility]S;  Service: Neurosurgery;  Laterality: N/A;  Cervical six seven Anterior cervical decompression/diskectomy, Fusion, Plate  . COLONOSCOPY WITH PROPOFOL N/A 09/08/2017   Procedure: COLONOSCOPY WITH PROPOFOL;  Surgeon: Christena DeemSkulskie, Martin U, MD;  [facility];  Service: Endoscopy;  Laterality: N/A;  . ENDOMETRIAL ABLATION     novasure  . FOOT SURGERY  2015  . ROTATOR CUFF REPAIR     bilat  . TRANSTHORACIC ECHOCARDIOGRAM  2010   [facility]  . TUBAL LIGATION       reports that she has never smoked. She has never used smokeless tobacco. She reports that she does not drink alcohol or use drugs.  Allergies  Allergen Reactions  . Penicillins Hives, Palpitations and Rash  . Levofloxacin Other (See Comments)    Joint pain/muscle pain  . Sulfa Antibiotics Rash    Family History  Problem Relation Age of Onset  . Breast cancer Paternal Aunt  Prior to Admission medications   Medication Sig Start Date End Date Taking? Authorizing Provider  albuterol (PROVENTIL HFA;VENTOLIN HFA) 108 (90 Base) MCG/ACT inhaler Inhale 2 puffs into the  lungs every 6 (six) hours as needed for wheezing or shortness of breath.    [provider]  ferrous sulfate 325 (65 FE) MG tablet Take 1 tablet (325 mg total) by mouth 2 (two) times daily with a meal. 11/19/18 12/19/18  Pyreddy, Vivien Rota, MD  fluticasone (FLONASE) 50 MCG/ACT nasal spray Place into the nose.    [provider]  Fluticasone-Salmeterol (ADVAIR DISKUS) 250-50 MCG/DOSE AEPB Inhale 1 puff into the lungs 2 (two) times daily. 11/19/18 12/19/18  Ihor Austin, MD  hydrochlorothiazide (HYDRODIURIL) 25 MG tablet Take 25 mg by mouth daily.    [provider]  levocetirizine (XYZAL) 5 MG tablet Take 5 mg by mouth every evening.    [provider]  lidocaine (XYLOCAINE) 2 % solution Use as directed 5 mLs in the mouth or throat 3 (three) times daily as needed for mouth pain. 11/19/18   Ihor Austin, MD  lovastatin (MEVACOR) 20 MG tablet Take 20 mg by mouth at bedtime.    [provider]  magic mouthwash SOLN Take 5 mLs by mouth 3 (three) times daily as needed for mouth pain. 11/19/18   Ihor Austin, MD  metFORMIN (GLUCOPHAGE) 1000 MG tablet Take 1 tablet (1,000 mg total) by mouth 2 (two) times daily with a meal. 11/19/18 12/19/18  Pyreddy, Vivien Rota, MD  methotrexate (RHEUMATREX) 2.5 MG tablet Take 1 tablet (2.5 mg total) by mouth once a week. 11/19/18   Ihor Austin, MD  montelukast (SINGULAIR) 10 MG tablet Take 10 mg by mouth at bedtime.    [provider]  omeprazole (PRILOSEC) 20 MG capsule Take 20 mg by mouth daily.    [provider]  potassium chloride (K-DUR) 10 MEQ tablet Take 1 tablet (10 mEq total) by mouth daily. 11/19/18 12/19/18  Ihor Austin, MD  predniSONE (DELTASONE) 10 MG tablet Take 1 tablet (10 mg total) by mouth daily. Label  & dispense according to the schedule below.  6 tablets day one, then 5 table day 2, then 4 tablets day 3, then 3 tablets day 4, 2 tablets day 5, then 1 tablet day 6, then stop 11/19/18   Ihor Austin, MD  telmisartan (MICARDIS) 20 MG tablet Take 20 mg by mouth daily.    [provider]    Physical Exam: Vitals:   09/25/19 1516 09/25/19 1517 09/25/19 1820 09/25/19 1852  BP: 122/63  (!) 111/47 (!) 117/48  Pulse: (!) 107  91 84  Resp: 16  (!) 22 20  Temp: (!) 101.7 F (38.7 C)  98.8 F (37.1 C)   TempSrc: Oral  Oral   SpO2: 97%  93% 93%  Weight:  74.4 kg    Height:  5\' 6"  (1.676 m)      GENERAL: 68 y.o.-year-old female patient, well-developed, well-nourished lying in the bed in no acute distress.  Pleasant and cooperative.   HEENT: Head atraumatic, normocephalic. Pupils equal. Mucus membranes moist. NECK: Supple. No JVD. CHEST: Normal breath sounds bilaterally. No wheezing, rales, rhonchi or crackles. No use of accessory muscles of respiration.  No reproducible chest wall tenderness.  CARDIOVASCULAR: S1, S2 normal. No murmurs, rubs, or gallops. Cap refill <2 seconds. Pulses intact distally.  ABDOMEN: Soft, nondistended, nontender. No rebound, guarding, rigidity. Normoactive bowel sounds present in all four quadrants.  EXTREMITIES: No pedal edema, cyanosis, or  clubbing. No calf tenderness or Homan's sign.  NEUROLOGIC: The patient is alert and oriented x 3. Cranial nerves II through XII are grossly intact with no focal sensorimotor deficit. PSYCHIATRIC:  Normal affect, mood, thought content. SKIN: Warm, dry, and intact without obvious rash, lesion, or ulcer.    Labs on Admission:  CBC: Recent Labs  Lab 09/25/19 1520  WBC 7.7  NEUTROABS 6.9  HGB 11.8*  HCT 36.3  MCV 93.8  PLT 212   Basic Metabolic Panel: Recent Labs  Lab 09/25/19 1520  NA 136  K 4.1  CL 93*  CO2 28  GLUCOSE 137*  BUN 22  CREATININE 1.45*  CALCIUM 8.7*   GFR: Estimated Creatinine Clearance: 38.8 mL/min (A) (by C-G formula based on SCr of 1.45 mg/dL (H)). Liver Function Tests: Recent Labs  Lab 09/25/19 1520  AST 34  ALT 36  ALKPHOS 103  BILITOT 0.6  PROT 7.0  ALBUMIN 3.6    No results for input(s): LIPASE, AMYLASE in the last 168 hours. No results for input(s): AMMONIA in the last 168 hours. Coagulation Profile: No results for input(s): INR, PROTIME in the last 168 hours. Cardiac Enzymes: No results for input(s): CKTOTAL, CKMB, CKMBINDEX, TROPONINI in the last 168 hours. BNP (last 3 results) No results for input(s): PROBNP in the last 8760 hours. HbA1C: No results for input(s): HGBA1C in the last 72 hours. CBG: No results for input(s): GLUCAP in the last 168 hours. Lipid Profile: No results for input(s): CHOL, HDL, LDLCALC, TRIG, CHOLHDL, LDLDIRECT in the last 72 hours. Thyroid Function Tests: No results for input(s): TSH, T4TOTAL, FREET4, T3FREE, THYROIDAB in the last 72 hours. Anemia Panel: No results for input(s): VITAMINB12, FOLATE, FERRITIN, TIBC, IRON, RETICCTPCT in the last 72 hours. Urine analysis:    Component Value Date/Time   COLORURINE YELLOW (A) 09/25/2019 1826   APPEARANCEUR HAZY (A) 09/25/2019 1826   LABSPEC 1.017 09/25/2019 1826   PHURINE 5.0 09/25/2019 1826   GLUCOSEU NEGATIVE 09/25/2019 1826   HGBUR NEGATIVE 09/25/2019 1826   BILIRUBINUR NEGATIVE 09/25/2019 1826   KETONESUR NEGATIVE 09/25/2019 1826   PROTEINUR 30 (A) 09/25/2019 1826   NITRITE NEGATIVE 09/25/2019 1826   LEUKOCYTESUR NEGATIVE 09/25/2019 1826   Sepsis Labs: @LABRCNTIP (procalcitonin:4,lacticidven:4) )No results found for this or any previous visit (from the past 240 hour(s)).   Radiological Exams on Admission: Dg Chest 2 View  Result Date: 09/25/2019 CLINICAL DATA:  Shortness of breath, nonproductive cough, recently treated with antibiotics for pneumonia, onset of fever today; history asthma, COPD, diabetes mellitus, hypertension EXAM: CHEST - 2 VIEW COMPARISON:  11/17/2018 FINDINGS: Normal heart size, mediastinal contours, and pulmonary vascularity. Interstitial infiltrate at the mid to lower LEFT lung, minimally on RIGHT. Remaining lungs clear. No pleural  effusion or pneumothorax. 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 IMPRESSION: Interstitial infiltrates at the mid to lower LEFT lung, minimally on RIGHT. Electronically Signed   By: 11/19/2018 M.D.   On: 09/25/2019 16:04    EKG: Sinus tach at 109 bpm with normal axis and nonspecific ST-T wave changes.   Assessment/Plan  This is a 68 y.o. female with a history of asthma, COPD, DM, GERD, HTN, HLD, CAD s/p MI now being admitted with:  #. #. Sepsis secondary to CAP - Admit to inpatient with telemetry monitoring - IV antibiotics: Rocephin, Azithro - IV fluid hydration, O2, mednebs and expectorants as needed.  - Follow up blood, sputum cultures. Urine legionella and strep Ag.  - Repeat CBC in am.    #.  History of CAD - Continue aspirin  #. H/o Diabetes - Accuchecks achs with RISS coverage - Heart healthy, carb controlled diet - Hold metformin  #. History of HTN - Continue Lasix, HCTZ, Avapro  #. History of HLD - Continue pravastatin  #. History of COPD - Continue prednisone, nebs, Xyzal, Singulari, Trelegy  #. History of GERD - Continue Protonix  Admission status: Inpatient IV Fluids: NS Diet/Nutrition: Heart healthy, carb controlled Consults called: None  DVT Px: Lovenox, SCDs and early ambulation. Code Status: Full Code  Disposition Plan: To home in 1-2 days  All the records are reviewed and case discussed with ED provider. Management plans discussed with the patient and/or family who express understanding and agree with plan of care.  Jatavis Malek D.O. on 09/25/2019 at 8:16 PM CC: Primary care physician; [place] Pink, MD   09/25/2019, 8:16 PM

## 2019-09-25 NOTE — ED Provider Notes (Signed)
[facility] Emergency Department Provider Note ____________________________________________   First MD Initiated Contact with Patient 09/25/19 1833     (approximate)  I have reviewed the triage vital signs and the nursing notes.   HISTORY  Chief Complaint Pneumonia, Fever, and Shortness of Breath    HPI Misty Briggs is a 68 y.o. female with PMH as noted below including COPD who presents for evaluation for likely pneumonia.  The patient reports nonproductive cough for the last several days associated with shortness of breath, gradual onset, and today also associated with fever.  The patient went to her pulmonologist and was diagnosed with pneumonia and sent to the hospital.  She denies chest pain.  She has had no vomiting or diarrhea.  The patient reports that she is on a low-dose of steroid for her COPD but not currently on antibiotics.  Past Medical History:  Diagnosis Date   Arthritis    Asthma    COPD (chronic obstructive pulmonary disease) (HCC)    Dr. Meredeth IdeFleming, [facility] pulmonologist   Diabetes mellitus    GERD (gastroesophageal reflux disease)    Heart murmur    HLD (hyperlipidemia)    Hypertension    Myocardial infarction (HCC)    mild   PONV (postoperative nausea and vomiting)    itching sometimes   Rheumatoid arthritis(714.0)    Shortness of breath     Patient Active Problem List   Diagnosis Date Noted   COPD exacerbation (HCC) 11/17/2018   Asthma without status asthmaticus 09/04/2015   Arthritis, degenerative 09/04/2015   Essential (primary) hypertension 07/01/2015   Gastro-esophageal reflux disease without esophagitis 07/01/2015   Combined fat and carbohydrate induced hyperlipemia 07/01/2015   Rheumatoid arthritis involving multiple joints (HCC) 09/10/2014    Past Surgical History:  Procedure Laterality Date   ANTERIOR CERVICAL DECOMP/DISCECTOMY FUSION  05/05/2012   Procedure: ANTERIOR CERVICAL  DECOMPRESSION/DISCECTOMY FUSION 1 LEVEL;  Surgeon: Karn CassisErnesto M Botero, MD;  Location: [facility];  Service: Neurosurgery;  Laterality: N/A;  Cervical six seven Anterior cervical decompression/diskectomy, Fusion, Plate   COLONOSCOPY WITH PROPOFOL N/A 09/08/2017   Procedure: COLONOSCOPY WITH PROPOFOL;  Surgeon: Christena DeemSkulskie, Martin U, MD;  [facility];  Service: Endoscopy;  Laterality: N/A;   ENDOMETRIAL ABLATION     novasure   FOOT SURGERY  2015   ROTATOR CUFF REPAIR     bilat   TRANSTHORACIC [facility]CHOCARDIOGRAM  2010   [facility]   TUBAL LIGATION      Prior to Admission medications   Medication Sig Start Date End Date Taking? Authorizing Provider  albuterol (PROVENTIL HFA;VENTOLIN HFA) 108 (90 Base) MCG/ACT inhaler Inhale 2 puffs into the lungs every 6 (six) hours as needed for wheezing or shortness of breath.   Yes [provider]  aspirin EC 81 MG tablet Take 81 mg by mouth daily.   Yes [provider]  ferrous sulfate 325 (65 FE) MG tablet Take 1 tablet (325 mg total) by mouth 2 (two) times daily with a meal. 11/19/18 09/25/19 Yes Pyreddy, Vivien RotaPavan, MD  hydrochlorothiazide (HYDRODIURIL) 25 MG tablet Take 25 mg by mouth daily.   Yes [provider]  lovastatin (MEVACOR) 20 MG tablet Take 20 mg by mouth at bedtime.   Yes [provider]  metFORMIN (GLUCOPHAGE) 1000 MG tablet Take 1 tablet (1,000 mg total) by mouth 2 (two) times daily with a meal. 11/19/18 09/25/19 Yes Pyreddy, Vivien RotaPavan, MD  Methotrexate 2.5 MG/ML SOLN Take 2.5 mg by mouth once  a week.   Yes [provider]  montelukast (SINGULAIR) 10 MG tablet Take 10 mg by mouth at bedtime.   Yes [provider]  omeprazole (PRILOSEC) 20 MG capsule Take 20 mg by mouth daily.   Yes [provider]  potassium chloride (K-DUR) 10 MEQ tablet Take 1 tablet (10 mEq total) by mouth daily. 11/19/18 09/25/19 Yes Pyreddy, Vivien Rota, MD  telmisartan (MICARDIS) 20 MG tablet Take  20 mg by mouth daily.   Yes [provider]  fluticasone (FLONASE) 50 MCG/ACT nasal spray Place into the nose.    [provider]  Fluticasone-Salmeterol (ADVAIR DISKUS) 250-50 MCG/DOSE AEPB Inhale 1 puff into the lungs 2 (two) times daily. Patient not taking: Reported on 09/25/2019 11/19/18 12/19/18  Ihor Austin, MD  levocetirizine (XYZAL) 5 MG tablet Take 5 mg by mouth every evening.    [provider]  lidocaine (XYLOCAINE) 2 % solution Use as directed 5 mLs in the mouth or throat 3 (three) times daily as needed for mouth pain. Patient not taking: Reported on 09/25/2019 11/19/18   Ihor Austin, MD  magic mouthwash SOLN Take 5 mLs by mouth 3 (three) times daily as needed for mouth pain. Patient not taking: Reported on 09/25/2019 11/19/18   Ihor Austin, MD  methotrexate (RHEUMATREX) 2.5 MG tablet Take 1 tablet (2.5 mg total) by mouth once a week. Patient not taking: Reported on 09/25/2019 11/19/18   Ihor Austin, MD  predniSONE (DELTASONE) 10 MG tablet Take 1 tablet (10 mg total) by mouth daily. Label  & dispense according to the schedule below.  6 tablets day one, then 5 table day 2, then 4 tablets day 3, then 3 tablets day 4, 2 tablets day 5, then 1 tablet day 6, then stop Patient not taking: Reported on 09/25/2019 11/19/18   Ihor Austin, MD    Allergies Penicillins, Azithromycin, Levofloxacin, and Sulfa antibiotics  Family History  Problem Relation Age of Onset   Breast cancer Paternal Aunt     Social History Social History   Tobacco Use   Smoking status: Never Smoker   Smokeless tobacco: Never Used  Substance Use Topics   Alcohol use: No    Alcohol/week: 0.0 standard drinks   Drug use: No    Review of Systems  Constitutional: Positive for fever. Eyes: No redness. ENT: No sore throat. Cardiovascular: Denies chest pain. Respiratory: Positive for shortness of breath. Gastrointestinal: No vomiting or diarrhea.  Genitourinary: Negative  for dysuria.  Musculoskeletal: Negative for back pain. Skin: Negative for rash. Neurological: Negative for headache.   ____________________________________________   PHYSICAL EXAM:  VITAL SIGNS: ED Triage Vitals  Enc Vitals Group     BP 09/25/19 1516 122/63     Pulse Rate 09/25/19 1516 (!) 107     Resp 09/25/19 1516 16     Temp 09/25/19 1516 (!) 101.7 F (38.7 C)     Temp Source 09/25/19 1516 Oral     SpO2 09/25/19 1516 97 %     Weight 09/25/19 1517 164 lb (74.4 kg)     Height 09/25/19 1517 5\' 6"  (1.676 m)     Head Circumference --      Peak Flow --      Pain Score 09/25/19 1517 0     Pain Loc --      Pain Edu? --      Excl. in GC? --     Constitutional: Alert and oriented. Well appearing and in no acute distress. Eyes: Conjunctivae are normal.  Head: Atraumatic. Nose: No congestion/rhinnorhea. Mouth/Throat: Mucous membranes are moist.   Neck: Normal range of motion.  Cardiovascular: Normal rate, regular rhythm. Good peripheral circulation. Respiratory: Normal respiratory effort.  No retractions.  Gastrointestinal: No distention.  Musculoskeletal: No lower extremity edema.  Extremities warm and well perfused.  Neurologic:  Normal speech and language. No gross focal neurologic deficits are appreciated.  Skin:  Skin is warm and dry. No rash noted. Psychiatric: Mood and affect are normal. Speech and behavior are normal.  ____________________________________________   LABS (all labs ordered are listed, but only abnormal results are displayed)  Labs Reviewed  COMPREHENSIVE METABOLIC PANEL - Abnormal; Notable for the following components:      Result Value   Chloride 93 (*)    Glucose, Bld 137 (*)    Creatinine, Ser 1.45 (*)    Calcium 8.7 (*)    GFR calc non Af Amer 37 (*)    GFR calc Af Amer 43 (*)    All other components within normal limits  CBC WITH DIFFERENTIAL/PLATELET - Abnormal; Notable for the following components:   Hemoglobin 11.8 (*)    Lymphs Abs  0.5 (*)    All other components within normal limits  URINALYSIS, COMPLETE (UACMP) WITH MICROSCOPIC - Abnormal; Notable for the following components:   Color, Urine YELLOW (*)    APPearance HAZY (*)    Protein, ur 30 (*)    All other components within normal limits  SARS CORONAVIRUS 2 (TAT 6-24 HRS)  LACTIC ACID, PLASMA   ____________________________________________  EKG  ED ECG REPORT I, Arta Silence, the attending physician, personally viewed and interpreted this ECG.  Date: 09/25/2019 EKG Time: 1518 Rate: 109 Rhythm: Tachycardia sinus tachycardia QRS Axis: normal Intervals: normal ST/T Wave abnormalities: normal Narrative Interpretation: no evidence of acute ischemia  ____________________________________________  RADIOLOGY  CXR: Left lower lung interstitial infiltrates  ____________________________________________   PROCEDURES  Procedure(s) performed: No  Procedures  Critical Care performed: No ____________________________________________   INITIAL IMPRESSION / ASSESSMENT AND PLAN / ED COURSE  Pertinent labs & imaging results that were available during my care of the patient were reviewed by me and considered in my medical decision making (see chart for details).  68 year old female with PMH as noted above including COPD presents with cough, shortness of breath, and fever.  She was diagnosed by her pulmonologist with pneumonia and sent to the hospital for further evaluation and likely admission.  I reviewed the past medical records in [place] and confirmed the history above.  The patient completed a course of doxycycline earlier last month and is on steroid.  She was seen by Dr. Raul Del today and diagnosed with left lower lobe pneumonia.  On exam, the patient is relatively comfortable appearing.  She was initially febrile and tachycardic with otherwise normal vital signs.  O2 saturation is borderline low.  She has no respiratory distress  but does have some wheezing bilaterally.  Overall presentation is consistent with pneumonia.  X-ray here confirms left lower and possible middle lobe interstitial infiltrate.  Overall based on x-ray findings, this favors bacterial etiology although Covid is on the differential.  Given the patient's comorbidities, her fever and tachycardia, and the borderline low O2 saturation, as well as the fact that she recently was on oral antibiotics at home, we will admit for IV antibiotics.  ----------------------------------------- 8:24 PM on 09/25/2019 -----------------------------------------  I signed the patient out to the hospitalist Dr. Ara Kussmaul for admission. _______________________________  [name] was evaluated  in Emergency Department on 09/25/2019 for the symptoms described in the history of present illness. She was evaluated in the context of the global COVID-19 pandemic, which necessitated consideration that the patient might be at risk for infection with the SARS-CoV-2 virus that causes COVID-19. Institutional protocols and algorithms that pertain to the evaluation of patients at risk for COVID-19 are in a state of rapid change based on information released by regulatory bodies including the CDC and federal and state organizations. These policies and algorithms were followed during the patient's care in the ED.  ____________________________________________   FINAL CLINICAL IMPRESSION(S) / ED DIAGNOSES  Final diagnoses:  Community acquired pneumonia of left lower lobe of lung      NEW MEDICATIONS STARTED DURING THIS VISIT:  New Prescriptions   No medications on file     Note:  This document was prepared using Dragon voice recognition software and may include unintentional dictation errors.   Dionne Bucy, MD 09/25/19 2024

## 2019-09-25 NOTE — ED Triage Notes (Signed)
Reports cough and SOB. Pt was recently treated by PCP with ABX. Pt started with fever today, went to PCP and dx with pneumonia by chest xray. Nonproductive cough. Pt alert and oriented X4, cooperative, RR even and unlabored, color WNL. Pt in NAD.

## 2019-09-25 NOTE — ED Notes (Signed)
Report to Kim, RN

## 2019-09-26 ENCOUNTER — Inpatient Hospital Stay (HOSPITAL_COMMUNITY)
Admission: EM | Admit: 2019-09-26 | Discharge: 2019-10-01 | DRG: 177 | Disposition: A | Discharge status: Home / Self Care | Payer: Medicare Other | Source: Other Acute Inpatient Hospital | Attending: Internal Medicine | Admitting: Internal Medicine

## 2019-09-26 DIAGNOSIS — J441 Chronic obstructive pulmonary disease with (acute) exacerbation: Secondary | ICD-10-CM | POA: Diagnosis present

## 2019-09-26 DIAGNOSIS — Z79899 Other long term (current) drug therapy: Secondary | ICD-10-CM

## 2019-09-26 DIAGNOSIS — I252 Old myocardial infarction: Secondary | ICD-10-CM

## 2019-09-26 DIAGNOSIS — A4189 Other specified sepsis: Secondary | ICD-10-CM | POA: Diagnosis not present

## 2019-09-26 DIAGNOSIS — Z882 Allergy status to sulfonamides status: Secondary | ICD-10-CM | POA: Diagnosis not present

## 2019-09-26 DIAGNOSIS — N1832 Chronic kidney disease, stage 3b: Secondary | ICD-10-CM | POA: Diagnosis present

## 2019-09-26 DIAGNOSIS — Z7982 Long term (current) use of aspirin: Secondary | ICD-10-CM | POA: Diagnosis not present

## 2019-09-26 DIAGNOSIS — Z803 Family history of malignant neoplasm of breast: Secondary | ICD-10-CM | POA: Diagnosis not present

## 2019-09-26 DIAGNOSIS — R109 Unspecified abdominal pain: Secondary | ICD-10-CM | POA: Diagnosis present

## 2019-09-26 DIAGNOSIS — J45909 Unspecified asthma, uncomplicated: Secondary | ICD-10-CM | POA: Diagnosis not present

## 2019-09-26 DIAGNOSIS — U071 COVID-19: Principal | ICD-10-CM | POA: Diagnosis present

## 2019-09-26 DIAGNOSIS — E1122 Type 2 diabetes mellitus with diabetic chronic kidney disease: Secondary | ICD-10-CM | POA: Diagnosis present

## 2019-09-26 DIAGNOSIS — J1282 Pneumonia due to coronavirus disease 2019: Secondary | ICD-10-CM

## 2019-09-26 DIAGNOSIS — Z7984 Long term (current) use of oral hypoglycemic drugs: Secondary | ICD-10-CM

## 2019-09-26 DIAGNOSIS — K219 Gastro-esophageal reflux disease without esophagitis: Secondary | ICD-10-CM | POA: Diagnosis present

## 2019-09-26 DIAGNOSIS — Z7951 Long term (current) use of inhaled steroids: Secondary | ICD-10-CM | POA: Diagnosis not present

## 2019-09-26 DIAGNOSIS — J1289 Other viral pneumonia: Secondary | ICD-10-CM | POA: Diagnosis present

## 2019-09-26 DIAGNOSIS — Z880 Allergy status to penicillin: Secondary | ICD-10-CM | POA: Diagnosis not present

## 2019-09-26 DIAGNOSIS — J45901 Unspecified asthma with (acute) exacerbation: Secondary | ICD-10-CM | POA: Diagnosis present

## 2019-09-26 DIAGNOSIS — Z981 Arthrodesis status: Secondary | ICD-10-CM

## 2019-09-26 DIAGNOSIS — J440 Chronic obstructive pulmonary disease with acute lower respiratory infection: Secondary | ICD-10-CM | POA: Diagnosis present

## 2019-09-26 DIAGNOSIS — M069 Rheumatoid arthritis, unspecified: Secondary | ICD-10-CM | POA: Diagnosis present

## 2019-09-26 DIAGNOSIS — J9601 Acute respiratory failure with hypoxia: Secondary | ICD-10-CM | POA: Diagnosis present

## 2019-09-26 DIAGNOSIS — Z881 Allergy status to other antibiotic agents status: Secondary | ICD-10-CM

## 2019-09-26 DIAGNOSIS — I10 Essential (primary) hypertension: Secondary | ICD-10-CM | POA: Diagnosis present

## 2019-09-26 DIAGNOSIS — J069 Acute upper respiratory infection, unspecified: Secondary | ICD-10-CM

## 2019-09-26 DIAGNOSIS — E785 Hyperlipidemia, unspecified: Secondary | ICD-10-CM | POA: Diagnosis present

## 2019-09-26 DIAGNOSIS — I129 Hypertensive chronic kidney disease with stage 1 through stage 4 chronic kidney disease, or unspecified chronic kidney disease: Secondary | ICD-10-CM | POA: Diagnosis present

## 2019-09-26 HISTORY — DX: Pneumonia due to coronavirus disease 2019: J12.82

## 2019-09-26 HISTORY — DX: COVID-19: U07.1

## 2019-09-26 LAB — CBC
HCT: 34.5 % — ABNORMAL LOW (ref 36.0–46.0)
Hemoglobin: 11.2 g/dL — ABNORMAL LOW (ref 12.0–15.0)
MCH: 30.4 pg (ref 26.0–34.0)
MCHC: 32.5 g/dL (ref 30.0–36.0)
MCV: 93.8 fL (ref 80.0–100.0)
Platelets: 201 10*3/uL (ref 150–400)
RBC: 3.68 MIL/uL — ABNORMAL LOW (ref 3.87–5.11)
RDW: 14 % (ref 11.5–15.5)
WBC: 5 10*3/uL (ref 4.0–10.5)
nRBC: 0 % (ref 0.0–0.2)

## 2019-09-26 LAB — BASIC METABOLIC PANEL
Anion gap: 13 (ref 5–15)
BUN: 22 mg/dL (ref 8–23)
CO2: 28 mmol/L (ref 22–32)
Calcium: 8 mg/dL — ABNORMAL LOW (ref 8.9–10.3)
Chloride: 97 mmol/L — ABNORMAL LOW (ref 98–111)
Creatinine, Ser: 1.26 mg/dL — ABNORMAL HIGH (ref 0.44–1.00)
GFR calc Af Amer: 51 mL/min — ABNORMAL LOW (ref >60)
GFR calc non Af Amer: 44 mL/min — ABNORMAL LOW (ref >60)
Glucose, Bld: 118 mg/dL — ABNORMAL HIGH (ref 70–99)
Potassium: 3.7 mmol/L (ref 3.5–5.1)
Sodium: 138 mmol/L (ref 135–145)

## 2019-09-26 LAB — PROTIME-INR
INR: 1 (ref 0.8–1.2)
Prothrombin Time: 13.3 seconds (ref 11.4–15.2)

## 2019-09-26 LAB — PHOSPHORUS: Phosphorus: 2.8 mg/dL (ref 2.5–4.6)

## 2019-09-26 LAB — PROCALCITONIN: Procalcitonin: 0.22 ng/mL

## 2019-09-26 LAB — MAGNESIUM: Magnesium: 1.8 mg/dL (ref 1.7–2.4)

## 2019-09-26 LAB — EXPECTORATED SPUTUM ASSESSMENT W GRAM STAIN, RFLX TO RESP C

## 2019-09-26 LAB — C-REACTIVE PROTEIN: CRP: 14.2 mg/dL — ABNORMAL HIGH (ref <1.0)

## 2019-09-26 LAB — FIBRIN DERIVATIVES D-DIMER (ARMC ONLY): Fibrin derivatives D-dimer (ARMC): 593.88 ng/mL (FEU) — ABNORMAL HIGH (ref 0.00–499.00)

## 2019-09-26 LAB — SARS CORONAVIRUS 2 (TAT 6-24 HRS): SARS Coronavirus 2: POSITIVE — AB

## 2019-09-26 LAB — LACTIC ACID, PLASMA: Lactic Acid, Venous: 1.2 mmol/L (ref 0.5–1.9)

## 2019-09-26 LAB — APTT: aPTT: 30 seconds (ref 24–36)

## 2019-09-26 LAB — ABO/RH: ABO/RH(D): A POS

## 2019-09-26 LAB — FERRITIN: Ferritin: 253 ng/mL (ref 11–307)

## 2019-09-26 MED ORDER — ENOXAPARIN SODIUM 40 MG/0.4ML ~~LOC~~ SOLN
40.0000 mg | SUBCUTANEOUS | Status: DC
  Administered 2019-09-26: 40 mg via SUBCUTANEOUS
  Filled 2019-09-26 (×2): qty 0.4

## 2019-09-26 MED ORDER — OXYCODONE HCL 5 MG PO TABS: 5.0000 mg | ORAL_TABLET | ORAL | Status: DC | PRN

## 2019-09-26 MED ORDER — ACETAMINOPHEN 650 MG RE SUPP: 650.0000 mg | Freq: Four times a day (QID) | RECTAL | Status: DC | PRN

## 2019-09-26 MED ORDER — SODIUM CHLORIDE 0.9 % IV SOLN
100.0000 mg | INTRAVENOUS | Status: AC
  Administered 2019-09-27 – 2019-09-30 (×4): 100 mg via INTRAVENOUS
  Filled 2019-09-26 (×4): qty 20

## 2019-09-26 MED ORDER — POTASSIUM CHLORIDE CRYS ER 20 MEQ PO TBCR
10.0000 meq | EXTENDED_RELEASE_TABLET | Freq: Every day | ORAL | Status: DC
  Administered 2019-09-26: 10 meq via ORAL
  Filled 2019-09-26: qty 1

## 2019-09-26 MED ORDER — PRAVASTATIN SODIUM 20 MG PO TABS
20.0000 mg | ORAL_TABLET | Freq: Every day | ORAL | Status: DC
  Filled 2019-09-26: qty 1

## 2019-09-26 MED ORDER — MAGNESIUM CITRATE PO SOLN: 1.0000 | Freq: Once | ORAL | Status: DC | PRN

## 2019-09-26 MED ORDER — SODIUM CHLORIDE 0.9 % IV SOLN
200.0000 mg | Freq: Once | INTRAVENOUS | Status: AC
  Administered 2019-09-26: 05:00:00 200 mg via INTRAVENOUS
  Filled 2019-09-26: qty 40

## 2019-09-26 MED ORDER — GUAIFENESIN-DM 100-10 MG/5ML PO SYRP
10.0000 mL | ORAL_SOLUTION | ORAL | Status: DC | PRN
  Administered 2019-09-26 – 2019-09-30 (×6): 10 mL via ORAL
  Filled 2019-09-26 (×6): qty 10

## 2019-09-26 MED ORDER — IRBESARTAN 75 MG PO TABS
75.0000 mg | ORAL_TABLET | Freq: Every day | ORAL | Status: DC
  Administered 2019-09-26: 75 mg via ORAL
  Filled 2019-09-26: qty 1

## 2019-09-26 MED ORDER — DEXAMETHASONE 4 MG PO TABS
6.0000 mg | ORAL_TABLET | ORAL | Status: DC
  Administered 2019-09-26: 6 mg via ORAL
  Filled 2019-09-26: qty 1.5

## 2019-09-26 MED ORDER — FUROSEMIDE 40 MG PO TABS
20.0000 mg | ORAL_TABLET | Freq: Every day | ORAL | Status: DC
  Administered 2019-09-26: 20 mg via ORAL
  Filled 2019-09-26: qty 1

## 2019-09-26 MED ORDER — ENOXAPARIN SODIUM 40 MG/0.4ML ~~LOC~~ SOLN
40.0000 mg | SUBCUTANEOUS | Status: DC
  Administered 2019-09-27 – 2019-10-01 (×5): 40 mg via SUBCUTANEOUS
  Filled 2019-09-26 (×5): qty 0.4

## 2019-09-26 MED ORDER — ZINC SULFATE 220 (50 ZN) MG PO CAPS
220.0000 mg | ORAL_CAPSULE | Freq: Every day | ORAL | Status: DC
  Administered 2019-09-26 – 2019-10-01 (×6): 220 mg via ORAL
  Filled 2019-09-26 (×6): qty 1

## 2019-09-26 MED ORDER — LORATADINE 10 MG PO TABS: 5.0000 mg | ORAL_TABLET | Freq: Every evening | ORAL | Status: DC

## 2019-09-26 MED ORDER — SODIUM CHLORIDE 0.9 % IV SOLN
100.0000 mg | INTRAVENOUS | Status: DC
  Filled 2019-09-26: qty 20

## 2019-09-26 MED ORDER — PANTOPRAZOLE SODIUM 40 MG PO TBEC
40.0000 mg | DELAYED_RELEASE_TABLET | Freq: Every day | ORAL | Status: DC
  Administered 2019-09-26: 40 mg via ORAL
  Filled 2019-09-26: qty 1

## 2019-09-26 MED ORDER — SODIUM CHLORIDE 0.9 % IV SOLN: 500.0000 mg | INTRAVENOUS | Status: DC

## 2019-09-26 MED ORDER — DEXAMETHASONE SODIUM PHOSPHATE 10 MG/ML IJ SOLN
6.0000 mg | INTRAMUSCULAR | Status: DC
  Administered 2019-09-27: 6 mg via INTRAVENOUS
  Filled 2019-09-26: qty 1

## 2019-09-26 MED ORDER — ALBUTEROL SULFATE HFA 108 (90 BASE) MCG/ACT IN AERS
2.0000 | INHALATION_SPRAY | Freq: Four times a day (QID) | RESPIRATORY_TRACT | Status: DC | PRN
  Administered 2019-09-26: 2 via RESPIRATORY_TRACT
  Filled 2019-09-26: qty 6.7

## 2019-09-26 MED ORDER — FLUTICASONE FUROATE-VILANTEROL 100-25 MCG/INH IN AEPB
1.0000 | INHALATION_SPRAY | Freq: Every day | RESPIRATORY_TRACT | Status: DC
  Administered 2019-09-26: 1 via RESPIRATORY_TRACT
  Filled 2019-09-26 (×2): qty 28

## 2019-09-26 MED ORDER — SENNOSIDES-DOCUSATE SODIUM 8.6-50 MG PO TABS: 1.0000 | ORAL_TABLET | Freq: Every evening | ORAL | Status: DC | PRN

## 2019-09-26 MED ORDER — ALBUTEROL SULFATE (2.5 MG/3ML) 0.083% IN NEBU
2.5000 mg | INHALATION_SOLUTION | Freq: Four times a day (QID) | RESPIRATORY_TRACT | Status: DC | PRN
  Administered 2019-09-26: 2.5 mg via RESPIRATORY_TRACT
  Filled 2019-09-26: qty 3

## 2019-09-26 MED ORDER — ONDANSETRON HCL 4 MG PO TABS: 4.0000 mg | ORAL_TABLET | Freq: Four times a day (QID) | ORAL | Status: DC | PRN

## 2019-09-26 MED ORDER — SODIUM CHLORIDE 0.9% FLUSH
3.0000 mL | Freq: Two times a day (BID) | INTRAVENOUS | Status: DC
  Administered 2019-09-27 – 2019-10-01 (×9): 3 mL via INTRAVENOUS

## 2019-09-26 MED ORDER — ACETAMINOPHEN 325 MG PO TABS
650.0000 mg | ORAL_TABLET | Freq: Four times a day (QID) | ORAL | Status: DC | PRN
  Administered 2019-09-27: 650 mg via ORAL
  Filled 2019-09-26: qty 2

## 2019-09-26 MED ORDER — SODIUM CHLORIDE 0.9 % IV SOLN
INTRAVENOUS | Status: DC
  Administered 2019-09-26: 03:00:00 via INTRAVENOUS

## 2019-09-26 MED ORDER — ASPIRIN EC 81 MG PO TBEC
81.0000 mg | DELAYED_RELEASE_TABLET | Freq: Every day | ORAL | Status: DC
  Administered 2019-09-26: 81 mg via ORAL
  Filled 2019-09-26: qty 1

## 2019-09-26 MED ORDER — SODIUM CHLORIDE 0.9 % IV SOLN: 2.0000 g | INTRAVENOUS | Status: DC

## 2019-09-26 MED ORDER — SODIUM CHLORIDE 0.9% FLUSH: 3.0000 mL | INTRAVENOUS | Status: DC | PRN

## 2019-09-26 MED ORDER — FOLIC ACID 1 MG PO TABS
2.0000 mg | ORAL_TABLET | Freq: Every day | ORAL | Status: DC
  Administered 2019-09-26: 2 mg via ORAL
  Filled 2019-09-26: qty 2

## 2019-09-26 MED ORDER — ACETAMINOPHEN 325 MG PO TABS
650.0000 mg | ORAL_TABLET | Freq: Four times a day (QID) | ORAL | Status: DC | PRN
  Administered 2019-09-26: 650 mg via ORAL
  Filled 2019-09-26: qty 2

## 2019-09-26 MED ORDER — HYDROCHLOROTHIAZIDE 25 MG PO TABS
25.0000 mg | ORAL_TABLET | Freq: Every day | ORAL | Status: DC
  Administered 2019-09-26: 25 mg via ORAL
  Filled 2019-09-26: qty 1

## 2019-09-26 MED ORDER — HYDROCOD POLST-CPM POLST ER 10-8 MG/5ML PO SUER
5.0000 mL | Freq: Two times a day (BID) | ORAL | Status: DC | PRN
  Administered 2019-09-27 – 2019-09-30 (×7): 5 mL via ORAL
  Filled 2019-09-26 (×7): qty 5

## 2019-09-26 MED ORDER — FERROUS SULFATE 325 (65 FE) MG PO TABS
325.0000 mg | ORAL_TABLET | Freq: Two times a day (BID) | ORAL | Status: DC
  Administered 2019-09-26: 325 mg via ORAL
  Filled 2019-09-26 (×3): qty 1

## 2019-09-26 MED ORDER — VITAMIN C 500 MG PO TABS
500.0000 mg | ORAL_TABLET | Freq: Every day | ORAL | Status: DC
  Administered 2019-09-26 – 2019-10-01 (×6): 500 mg via ORAL
  Filled 2019-09-26 (×6): qty 1

## 2019-09-26 MED ORDER — ONDANSETRON HCL 4 MG/2ML IJ SOLN: 4.0000 mg | Freq: Four times a day (QID) | INTRAMUSCULAR | Status: DC | PRN

## 2019-09-26 MED ORDER — BISACODYL 5 MG PO TBEC: 5.0000 mg | DELAYED_RELEASE_TABLET | Freq: Every day | ORAL | Status: DC | PRN

## 2019-09-26 MED ORDER — BENZONATATE 100 MG PO CAPS: 100.0000 mg | ORAL_CAPSULE | Freq: Three times a day (TID) | ORAL | Status: DC | PRN

## 2019-09-26 MED ORDER — FLUTICASONE-UMECLIDIN-VILANT 100-62.5-25 MCG/INH IN AEPB: 1.0000 | INHALATION_SPRAY | Freq: Every day | RESPIRATORY_TRACT | Status: DC

## 2019-09-26 MED ORDER — UMECLIDINIUM BROMIDE 62.5 MCG/INH IN AEPB
1.0000 | INHALATION_SPRAY | Freq: Every day | RESPIRATORY_TRACT | Status: DC
  Administered 2019-09-26: 1 via RESPIRATORY_TRACT
  Filled 2019-09-26: qty 7

## 2019-09-26 MED ORDER — MONTELUKAST SODIUM 10 MG PO TABS
10.0000 mg | ORAL_TABLET | Freq: Every day | ORAL | Status: DC
  Administered 2019-09-26: 10 mg via ORAL
  Filled 2019-09-26: qty 1

## 2019-09-26 MED ORDER — IPRATROPIUM BROMIDE 0.02 % IN SOLN: 0.5000 mg | Freq: Four times a day (QID) | RESPIRATORY_TRACT | Status: DC | PRN

## 2019-09-26 MED ORDER — SODIUM CHLORIDE 0.9 % IV SOLN: 250.0000 mL | INTRAVENOUS | Status: DC | PRN

## 2019-09-26 NOTE — ED Notes (Signed)
Message sent to pharmacy requesting patient's medications.

## 2019-09-26 NOTE — ED Notes (Signed)
[facility] arrived to transport patient to [facility].

## 2019-09-26 NOTE — H&P (Signed)
History and Physical    Misty Briggs FAO:[id number] DOB: 04/26/1951 DOA: 09/26/2019  PCP: Jerl MinaHedrick, James, MD  Patient coming from: home  Chief Complaint:  Cough, sob  HPI: Misty MagnusDoris P Willert is a 68 y.o. female with medical history significant of asthma, htn, RA comes in with over a week of sob and abd pain.  No vomiting or diarrhea.  Has been coughing a lot. Tested positive for covid.  sats nml on RA but with bilatearl infiltrates on cxr.  Referred for admission for covid pna.  Review of Systems: As per HPI otherwise 10 point review of systems negative.   Past Medical History:  Diagnosis Date  . Arthritis   . Asthma   . COPD (chronic obstructive pulmonary disease) (HCC)    Dr. Meredeth IdeFleming, [facility] pulmonologist  . Diabetes mellitus   . GERD (gastroesophageal reflux disease)   . Heart murmur   . HLD (hyperlipidemia)   . Hypertension   . Myocardial infarction (HCC)    mild  . PONV (postoperative nausea and vomiting)    itching sometimes  . Rheumatoid arthritis(714.0)   . Shortness of breath     Past Surgical History:  Procedure Laterality Date  . ANTERIOR CERVICAL DECOMP/DISCECTOMY FUSION  05/05/2012   Procedure: ANTERIOR CERVICAL DECOMPRESSION/DISCECTOMY FUSION 1 LEVEL;  Surgeon: Karn CassisErnesto M Botero, MD;  Location: [facility];  Service: Neurosurgery;  Laterality: N/A;  Cervical six seven Anterior cervical decompression/diskectomy, Fusion, Plate  . COLONOSCOPY WITH PROPOFOL N/A 09/08/2017   Procedure: COLONOSCOPY WITH PROPOFOL;  Surgeon: Christena DeemSkulskie, Martin U, MD;  [facility];  Service: Endoscopy;  Laterality: N/A;  . ENDOMETRIAL ABLATION     novasure  . FOOT SURGERY  2015  . ROTATOR CUFF REPAIR     bilat  . TRANSTHORACIC ECHOCARDIOGRAM  2010   [facility]  . TUBAL LIGATION       reports that she has never smoked. She has never used smokeless tobacco. She reports that she does not drink alcohol or use drugs.  Allergies  Allergen Reactions   . Penicillins Hives, Palpitations and Rash  . Azithromycin Other (See Comments)    Thrush  . Levofloxacin Other (See Comments)    Joint pain/muscle pain  . Sulfa Antibiotics Rash    Family History  Problem Relation Age of Onset  . Breast cancer Paternal Aunt     Prior to Admission medications   Medication Sig Start Date End Date Taking? Authorizing Provider  albuterol (PROVENTIL HFA;VENTOLIN HFA) 108 (90 Base) MCG/ACT inhaler Inhale 2 puffs into the lungs every 6 (six) hours as needed for wheezing or shortness of breath.    [provider]  aspirin EC 81 MG tablet Take 81 mg by mouth daily.    [provider]  ferrous sulfate 325 (65 FE) MG tablet Take 1 tablet (325 mg total) by mouth 2 (two) times daily with a meal. 11/19/18 09/25/19  Pyreddy, Vivien RotaPavan, MD  folic acid (FOLVITE) 1 MG tablet Take 2 mg by mouth daily. 09/06/19   [provider]  furosemide (LASIX) 20 MG tablet Take 20 mg by mouth daily as needed. 08/01/19   [provider]  hydrochlorothiazide (HYDRODIURIL) 25 MG tablet Take 25 mg by mouth daily.    [provider]  hydrOXYzine (ATARAX/VISTARIL) 25 MG tablet Take 50 mg by mouth every 8 (eight) hours as needed.  08/23/19   [provider]  levocetirizine (XYZAL) 5 MG tablet Take 5 mg by mouth every evening.  [provider]  lovastatin (MEVACOR) 20 MG tablet Take 20 mg by mouth at bedtime.    [provider]  metFORMIN (GLUCOPHAGE) 1000 MG tablet Take 1 tablet (1,000 mg total) by mouth 2 (two) times daily with a meal. 11/19/18 09/25/19  Saundra Shelling, MD  methotrexate 50 MG/2ML injection Inject 20 mg into the skin once a week. 09/01/19   [provider]  montelukast (SINGULAIR) 10 MG tablet Take 10 mg by mouth at bedtime.    [provider]  omeprazole (PRILOSEC) 20 MG capsule Take 20 mg by mouth daily.    [provider]  potassium chloride (K-DUR) 10 MEQ tablet Take 1 tablet (10  mEq total) by mouth daily. 11/19/18 09/25/19  Saundra Shelling, MD  predniSONE (DELTASONE) 10 MG tablet Take 1 tablet (10 mg total) by mouth daily. Label  & dispense according to the schedule below.  6 tablets day one, then 5 table day 2, then 4 tablets day 3, then 3 tablets day 4, 2 tablets day 5, then 1 tablet day 6, then stop 11/19/18   Saundra Shelling, MD  telmisartan (MICARDIS) 20 MG tablet Take 20 mg by mouth daily.    [provider]  TRELEGY ELLIPTA 100-62.5-25 MCG/INH AEPB Inhale 1 puff into the lungs daily. 09/11/19   [provider]    Physical Exam: Vitals:   09/26/19 1826  BP: (!) 115/57  Pulse: (!) 103  Resp: 15  Temp: 99.4 F (37.4 C)  TempSrc: Oral  SpO2: 93%      Constitutional: NAD, calm, comfortable Vitals:   09/26/19 1826  BP: (!) 115/57  Pulse: (!) 103  Resp: 15  Temp: 99.4 F (37.4 C)  TempSrc: Oral  SpO2: 93%   Eyes: PERRL, lids and conjunctivae normal ENMT: Mucous membranes are moist. Posterior pharynx clear of any exudate or lesions.Normal dentition.  Neck: normal, supple, no masses, no thyromegaly Respiratory: clear to auscultation bilaterally, no wheezing, no crackles. Normal respiratory effort. No accessory muscle use.  Cardiovascular: Regular rate and rhythm, no murmurs / rubs / gallops. No extremity edema. 2+ pedal pulses. No carotid bruits.  Abdomen: no tenderness, no masses palpated. No hepatosplenomegaly. Bowel sounds positive.  Musculoskeletal: no clubbing / cyanosis. No joint deformity upper and lower extremities. Good ROM, no contractures. Normal muscle tone.  Skin: no rashes, lesions, ulcers. No induration Neurologic: CN 2-12 grossly intact. Sensation intact, DTR normal. Strength 5/5 in all 4.  Psychiatric: Normal judgment and insight. Alert and oriented x 3. Normal mood.    Labs on Admission: I have personally reviewed following labs and imaging studies  CBC: Recent Labs  Lab 09/25/19 1520 09/26/19 0440  WBC 7.7 5.0   NEUTROABS 6.9  --   HGB 11.8* 11.2*  HCT 36.3 34.5*  MCV 93.8 93.8  PLT 212 818   Basic Metabolic Panel: Recent Labs  Lab 09/25/19 1520 09/26/19 0440  NA 136 138  K 4.1 3.7  CL 93* 97*  CO2 28 28  GLUCOSE 137* 118*  BUN 22 22  CREATININE 1.45* 1.26*  CALCIUM 8.7* 8.0*  MG  --  1.8  PHOS  --  2.8   GFR: Estimated Creatinine Clearance: 44.7 mL/min (A) (by C-G formula based on SCr of 1.26 mg/dL (H)). Liver Function Tests: Recent Labs  Lab 09/25/19 1520  AST 34  ALT 36  ALKPHOS 103  BILITOT 0.6  PROT 7.0  ALBUMIN 3.6   No results for input(s): LIPASE, AMYLASE in the last 168 hours. No  results for input(s): AMMONIA in the last 168 hours. Coagulation Profile: Recent Labs  Lab 09/26/19 0440  INR 1.0   Cardiac Enzymes: No results for input(s): CKTOTAL, CKMB, CKMBINDEX, TROPONINI in the last 168 hours. BNP (last 3 results) No results for input(s): PROBNP in the last 8760 hours. HbA1C: No results for input(s): HGBA1C in the last 72 hours. CBG: No results for input(s): GLUCAP in the last 168 hours. Lipid Profile: No results for input(s): CHOL, HDL, LDLCALC, TRIG, CHOLHDL, LDLDIRECT in the last 72 hours. Thyroid Function Tests: No results for input(s): TSH, T4TOTAL, FREET4, T3FREE, THYROIDAB in the last 72 hours. Anemia Panel: Recent Labs    09/26/19 0440  FERRITIN 253   Urine analysis:    Component Value Date/Time   COLORURINE YELLOW (A) 09/25/2019 1826   APPEARANCEUR HAZY (A) 09/25/2019 1826   LABSPEC 1.017 09/25/2019 1826   PHURINE 5.0 09/25/2019 1826   GLUCOSEU NEGATIVE 09/25/2019 1826   HGBUR NEGATIVE 09/25/2019 1826   BILIRUBINUR NEGATIVE 09/25/2019 1826   KETONESUR NEGATIVE 09/25/2019 1826   PROTEINUR 30 (A) 09/25/2019 1826   NITRITE NEGATIVE 09/25/2019 1826   LEUKOCYTESUR NEGATIVE 09/25/2019 1826   Sepsis Labs: !!!!!!!!!!!!!!!!!!!!!!!!!!!!!!!!!!!!!!!!!!!! @LABRCNTIP (procalcitonin:4,lacticidven:4) ) Recent Results (from the past 240 hour(s))   SARS CORONAVIRUS 2 (TAT 6-24 HRS) Nasopharyngeal Nasopharyngeal Swab     Status: Abnormal   Collection Time: 09/25/19  7:31 PM   Specimen: Nasopharyngeal Swab  Result Value Ref Range Status   SARS Coronavirus 2 POSITIVE (A) NEGATIVE Final    Comment: RESULT CALLED TO, READ BACK BY AND VERIFIED WITH: R ,RN [id number] 09/26/2019 D BRADLEY (NOTE) SARS-CoV-2 target nucleic acids are DETECTED. The SARS-CoV-2 RNA is generally detectable in upper and lower respiratory specimens during the acute phase of infection. Positive results are indicative of active infection with SARS-CoV-2. Clinical  correlation with patient history and other diagnostic information is necessary to determine patient infection status. Positive results do  not rule out bacterial infection or co-infection with other viruses. The expected result is Negative. Fact Sheet for Patients: 13/02/2019 Fact Sheet for Healthcare Providers: HairSlick.no This test is not yet approved or cleared by the quierodirigir.com FDA and  has been authorized for detection and/or diagnosis of SARS-CoV-2 by FDA under an Emergency Use Authorization (EUA). This EUA will remain  in effect (meaning this test can be used) for t he duration of the COVID-19 declaration under Section 564(b)(1) of the Act, 21 U.S.C. section 360bbb-3(b)(1), unless the authorization is terminated or revoked sooner. Performed at [facility], [address] [place], [place]   Culture, sputum-assessment     Status: None   Collection Time: 09/26/19  8:14 AM   Specimen: Expectorated Sputum  Result Value Ref Range Status   Specimen Description EXPECTORATED SPUTUM  Final   Special Requests NONE  Final   Sputum evaluation   Final    THIS SPECIMEN IS ACCEPTABLE FOR SPUTUM CULTURE Performed at [facility], [address], [place], [place]    Report Status 09/26/2019 FINAL  Final   Culture, respiratory     Status: None (Preliminary result)   Collection Time: 09/26/19  8:14 AM  Result Value Ref Range Status   Specimen Description   Final    EXPECTORATED SPUTUM Performed at [facility], [address] [place], [place]    Special Requests   Final    NONE Reflexed from [id number] Performed at [facility], [address] [place], [place]  Gram Stain   Final    RARE WBC PRESENT, PREDOMINANTLY PMN NO ORGANISMS SEEN Performed at [facility], [address], [place], [place] [address]    Culture PENDING  Incomplete   Report Status PENDING  Incomplete     Radiological Exams on Admission: Dg Chest 2 View  Result Date: 09/25/2019 CLINICAL DATA:  Shortness of breath, nonproductive cough, recently treated with antibiotics for pneumonia, onset of fever today; history asthma, COPD, diabetes mellitus, hypertension EXAM: CHEST - 2 VIEW COMPARISON:  11/17/2018 FINDINGS: Normal heart size, mediastinal contours, and pulmonary vascularity. Interstitial infiltrate at the mid to lower LEFT lung, minimally on RIGHT. Remaining lungs clear. No pleural effusion or pneumothorax. 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 IMPRESSION: Interstitial infiltrates at the mid to lower LEFT lung, minimally on RIGHT. Electronically Signed   By: Ulyses Southward M.D.   On: 09/25/2019 16:04   cxr bilateral opacities Old chart reviewed   Assessment/Plan 68 yo female with bilateral covid pna  Principal Problem:   Pneumonia due to COVID-19 virus- place on decadron and remdisivir.  Also lovenox/vit c and zinc.  Supportive care.  Oxygen nml at this time.    Active Problems:   Asthma without status asthmaticus- no wheezing at this time   Essential (primary) hypertension- stable   Rheumatoid arthritis involving multiple joints (HCC)- stable    DVT prophylaxis:  lovenox Code Status: full Family Communication: none Disposition  Plan:  Day or two Consults called: none Admission status: admission   , A MD [facility]  If 7PM-7AM, please contact night-coverage [email or web address] Password [facility]  09/26/2019, 7:34 PM

## 2019-09-26 NOTE — ED Notes (Signed)
Patient on phone with admitting doctor.

## 2019-09-26 NOTE — ED Notes (Signed)
AVS printed due to hard stop when attempting to D/C patient from ED track board. Pt transferred to [facility] via [place].

## 2019-09-26 NOTE — Progress Notes (Signed)
PROGRESS NOTE    Misty Briggs  EXB:[id number] DOB: 1951/08/12 DOA: 09/25/2019 PCP: Misty Pink, MD      Brief Narrative:  Misty Briggs is a 68 y.o. F with COPD/asthma, RA on MTX/pred, DM, HTN, CAD who presented with few weeks malaise cough, now with new fever for 3 days.  Had had bronchitis for a few weeks, been treated with antibiotics as an outpatient then in the last week, had traveled to [facility] and then developed fever.  In the ER, SARS-CoV-2 positive, CXR with bilateral infiltrates, SpO2 91-92% on room air.     Assessment & Plan:  COVID -Continue remdesivir -Continue Decadron -Albuterol-ipratropium PRN -Cough syrup PRN -Reasonable to continue antibiotics for now, somewhat asymmetric opacities, cannot rule out concomitant acute bacterial pneumonia   Diabetes -Hold metformin -SS correction insulin  Hypertension Coronary disease secondary prevention -Continue aspirin -Continue HCTZ, irbesartan, Lasix/K -Continue pravastatin  Anemia   -Continue iron  COPD  No wheezing -Continue Singulair, PPI, Trelegy  CKD III  Baseline Cr 1.4 since last Dec at least.  Stable relative to baseline  RA No active disease -Hold prednisone (on dex), MTX       MDM and disposition: The below labs and imaging reports were reviewed and summarized above.  Medication management as above.  The patient was admitted with COVID.        DVT prophylaxis: Lovenox Code Status: FULL Family Communication:     Consultants:     Procedures:     Antimicrobials:   Azithromycin  Ceftriaxone    Subjective: Feeeling tired, chest tight, coughing. No confusion, vomiting, chest pain.  Objective: Vitals:   09/26/19 0420 09/26/19 0611 09/26/19 0630 09/26/19 0752  BP:  (!) 130/52 (!) 123/53 130/68  Pulse:  100 94 93  Resp: 18 18  15   Temp: (!) 102.8 F (39.3 C) 99.5 F (37.5 C)    TempSrc: Oral Oral    SpO2:  95% 95% 94%  Weight:      Height:       No intake or  output data in the 24 hours ending 09/26/19 0915 Filed Weights   09/25/19 1517  Weight: 74.4 kg    Examination: General appearance:  adult female, alert and in no acute distress.  Appears tired HEENT: Anicteric, conjunctiva Briggs, lids and lashes normal. No nasal deformity, discharge, epistaxis.  Lips moist, denttion normal.   Skin: Warm and dry.  no jaundice.  No suspicious rashes or lesions. Abdomen: Abdomen soft.  No TTP or guarding. No ascites, distension, hepatosplenomegaly.   MSK: No deformities or effusions. Neuro: Awake and alert.  EOMI, moves all extremities with normal strength and coordination. Speech fluent.    Psych: Sensorium intact and responding to questions, attention normal. Affect stable.  Judgment and insight appear normal.    Data Reviewed: I have personally reviewed following labs and imaging studies:  CBC: Recent Labs  Lab 09/25/19 1520 09/26/19 0440  WBC 7.7 5.0  NEUTROABS 6.9  --   HGB 11.8* 11.2*  HCT 36.3 34.5*  MCV 93.8 93.8  PLT 212 102   Basic Metabolic Panel: Recent Labs  Lab 09/25/19 1520 09/26/19 0440  NA 136 138  K 4.1 3.7  CL 93* 97*  CO2 28 28  GLUCOSE 137* 118*  BUN 22 22  CREATININE 1.45* 1.26*  CALCIUM 8.7* 8.0*  MG  --  1.8  PHOS  --  2.8   GFR: Estimated Creatinine Clearance: 44.7 mL/min (A) (by C-G formula based on SCr  of 1.26 mg/dL (H)). Liver Function Tests: Recent Labs  Lab 09/25/19 1520  AST 34  ALT 36  ALKPHOS 103  BILITOT 0.6  PROT 7.0  ALBUMIN 3.6   No results for input(s): LIPASE, AMYLASE in the last 168 hours. No results for input(s): AMMONIA in the last 168 hours. Coagulation Profile: Recent Labs  Lab 09/26/19 0440  INR 1.0   Cardiac Enzymes: No results for input(s): CKTOTAL, CKMB, CKMBINDEX, TROPONINI in the last 168 hours. BNP (last 3 results) No results for input(s): PROBNP in the last 8760 hours. HbA1C: No results for input(s): HGBA1C in the last 72 hours. CBG: No results for input(s):  GLUCAP in the last 168 hours. Lipid Profile: No results for input(s): CHOL, HDL, LDLCALC, TRIG, CHOLHDL, LDLDIRECT in the last 72 hours. Thyroid Function Tests: No results for input(s): TSH, T4TOTAL, FREET4, T3FREE, THYROIDAB in the last 72 hours. Anemia Panel: Recent Labs    09/26/19 0440  FERRITIN 253   Urine analysis:    Component Value Date/Time   COLORURINE YELLOW (A) 09/25/2019 1826   APPEARANCEUR HAZY (A) 09/25/2019 1826   LABSPEC 1.017 09/25/2019 1826   PHURINE 5.0 09/25/2019 1826   GLUCOSEU NEGATIVE 09/25/2019 1826   HGBUR NEGATIVE 09/25/2019 1826   BILIRUBINUR NEGATIVE 09/25/2019 1826   KETONESUR NEGATIVE 09/25/2019 1826   PROTEINUR 30 (A) 09/25/2019 1826   NITRITE NEGATIVE 09/25/2019 1826   LEUKOCYTESUR NEGATIVE 09/25/2019 1826   Sepsis Labs: @LABRCNTIP (procalcitonin:4,lacticacidven:4)  ) Recent Results (from the past 240 hour(s))  SARS CORONAVIRUS 2 (TAT 6-24 HRS) Nasopharyngeal Nasopharyngeal Swab     Status: Abnormal   Collection Time: 09/25/19  7:31 PM   Specimen: Nasopharyngeal Swab  Result Value Ref Range Status   SARS Coronavirus 2 POSITIVE (A) NEGATIVE Final    Comment: RESULT CALLED TO, READ BACK BY AND VERIFIED WITH: R DAVID,RN 0337 09/26/2019 D BRADLEY (NOTE) SARS-CoV-2 target nucleic acids are DETECTED. The SARS-CoV-2 RNA is generally detectable in upper and lower respiratory specimens during the acute phase of infection. Positive results are indicative of active infection with SARS-CoV-2. Clinical  correlation with patient history and other diagnostic information is necessary to determine patient infection status. Positive results do  not rule out bacterial infection or co-infection with other viruses. The expected result is Negative. Fact Sheet for Patients: 13/02/2019 Fact Sheet for Healthcare Providers: HairSlick.no This test is not yet approved or cleared by the quierodirigir.com FDA  and  has been authorized for detection and/or diagnosis of SARS-CoV-2 by FDA under an Emergency Use Authorization (EUA). This EUA will remain  in effect (meaning this test can be used) for t he duration of the COVID-19 declaration under Section 564(b)(1) of the Act, 21 U.S.C. section 360bbb-3(b)(1), unless the authorization is terminated or revoked sooner. Performed at [facility], [address] [place], [place]   Culture, sputum-assessment     Status: None   Collection Time: 09/26/19  8:14 AM   Specimen: Expectorated Sputum  Result Value Ref Range Status   Specimen Description EXPECTORATED SPUTUM  Final   Special Requests NONE  Final   Sputum evaluation   Final    THIS SPECIMEN IS ACCEPTABLE FOR SPUTUM CULTURE Performed at [facility], [address], [place], [place]    Report Status 09/26/2019 FINAL  Final         Radiology Studies: Dg Chest 2 View  Result Date: 09/25/2019 CLINICAL DATA:  Shortness of breath, nonproductive cough, recently treated with antibiotics for  pneumonia, onset of fever today; history asthma, COPD, diabetes mellitus, hypertension EXAM: CHEST - 2 VIEW COMPARISON:  11/17/2018 FINDINGS: Normal heart size, mediastinal contours, and pulmonary vascularity. Interstitial infiltrate at the mid to lower LEFT lung, minimally on RIGHT. Remaining lungs clear. No pleural effusion or pneumothorax. 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 IMPRESSION: Interstitial infiltrates at the mid to lower LEFT lung, minimally on RIGHT. Electronically Signed   By: Ulyses SouthwardMark  Boles M.D.   On: 09/25/2019 16:04        Scheduled Meds: . aspirin EC  81 mg Oral Daily  . dexamethasone  6 mg Oral Q24H  . enoxaparin (LOVENOX) injection  40 mg Subcutaneous Q24H  . ferrous sulfate  325 mg Oral BID WC  . fluticasone furoate-vilanterol  1 puff Inhalation Daily   And  . umeclidinium bromide  1 puff Inhalation Daily  . folic  acid  2 mg Oral Daily  . furosemide  20 mg Oral Daily  . hydrochlorothiazide  25 mg Oral Daily  . irbesartan  75 mg Oral Daily  . loratadine  5 mg Oral QPM  . montelukast  10 mg Oral QHS  . pantoprazole  40 mg Oral Daily  . potassium chloride SA  10 mEq Oral Daily  . pravastatin  20 mg Oral q1800   Continuous Infusions: . azithromycin Stopped (09/25/19 2130)  . cefTRIAXone (ROCEPHIN)  IV Stopped (09/25/19 2030)  . [START ON 09/27/2019] remdesivir 100 mg in NS 250 mL       LOS: 1 day    Time spent: 25 minutes    Alberteen Samhristopher P Danford, MD [facility] 09/26/2019, 9:15 AM     Please page through AMION:  [email or web address] Password [email or web address] If 7PM-7AM, please contact night-coverage

## 2019-09-26 NOTE — ED Notes (Signed)
Pt given meal tray at this time 

## 2019-09-26 NOTE — ED Notes (Signed)
Admitting doctor at bedside to re-assess patient prior to transfer.

## 2019-09-26 NOTE — ED Notes (Signed)
Pt had 4 beat run of V-tach, printed off and given to EDP, per EDP repeat EKG, EKG repeated and signed by Dr. Jimmye Norman.

## 2019-09-26 NOTE — ED Notes (Signed)
Pt up to the bathroom at this time, back to bed without incident. Urine collected by this RN at this time. Pt denies any needs. Will continue to monitor for further patient needs.

## 2019-09-26 NOTE — Progress Notes (Signed)
Pharmacy Antibiotic Note  Misty Briggs is a 68 y.o. female admitted on 09/25/2019 with pneumonia s/t COVID.  Pharmacy has been consulted for Remdesivir dosing.  Plan:  O:  ALT: 34 CXR: IMPRESSION: Interstitial infiltrates at the mid to lower LEFT lung, minimally on RIGHT. SpO2: 91% on RA w/ Tmax 101.7, tachycardia, tachypnea, lymphopenia 0.5 on differential and positive SARS-CoV2 PCR   A/P:  Remdesivir 200 mg IVPB once followed by 100 mg IVPB daily x 4 days.  Patient also on azithromycin 500 mg IV daily + Ceftriaxone 2g IV daily, will continue to monitor.  Tobie Lords, PharmD, BCPS Clinical Pharmacist 09/26/2019 4:19 AM   Height: 5\' 6"  (167.6 cm) Weight: 164 lb (74.4 kg) IBW/kg (Calculated) : 59.3  Temp (24hrs), Avg:100.3 F (37.9 C), Min:98.8 F (37.1 C), Max:101.7 F (38.7 C)  Recent Labs  Lab 09/25/19 1520  WBC 7.7  CREATININE 1.45*  LATICACIDVEN 0.9    Estimated Creatinine Clearance: 38.8 mL/min (A) (by C-G formula based on SCr of 1.45 mg/dL (H)).    Allergies  Allergen Reactions  . Penicillins Hives, Palpitations and Rash  . Azithromycin Other (See Comments)    Thrush  . Levofloxacin Other (See Comments)    Joint pain/muscle pain  . Sulfa Antibiotics Rash    Thank you for allowing pharmacy to be a part of this patient's care.  Tobie Lords, PharmD, BCPS Clinical Pharmacist 09/26/2019 4:17 AM

## 2019-09-26 NOTE — ED Notes (Signed)
Pt back to bed after going to the bathroom, pt visualized in NAD, placed back on monitor. Pt states finished all of breakfast. Pt denies further needs at this time. Pt remains on 2L via [place]. Will continue to monitor for further patient needs.

## 2019-09-26 NOTE — Progress Notes (Signed)
Pt arrived from [place] via EMS stretcher, A/Ox4, oriented to unit and call light.  VSS, assist x1 OOB.  ST on tele, NAD noted. 09/26/2019 1830 Jennea Rager M Lucee Brissett, RN      09/26/19 1826  Vitals  Temp 99.4 F (37.4 C)  Temp Source Oral  BP (!) 115/57  MAP (mmHg) 73  BP Location Right Arm  BP Method Automatic  Patient Position (if appropriate) Sitting  Pulse Rate (!) 103  Pulse Rate Source Monitor  ECG Heart Rate (!) 103  Resp 15  Oxygen Therapy  SpO2 93 %  O2 Device Room Air  MEWS Score  MEWS RR 0  MEWS Pulse 1  MEWS Systolic 0  MEWS LOC 0  MEWS Temp 0  MEWS Score 1  MEWS Score Color Green

## 2019-09-27 DIAGNOSIS — U071 COVID-19: Principal | ICD-10-CM

## 2019-09-27 DIAGNOSIS — J45909 Unspecified asthma, uncomplicated: Secondary | ICD-10-CM

## 2019-09-27 DIAGNOSIS — J1289 Other viral pneumonia: Secondary | ICD-10-CM

## 2019-09-27 LAB — CBC WITH DIFFERENTIAL/PLATELET
Abs Immature Granulocytes: 0.03 10*3/uL (ref 0.00–0.07)
Basophils Absolute: 0 10*3/uL (ref 0.0–0.1)
Basophils Relative: 0 %
Eosinophils Absolute: 0 10*3/uL (ref 0.0–0.5)
Eosinophils Relative: 0 %
HCT: 35.1 % — ABNORMAL LOW (ref 36.0–46.0)
Hemoglobin: 11.2 g/dL — ABNORMAL LOW (ref 12.0–15.0)
Immature Granulocytes: 1 %
Lymphocytes Relative: 13 %
Lymphs Abs: 0.6 10*3/uL — ABNORMAL LOW (ref 0.7–4.0)
MCH: 30.2 pg (ref 26.0–34.0)
MCHC: 31.9 g/dL (ref 30.0–36.0)
MCV: 94.6 fL (ref 80.0–100.0)
Monocytes Absolute: 0.4 10*3/uL (ref 0.1–1.0)
Monocytes Relative: 7 %
Neutro Abs: 3.8 10*3/uL (ref 1.7–7.7)
Neutrophils Relative %: 79 %
Platelets: 224 10*3/uL (ref 150–400)
RBC: 3.71 MIL/uL — ABNORMAL LOW (ref 3.87–5.11)
RDW: 13.9 % (ref 11.5–15.5)
WBC: 4.8 10*3/uL (ref 4.0–10.5)
nRBC: 0 % (ref 0.0–0.2)

## 2019-09-27 LAB — D-DIMER, QUANTITATIVE: D-Dimer, Quant: 0.56 ug/mL-FEU — ABNORMAL HIGH (ref 0.00–0.50)

## 2019-09-27 LAB — COMPREHENSIVE METABOLIC PANEL
ALT: 30 U/L (ref 0–44)
AST: 32 U/L (ref 15–41)
Albumin: 3.2 g/dL — ABNORMAL LOW (ref 3.5–5.0)
Alkaline Phosphatase: 92 U/L (ref 38–126)
Anion gap: 13 (ref 5–15)
BUN: 24 mg/dL — ABNORMAL HIGH (ref 8–23)
CO2: 28 mmol/L (ref 22–32)
Calcium: 8.6 mg/dL — ABNORMAL LOW (ref 8.9–10.3)
Chloride: 96 mmol/L — ABNORMAL LOW (ref 98–111)
Creatinine, Ser: 1.31 mg/dL — ABNORMAL HIGH (ref 0.44–1.00)
GFR calc Af Amer: 49 mL/min — ABNORMAL LOW (ref >60)
GFR calc non Af Amer: 42 mL/min — ABNORMAL LOW (ref >60)
Glucose, Bld: 117 mg/dL — ABNORMAL HIGH (ref 70–99)
Potassium: 3.7 mmol/L (ref 3.5–5.1)
Sodium: 137 mmol/L (ref 135–145)
Total Bilirubin: 0.9 mg/dL (ref 0.3–1.2)
Total Protein: 6.6 g/dL (ref 6.5–8.1)

## 2019-09-27 LAB — LEGIONELLA PNEUMOPHILA SEROGP 1 UR AG: L. pneumophila Serogp 1 Ur Ag: NEGATIVE

## 2019-09-27 LAB — GLUCOSE, CAPILLARY
Glucose-Capillary: 131 mg/dL — ABNORMAL HIGH (ref 70–99)
Glucose-Capillary: 284 mg/dL — ABNORMAL HIGH (ref 70–99)
Glucose-Capillary: 249 mg/dL — ABNORMAL HIGH (ref 70–99)
Glucose-Capillary: 149 mg/dL — ABNORMAL HIGH (ref 70–99)

## 2019-09-27 LAB — C-REACTIVE PROTEIN: CRP: 17.1 mg/dL — ABNORMAL HIGH (ref <1.0)

## 2019-09-27 LAB — ABO/RH: ABO/RH(D): A POS

## 2019-09-27 MED ORDER — INSULIN ASPART 100 UNIT/ML ~~LOC~~ SOLN
0.0000 [IU] | Freq: Every day | SUBCUTANEOUS | Status: DC
  Administered 2019-09-29: 22:00:00 3 [IU] via SUBCUTANEOUS
  Administered 2019-09-30: 2 [IU] via SUBCUTANEOUS

## 2019-09-27 MED ORDER — INSULIN ASPART 100 UNIT/ML ~~LOC~~ SOLN
0.0000 [IU] | Freq: Three times a day (TID) | SUBCUTANEOUS | Status: DC
  Administered 2019-09-27 – 2019-09-28 (×2): 3 [IU] via SUBCUTANEOUS
  Administered 2019-09-28: 08:00:00 1 [IU] via SUBCUTANEOUS
  Administered 2019-09-28: 3 [IU] via SUBCUTANEOUS
  Administered 2019-09-29: 13:00:00 7 [IU] via SUBCUTANEOUS
  Administered 2019-09-29: 08:00:00 2 [IU] via SUBCUTANEOUS
  Administered 2019-09-29: 3 [IU] via SUBCUTANEOUS
  Administered 2019-09-30: 5 [IU] via SUBCUTANEOUS
  Administered 2019-09-30: 2 [IU] via SUBCUTANEOUS
  Administered 2019-09-30: 7 [IU] via SUBCUTANEOUS
  Administered 2019-10-01: 9 [IU] via SUBCUTANEOUS
  Administered 2019-10-01: 1 [IU] via SUBCUTANEOUS

## 2019-09-27 MED ORDER — ALBUTEROL SULFATE HFA 108 (90 BASE) MCG/ACT IN AERS
1.0000 | INHALATION_SPRAY | RESPIRATORY_TRACT | Status: DC | PRN
  Administered 2019-09-27: 20:00:00 2 via RESPIRATORY_TRACT
  Filled 2019-09-27: qty 6.7

## 2019-09-27 MED ORDER — PANTOPRAZOLE SODIUM 40 MG PO TBEC
40.0000 mg | DELAYED_RELEASE_TABLET | Freq: Every day | ORAL | Status: DC
  Administered 2019-09-27 – 2019-10-01 (×5): 40 mg via ORAL
  Filled 2019-09-27 (×5): qty 1

## 2019-09-27 NOTE — Progress Notes (Addendum)
PROGRESS NOTE                                                                                                                                                                                                             Patient Demographics:    Misty Briggs, is a 68 y.o. female, DOB - 15-May-1951, XBJ:[id number]  Admit date - 09/26/2019   Admitting Physician Vianne Bulls, MD  Outpatient Primary MD for the patient is [place] Pink, MD  LOS - 1   No chief complaint on file.      Brief Narrative    Misty Briggs is a 68 y.o. female with medical history significant of asthma, htn, RA comes in with over a week of sob and abd pain.  No vomiting or diarrhea.  Has been coughing a lot. Tested positive for covid.  sats nml on RA but with bilatearl infiltrates on cxr.  Referred for admission for covid pna.   Subjective:    Misty Briggs today patient reports significant cough, report dyspnea, generalized weakness   Assessment  & Plan :    Principal Problem:   Pneumonia due to COVID-19 virus Active Problems:   Asthma without status asthmaticus   Essential (primary) hypertension   Rheumatoid arthritis involving multiple joints ([place])   Pneumonia due to COVID-19 infection. -Patient imaging significant for COVID-19 for pneumonia. -Continue with IV steroids -Continue with IV remdesivir -Continue with vitamin C and zinc -She was encouraged to use incentive spirometry and flutter valve -Continue to trend inflammatory markers closely, especially with CRP elevated at 17 -She was encouraged with incentive spirometry, flutter valve, to get out of bed to chair COVID-19 Labs  Recent Labs    09/26/19 0440 09/27/19 0555  DDIMER  --  0.56*  FERRITIN 253  --   CRP 14.2* 17.1*    Lab Results  Component Value Date   SARSCOV2NAA POSITIVE (A) 09/25/2019   Asthma without status asthmaticus - no wheezing at this time   Essential (primary) hypertension -  stable   Rheumatoid arthritis involving multiple joints ([place]) - stable  Code Status : Full  Family Communication  : D/W patient  Disposition Plan  : Home  Consults  :  None  Procedures  : None  DVT Prophylaxis  :  [place] lovenox  Lab Results  Component Value Date  PLT 224 09/27/2019    Antibiotics  :   Anti-infectives (From admission, onward)   Start     Dose/Rate Route Frequency Ordered Stop   09/27/19 1000  remdesivir 100 mg in sodium chloride 0.9 % 250 mL IVPB     100 mg 500 mL/hr over 30 Minutes Intravenous Every 24 hours 09/26/19 1944 10/01/19 0959        Objective:   Vitals:   09/26/19 2016 09/27/19 0035 09/27/19 0317 09/27/19 0753  BP: (!) 151/73 91/72 130/74 117/82  Pulse: (!) 103 (!) 105 93 90  Resp: (!) 23 (!) 21 20 16   Temp: 99.6 F (37.6 C) 100.1 F (37.8 C) 99.8 F (37.7 C) 98.9 F (37.2 C)  TempSrc:  Oral Oral Oral  SpO2: 98% 96% 95% 91%    Wt Readings from Last 3 Encounters:  09/25/19 74.4 kg  11/19/18 74.3 kg  09/08/17 72.6 kg    No intake or output data in the 24 hours ending 09/27/19 1139   Physical Exam  Awake Alert, Oriented X 3, No new F.N deficits, Normal affect Symmetrical Chest wall movement, Good air movement bilaterally, B/L rales RRR,No Gallops,Rubs or new Murmurs, No Parasternal Heave +ve B.Sounds, Abd Soft, No tenderness,  No rebound - guarding or rigidity. No Cyanosis, Clubbing or edema, No new Rash or bruise      Data Review:    CBC Recent Labs  Lab 09/25/19 1520 09/26/19 0440 09/27/19 0555  WBC 7.7 5.0 4.8  HGB 11.8* 11.2* 11.2*  HCT 36.3 34.5* 35.1*  PLT 212 201 224  MCV 93.8 93.8 94.6  MCH 30.5 30.4 30.2  MCHC 32.5 32.5 31.9  RDW 14.0 14.0 13.9  LYMPHSABS 0.5*  --  0.6*  MONOABS 0.4  --  0.4  EOSABS 0.0  --  0.0  BASOSABS 0.0  --  0.0    Chemistries  Recent Labs  Lab 09/25/19 1520 09/26/19 0440 09/27/19 0555  NA 136 138 137  K 4.1 3.7 3.7  CL 93* 97* 96*  CO2 28 28 28   GLUCOSE 137* 118*  117*  BUN 22 22 24*  CREATININE 1.45* 1.26* 1.31*  CALCIUM 8.7* 8.0* 8.6*  MG  --  1.8  --   AST 34  --  32  ALT 36  --  30  ALKPHOS 103  --  92  BILITOT 0.6  --  0.9   ------------------------------------------------------------------------------------------------------------------ No results for input(s): CHOL, HDL, LDLCALC, TRIG, CHOLHDL, LDLDIRECT in the last 72 hours.  No results found for: HGBA1C ------------------------------------------------------------------------------------------------------------------ No results for input(s): TSH, T4TOTAL, T3FREE, THYROIDAB in the last 72 hours.  Invalid input(s): FREET3 ------------------------------------------------------------------------------------------------------------------ Recent Labs    09/26/19 0440  FERRITIN 253    Coagulation profile Recent Labs  Lab 09/26/19 0440  INR 1.0    Recent Labs    09/27/19 0555  DDIMER 0.56*    Cardiac Enzymes No results for input(s): CKMB, TROPONINI, MYOGLOBIN in the last 168 hours.  Invalid input(s): CK ------------------------------------------------------------------------------------------------------------------ No results found for: BNP  Inpatient Medications  Scheduled Meds: . dexamethasone (DECADRON) injection  6 mg Intravenous Q24H  . enoxaparin (LOVENOX) injection  40 mg Subcutaneous Q24H  . sodium chloride flush  3 mL Intravenous Q12H  . vitamin C  500 mg Oral Daily  . zinc sulfate  220 mg Oral Daily   Continuous Infusions: . sodium chloride    . remdesivir 100 mg in NS 250 mL 100 mg (09/27/19 0933)   PRN Meds:.sodium chloride, acetaminophen, chlorpheniramine-HYDROcodone,  guaiFENesin-dextromethorphan, ondansetron **OR** ondansetron (ZOFRAN) IV, sodium chloride flush  Micro Results Recent Results (from the past 240 hour(s))  SARS CORONAVIRUS 2 (TAT 6-24 HRS) Nasopharyngeal Nasopharyngeal Swab     Status: Abnormal   Collection Time: 09/25/19  7:31 PM    Specimen: Nasopharyngeal Swab  Result Value Ref Range Status   SARS Coronavirus 2 POSITIVE (A) NEGATIVE Final    Comment: RESULT CALLED TO, READ BACK BY AND VERIFIED WITH: R DAVID,RN 4098 09/26/2019 D BRADLEY (NOTE) SARS-CoV-2 target nucleic acids are DETECTED. The SARS-CoV-2 RNA is generally detectable in upper and lower respiratory specimens during the acute phase of infection. Positive results are indicative of active infection with SARS-CoV-2. Clinical  correlation with patient history and other diagnostic information is necessary to determine patient infection status. Positive results do  not rule out bacterial infection or co-infection with other viruses. The expected result is Negative. Fact Sheet for Patients: HairSlick.no Fact Sheet for Healthcare Providers: quierodirigir.com This test is not yet approved or cleared by the [place] FDA and  has been authorized for detection and/or diagnosis of SARS-CoV-2 by FDA under an Emergency Use Authorization (EUA). This EUA will remain  in effect (meaning this test can be used) for t he duration of the COVID-19 declaration under Section 564(b)(1) of the Act, 21 U.S.C. section 360bbb-3(b)(1), unless the authorization is terminated or revoked sooner. Performed at [facility], [address], [place], [place] [address]   Culture, sputum-assessment     Status: None   Collection Time: 09/26/19  8:14 AM   Specimen: Expectorated Sputum  Result Value Ref Range Status   Specimen Description EXPECTORATED SPUTUM  Final   Special Requests NONE  Final   Sputum evaluation   Final    THIS SPECIMEN IS ACCEPTABLE FOR SPUTUM CULTURE Performed at [facility], [address] [place], [place] [address]    Report Status 09/26/2019 FINAL  Final  Culture, respiratory     Status: None (Preliminary result)   Collection Time: 09/26/19  8:14 AM  Result Value Ref Range Status    Specimen Description   Final    EXPECTORATED SPUTUM Performed at [facility], [address] [place], [place] [address]    Special Requests   Final    NONE Reflexed from [id number] Performed at [facility], [address] [place], [place] [address]    Gram Stain   Final    RARE WBC PRESENT, PREDOMINANTLY PMN NO ORGANISMS SEEN    Culture   Final    CULTURE REINCUBATED FOR BETTER GROWTH Performed at [facility], [address] [place], [place] [address]    Report Status PENDING  Incomplete    Radiology Reports Dg Chest 2 View  Result Date: 09/25/2019 CLINICAL DATA:  Shortness of breath, nonproductive cough, recently treated with antibiotics for pneumonia, onset of fever today; history asthma, COPD, diabetes mellitus, hypertension EXAM: CHEST - 2 VIEW COMPARISON:  11/17/2018 FINDINGS: Normal heart size, mediastinal contours, and pulmonary vascularity. Interstitial infiltrate at the mid to lower LEFT lung, minimally on RIGHT. Remaining lungs clear. No pleural effusion or pneumothorax. 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 IMPRESSION: Interstitial infiltrates at the mid to lower LEFT lung, minimally on RIGHT. Electronically Signed   By: Ulyses Southward M.D.   On: 09/25/2019 16:04     Huey Bienenstock M.D on 09/27/2019 at 11:39 AM  Between 7am to 7pm - Pager - [phone]  After 7pm go to [email or web address] - password  [facility] -  Office  [phone]

## 2019-09-27 NOTE — Progress Notes (Signed)
Patient did not want me to call and update her family, said that she would do it herself.

## 2019-09-27 NOTE — Progress Notes (Addendum)
Notified MD of wheezing in [facility], order received for inhaler.

## 2019-09-27 NOTE — Progress Notes (Signed)
Patient deferred family update phone call. 

## 2019-09-28 ENCOUNTER — Encounter (HOSPITAL_COMMUNITY): Payer: Self-pay

## 2019-09-28 ENCOUNTER — Other Ambulatory Visit: Payer: Self-pay

## 2019-09-28 DIAGNOSIS — M069 Rheumatoid arthritis, unspecified: Secondary | ICD-10-CM

## 2019-09-28 DIAGNOSIS — J441 Chronic obstructive pulmonary disease with (acute) exacerbation: Secondary | ICD-10-CM

## 2019-09-28 DIAGNOSIS — I10 Essential (primary) hypertension: Secondary | ICD-10-CM

## 2019-09-28 LAB — CBC WITH DIFFERENTIAL/PLATELET
Abs Immature Granulocytes: 0.02 10*3/uL (ref 0.00–0.07)
Basophils Absolute: 0 10*3/uL (ref 0.0–0.1)
Basophils Relative: 0 %
Eosinophils Absolute: 0 10*3/uL (ref 0.0–0.5)
Eosinophils Relative: 0 %
HCT: 34.5 % — ABNORMAL LOW (ref 36.0–46.0)
Hemoglobin: 10.9 g/dL — ABNORMAL LOW (ref 12.0–15.0)
Immature Granulocytes: 1 %
Lymphocytes Relative: 15 %
Lymphs Abs: 0.6 10*3/uL — ABNORMAL LOW (ref 0.7–4.0)
MCH: 30.3 pg (ref 26.0–34.0)
MCHC: 31.6 g/dL (ref 30.0–36.0)
MCV: 95.8 fL (ref 80.0–100.0)
Monocytes Absolute: 0.3 10*3/uL (ref 0.1–1.0)
Monocytes Relative: 8 %
Neutro Abs: 3.2 10*3/uL (ref 1.7–7.7)
Neutrophils Relative %: 76 %
Platelets: 252 10*3/uL (ref 150–400)
RBC: 3.6 MIL/uL — ABNORMAL LOW (ref 3.87–5.11)
RDW: 13.4 % (ref 11.5–15.5)
WBC: 4.2 10*3/uL (ref 4.0–10.5)
nRBC: 0 % (ref 0.0–0.2)

## 2019-09-28 LAB — COMPREHENSIVE METABOLIC PANEL
ALT: 29 U/L (ref 0–44)
AST: 27 U/L (ref 15–41)
Albumin: 3.1 g/dL — ABNORMAL LOW (ref 3.5–5.0)
Alkaline Phosphatase: 86 U/L (ref 38–126)
Anion gap: 13 (ref 5–15)
BUN: 39 mg/dL — ABNORMAL HIGH (ref 8–23)
CO2: 28 mmol/L (ref 22–32)
Calcium: 8.7 mg/dL — ABNORMAL LOW (ref 8.9–10.3)
Chloride: 96 mmol/L — ABNORMAL LOW (ref 98–111)
Creatinine, Ser: 1.36 mg/dL — ABNORMAL HIGH (ref 0.44–1.00)
GFR calc Af Amer: 47 mL/min — ABNORMAL LOW (ref >60)
GFR calc non Af Amer: 40 mL/min — ABNORMAL LOW (ref >60)
Glucose, Bld: 156 mg/dL — ABNORMAL HIGH (ref 70–99)
Potassium: 4.4 mmol/L (ref 3.5–5.1)
Sodium: 137 mmol/L (ref 135–145)
Total Bilirubin: 0.9 mg/dL (ref 0.3–1.2)
Total Protein: 6.2 g/dL — ABNORMAL LOW (ref 6.5–8.1)

## 2019-09-28 LAB — GLUCOSE, CAPILLARY
Glucose-Capillary: 134 mg/dL — ABNORMAL HIGH (ref 70–99)
Glucose-Capillary: 218 mg/dL — ABNORMAL HIGH (ref 70–99)
Glucose-Capillary: 215 mg/dL — ABNORMAL HIGH (ref 70–99)
Glucose-Capillary: 191 mg/dL — ABNORMAL HIGH (ref 70–99)

## 2019-09-28 LAB — HEMOGLOBIN A1C
Hgb A1c MFr Bld: 7.4 % — ABNORMAL HIGH (ref 4.8–5.6)
Mean Plasma Glucose: 165.68 mg/dL

## 2019-09-28 LAB — D-DIMER, QUANTITATIVE: D-Dimer, Quant: 0.45 ug/mL-FEU (ref 0.00–0.50)

## 2019-09-28 LAB — C-REACTIVE PROTEIN: CRP: 9.6 mg/dL — ABNORMAL HIGH (ref <1.0)

## 2019-09-28 MED ORDER — PRAVASTATIN SODIUM 10 MG PO TABS
10.0000 mg | ORAL_TABLET | Freq: Every day | ORAL | Status: DC
  Administered 2019-09-28 – 2019-09-30 (×3): 10 mg via ORAL
  Filled 2019-09-28 (×3): qty 1

## 2019-09-28 MED ORDER — MONTELUKAST SODIUM 10 MG PO TABS
10.0000 mg | ORAL_TABLET | Freq: Every day | ORAL | Status: DC
  Administered 2019-09-28 – 2019-09-30 (×3): 10 mg via ORAL
  Filled 2019-09-28 (×3): qty 1

## 2019-09-28 MED ORDER — FOLIC ACID 1 MG PO TABS
2.0000 mg | ORAL_TABLET | Freq: Every day | ORAL | Status: DC
  Administered 2019-09-28 – 2019-10-01 (×4): 2 mg via ORAL
  Filled 2019-09-28 (×4): qty 2

## 2019-09-28 MED ORDER — INSULIN GLARGINE 100 UNIT/ML ~~LOC~~ SOLN
10.0000 [IU] | Freq: Every day | SUBCUTANEOUS | Status: DC
  Administered 2019-09-28 – 2019-09-29 (×2): 10 [IU] via SUBCUTANEOUS
  Filled 2019-09-28 (×3): qty 0.1

## 2019-09-28 MED ORDER — SALINE SPRAY 0.65 % NA SOLN
1.0000 | NASAL | Status: DC | PRN
  Filled 2019-09-28: qty 44

## 2019-09-28 MED ORDER — ASPIRIN EC 81 MG PO TBEC
81.0000 mg | DELAYED_RELEASE_TABLET | Freq: Every day | ORAL | Status: DC
  Administered 2019-09-28 – 2019-10-01 (×4): 81 mg via ORAL
  Filled 2019-09-28 (×4): qty 1

## 2019-09-28 MED ORDER — IPRATROPIUM-ALBUTEROL 20-100 MCG/ACT IN AERS
1.0000 | INHALATION_SPRAY | Freq: Four times a day (QID) | RESPIRATORY_TRACT | Status: DC
  Administered 2019-09-28 – 2019-10-01 (×9): 1 via RESPIRATORY_TRACT
  Filled 2019-09-28: qty 4

## 2019-09-28 MED ORDER — DEXAMETHASONE SODIUM PHOSPHATE 10 MG/ML IJ SOLN
10.0000 mg | INTRAMUSCULAR | Status: DC
  Administered 2019-09-28: 10 mg via INTRAVENOUS
  Filled 2019-09-28: qty 1

## 2019-09-28 MED ORDER — LEVOCETIRIZINE DIHYDROCHLORIDE 5 MG PO TABS: 5.0000 mg | ORAL_TABLET | Freq: Every evening | ORAL | Status: DC

## 2019-09-28 MED ORDER — METHYLPREDNISOLONE SODIUM SUCC 40 MG IJ SOLR
40.0000 mg | Freq: Three times a day (TID) | INTRAMUSCULAR | Status: DC
  Administered 2019-09-28 – 2019-09-29 (×3): 40 mg via INTRAVENOUS
  Filled 2019-09-28 (×3): qty 1

## 2019-09-28 MED ORDER — FLUTICASONE PROPIONATE 50 MCG/ACT NA SUSP
1.0000 | Freq: Every day | NASAL | Status: DC | PRN
  Filled 2019-09-28: qty 16

## 2019-09-28 NOTE — Progress Notes (Signed)
Patient walked one time around the unit with one minor break and no oxygen. Patient's O2 saturation maintained above 86% the entire time. Patient complained of a small amount of shortness of breath one time so we stopped moving for a minute and the patient was able to recuperate and continue walking.

## 2019-09-28 NOTE — Progress Notes (Signed)
Patient did not want me to call family, she said she would update them herself.

## 2019-09-28 NOTE — Progress Notes (Signed)
PROGRESS NOTE                                                                                                                                                                                                             Patient Demographics:    Misty Briggs, is a 68 y.o. female, DOB - 09-12-1951, RSW:[id number]  Admit date - 09/26/2019   Admitting Physician Briscoe Deutscher, MD  Outpatient Primary MD for the patient is Jerl Mina, MD  LOS - 2   No chief complaint on file.      Brief Narrative    Misty Briggs is a 68 y.o. female with medical history significant of asthma, htn, RA comes in with over a week of sob and abd pain.  No vomiting or diarrhea.  Has been coughing a lot. Tested positive for covid.  sats nml on RA but with bilatearl infiltrates on cxr.  Referred for admission for covid pna.   Subjective:    Misty Briggs today reports cough, dyspnea, mainly exertional, and generalized weakness .   Assessment  & Plan :    Principal Problem:   Pneumonia due to COVID-19 virus Active Problems:   Asthma without status asthmaticus   Essential (primary) hypertension   Rheumatoid arthritis involving multiple joints (HCC)   Acute hypoxic respiratory failure due to pneumonia due to COVID-19 infection. -With hypoxia, she is requiring 2 L nasal cannula today, I think COPD/asthma exacerbation is contributing to it as well -Continue with IV steroids will change Decadron to IV Solu-Medrol -Continue with IV remdesivir -Continue with vitamin C and zinc -She was encouraged to use incentive spirometry and flutter valve -Continue to trend inflammatory markers closely,  COVID-19 Labs  Recent Labs    09/26/19 0440 09/27/19 0555 09/28/19 0200  DDIMER  --  0.56* 0.45  FERRITIN 253  --   --   CRP 14.2* 17.1* 9.6*    Lab Results  Component Value Date   SARSCOV2NAA POSITIVE (A) 09/25/2019     Essential (primary) hypertension -Pressure remains  soft, continue to hold home medications   Rheumatoid arthritis involving multiple joints (HCC) -Continue to hold methotrexate during hospital stay  Diabetes mellitus -Home medications including Metformin and continue with insulin sliding scale during hospital stay  COPD/asthma exacerbation -Significant wheezing today, will increase her steroids to Solu-Medrol 40 mg  IV 3 times daily, will add Combivent, she was on prolonged prednisone taper by Dr. Meredeth IdeFleming her primary pulmonary physician  CKD stage III -Seen at baseline, continue to monitor  Code Status : Full  Family Communication  : D/W patient  Disposition Plan  : Home  Consults  :  None  Procedures  : None  DVT Prophylaxis  :  [place] lovenox  Lab Results  Component Value Date   PLT 252 09/28/2019    Antibiotics  :   Anti-infectives (From admission, onward)   Start     Dose/Rate Route Frequency Ordered Stop   09/27/19 1000  remdesivir 100 mg in sodium chloride 0.9 % 250 mL IVPB     100 mg 500 mL/hr over 30 Minutes Intravenous Every 24 hours 09/26/19 1944 10/01/19 0959        Objective:   Vitals:   09/28/19 0435 09/28/19 0436 09/28/19 0622 09/28/19 0737  BP: 114/68   (!) 106/57  Pulse: 81   72  Resp: 18   20  Temp:  98.5 F (36.9 C)  98.2 F (36.8 C)  TempSrc:  Oral  Oral  SpO2: 98%  94% (!) 82%    Wt Readings from Last 3 Encounters:  09/25/19 74.4 kg  11/19/18 74.3 kg  09/08/17 72.6 kg     Intake/Output Summary (Last 24 hours) at 09/28/2019 1225 Last data filed at 09/28/2019 0920 Gross per 24 hour  Intake 240 ml  Output -  Net 240 ml     Physical Exam  Awake Alert, Oriented X 3, No new F.N deficits, Normal affect Symmetrical Chest wall movement, Good air movement bilaterally, she has significant wheezing today bilaterally RRR,No Gallops,Rubs or new Murmurs, No Parasternal Heave +ve B.Sounds, Abd Soft, No tenderness, No rebound - guarding or rigidity. No Cyanosis, Clubbing or edema, No new Rash  or bruise      Data Review:    CBC Recent Labs  Lab 09/25/19 1520 09/26/19 0440 09/27/19 0555 09/28/19 0200  WBC 7.7 5.0 4.8 4.2  HGB 11.8* 11.2* 11.2* 10.9*  HCT 36.3 34.5* 35.1* 34.5*  PLT 212 201 224 252  MCV 93.8 93.8 94.6 95.8  MCH 30.5 30.4 30.2 30.3  MCHC 32.5 32.5 31.9 31.6  RDW 14.0 14.0 13.9 13.4  LYMPHSABS 0.5*  --  0.6* 0.6*  MONOABS 0.4  --  0.4 0.3  EOSABS 0.0  --  0.0 0.0  BASOSABS 0.0  --  0.0 0.0    Chemistries  Recent Labs  Lab 09/25/19 1520 09/26/19 0440 09/27/19 0555 09/28/19 0200  NA 136 138 137 137  K 4.1 3.7 3.7 4.4  CL 93* 97* 96* 96*  CO2 28 28 28 28   GLUCOSE 137* 118* 117* 156*  BUN 22 22 24* 39*  CREATININE 1.45* 1.26* 1.31* 1.36*  CALCIUM 8.7* 8.0* 8.6* 8.7*  MG  --  1.8  --   --   AST 34  --  32 27  ALT 36  --  30 29  ALKPHOS 103  --  92 86  BILITOT 0.6  --  0.9 0.9   ------------------------------------------------------------------------------------------------------------------ No results for input(s): CHOL, HDL, LDLCALC, TRIG, CHOLHDL, LDLDIRECT in the last 72 hours.  Lab Results  Component Value Date   HGBA1C 7.4 (H) 09/28/2019   ------------------------------------------------------------------------------------------------------------------ No results for input(s): TSH, T4TOTAL, T3FREE, THYROIDAB in the last 72 hours.  Invalid input(s): FREET3 ------------------------------------------------------------------------------------------------------------------ Recent Labs    09/26/19 0440  FERRITIN 253    Coagulation profile Recent  Labs  Lab 09/26/19 0440  INR 1.0    Recent Labs    09/27/19 0555 09/28/19 0200  DDIMER 0.56* 0.45    Cardiac Enzymes No results for input(s): CKMB, TROPONINI, MYOGLOBIN in the last 168 hours.  Invalid input(s): CK ------------------------------------------------------------------------------------------------------------------ No results found for: BNP  Inpatient  Medications  Scheduled Meds: . aspirin EC  81 mg Oral Daily  . dexamethasone (DECADRON) injection  10 mg Intravenous Q24H  . enoxaparin (LOVENOX) injection  40 mg Subcutaneous Q24H  . folic acid  2 mg Oral Daily  . insulin aspart  0-5 Units Subcutaneous QHS  . insulin aspart  0-9 Units Subcutaneous TID WC  . montelukast  10 mg Oral QHS  . pantoprazole  40 mg Oral Daily  . pravastatin  10 mg Oral q1800  . sodium chloride flush  3 mL Intravenous Q12H  . vitamin C  500 mg Oral Daily  . zinc sulfate  220 mg Oral Daily   Continuous Infusions: . sodium chloride    . remdesivir 100 mg in NS 250 mL 100 mg (09/28/19 0939)   PRN Meds:.sodium chloride, acetaminophen, albuterol, chlorpheniramine-HYDROcodone, fluticasone, guaiFENesin-dextromethorphan, ondansetron **OR** ondansetron (ZOFRAN) IV, sodium chloride, sodium chloride flush  Micro Results Recent Results (from the past 240 hour(s))  SARS CORONAVIRUS 2 (TAT 6-24 HRS) Nasopharyngeal Nasopharyngeal Swab     Status: Abnormal   Collection Time: 09/25/19  7:31 PM   Specimen: Nasopharyngeal Swab  Result Value Ref Range Status   SARS Coronavirus 2 POSITIVE (A) NEGATIVE Final    Comment: RESULT CALLED TO, READ BACK BY AND VERIFIED WITH: R DAVID,RN [id number] 09/26/2019 D BRADLEY (NOTE) SARS-CoV-2 target nucleic acids are DETECTED. The SARS-CoV-2 RNA is generally detectable in upper and lower respiratory specimens during the acute phase of infection. Positive results are indicative of active infection with SARS-CoV-2. Clinical  correlation with patient history and other diagnostic information is necessary to determine patient infection status. Positive results do  not rule out bacterial infection or co-infection with other viruses. The expected result is Negative. Fact Sheet for Patients: SugarRoll.be Fact Sheet for Healthcare Providers: https://www.woods-mathews.com/ This test is not yet approved or  cleared by the [place] FDA and  has been authorized for detection and/or diagnosis of SARS-CoV-2 by FDA under an Emergency Use Authorization (EUA). This EUA will remain  in effect (meaning this test can be used) for t he duration of the COVID-19 declaration under Section 564(b)(1) of the Act, 21 U.S.C. section 360bbb-3(b)(1), unless the authorization is terminated or revoked sooner. Performed at [facility], [place] [address], [place], [place] [address]   Culture, sputum-assessment     Status: None   Collection Time: 09/26/19  8:14 AM   Specimen: Expectorated Sputum  Result Value Ref Range Status   Specimen Description EXPECTORATED SPUTUM  Final   Special Requests NONE  Final   Sputum evaluation   Final    THIS SPECIMEN IS ACCEPTABLE FOR SPUTUM CULTURE Performed at [facility], [address], [place], [place] [address]    Report Status 09/26/2019 FINAL  Final  Culture, respiratory     Status: None (Preliminary result)   Collection Time: 09/26/19  8:14 AM  Result Value Ref Range Status   Specimen Description   Final    EXPECTORATED SPUTUM Performed at [facility], [address] [place],  [address]    Special Requests   Final    NONE Reflexed from [id number] Performed at [facility], [address] [place], [place] [address]    Gram Stain   Final    RARE WBC PRESENT, PREDOMINANTLY PMN NO ORGANISMS SEEN    Culture   Final    CULTURE REINCUBATED FOR BETTER GROWTH Performed at [facility], [address], [place], [place] [address]    Report Status PENDING  Incomplete    Radiology Reports Dg Chest 2 View  Result Date: 09/25/2019 CLINICAL DATA:  Shortness of breath, nonproductive cough, recently treated with antibiotics for pneumonia, onset of fever today; history asthma, COPD, diabetes mellitus, hypertension EXAM: CHEST - 2 VIEW COMPARISON:  11/17/2018 FINDINGS: Normal heart size, mediastinal contours, and pulmonary  vascularity. Interstitial infiltrate at the mid to lower LEFT lung, minimally on RIGHT. Remaining lungs clear. No pleural effusion or pneumothorax. 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 IMPRESSION: Interstitial infiltrates at the mid to lower LEFT lung, minimally on RIGHT. Electronically Signed   By: Ulyses Southward M.D.   On: 09/25/2019 16:04     Misty Briggs M.D on 09/28/2019 at 12:25 PM  Between 7am to 7pm - Pager - [phone]  After 7pm go to [email or web address] - password [facility]  [facility] -  Office  [phone]

## 2019-09-29 LAB — C-REACTIVE PROTEIN: CRP: 5.5 mg/dL — ABNORMAL HIGH (ref <1.0)

## 2019-09-29 LAB — CBC WITH DIFFERENTIAL/PLATELET
Abs Immature Granulocytes: 0.01 10*3/uL (ref 0.00–0.07)
Basophils Absolute: 0 10*3/uL (ref 0.0–0.1)
Basophils Relative: 0 %
Eosinophils Absolute: 0 10*3/uL (ref 0.0–0.5)
Eosinophils Relative: 0 %
HCT: 33.3 % — ABNORMAL LOW (ref 36.0–46.0)
Hemoglobin: 10.5 g/dL — ABNORMAL LOW (ref 12.0–15.0)
Immature Granulocytes: 0 %
Lymphocytes Relative: 11 %
Lymphs Abs: 0.4 10*3/uL — ABNORMAL LOW (ref 0.7–4.0)
MCH: 30 pg (ref 26.0–34.0)
MCHC: 31.5 g/dL (ref 30.0–36.0)
MCV: 95.1 fL (ref 80.0–100.0)
Monocytes Absolute: 0.2 10*3/uL (ref 0.1–1.0)
Monocytes Relative: 4 %
Neutro Abs: 3.3 10*3/uL (ref 1.7–7.7)
Neutrophils Relative %: 85 %
Platelets: 302 10*3/uL (ref 150–400)
RBC: 3.5 MIL/uL — ABNORMAL LOW (ref 3.87–5.11)
RDW: 13.2 % (ref 11.5–15.5)
WBC: 3.9 10*3/uL — ABNORMAL LOW (ref 4.0–10.5)
nRBC: 0 % (ref 0.0–0.2)

## 2019-09-29 LAB — GLUCOSE, CAPILLARY
Glucose-Capillary: 187 mg/dL — ABNORMAL HIGH (ref 70–99)
Glucose-Capillary: 225 mg/dL — ABNORMAL HIGH (ref 70–99)
Glucose-Capillary: 280 mg/dL — ABNORMAL HIGH (ref 70–99)

## 2019-09-29 LAB — COMPREHENSIVE METABOLIC PANEL
ALT: 31 U/L (ref 0–44)
AST: 28 U/L (ref 15–41)
Albumin: 2.9 g/dL — ABNORMAL LOW (ref 3.5–5.0)
Alkaline Phosphatase: 83 U/L (ref 38–126)
Anion gap: 11 (ref 5–15)
BUN: 42 mg/dL — ABNORMAL HIGH (ref 8–23)
CO2: 27 mmol/L (ref 22–32)
Calcium: 8.7 mg/dL — ABNORMAL LOW (ref 8.9–10.3)
Chloride: 99 mmol/L (ref 98–111)
Creatinine, Ser: 1.21 mg/dL — ABNORMAL HIGH (ref 0.44–1.00)
GFR calc Af Amer: 54 mL/min — ABNORMAL LOW (ref >60)
GFR calc non Af Amer: 46 mL/min — ABNORMAL LOW (ref >60)
Glucose, Bld: 183 mg/dL — ABNORMAL HIGH (ref 70–99)
Potassium: 5 mmol/L (ref 3.5–5.1)
Sodium: 137 mmol/L (ref 135–145)
Total Bilirubin: 0.4 mg/dL (ref 0.3–1.2)
Total Protein: 6.2 g/dL — ABNORMAL LOW (ref 6.5–8.1)

## 2019-09-29 LAB — D-DIMER, QUANTITATIVE: D-Dimer, Quant: 0.33 ug/mL-FEU (ref 0.00–0.50)

## 2019-09-29 MED ORDER — DOCUSATE SODIUM 100 MG PO CAPS
200.0000 mg | ORAL_CAPSULE | Freq: Two times a day (BID) | ORAL | Status: AC
  Administered 2019-09-29 – 2019-09-30 (×3): 200 mg via ORAL
  Filled 2019-09-29 (×3): qty 2

## 2019-09-29 MED ORDER — MAGIC MOUTHWASH W/LIDOCAINE
10.0000 mL | Freq: Four times a day (QID) | ORAL | Status: DC
  Administered 2019-09-29 – 2019-10-01 (×9): 10 mL via ORAL
  Filled 2019-09-29 (×15): qty 10

## 2019-09-29 MED ORDER — METHYLPREDNISOLONE SODIUM SUCC 40 MG IJ SOLR
40.0000 mg | Freq: Two times a day (BID) | INTRAMUSCULAR | Status: DC
  Administered 2019-09-29 – 2019-10-01 (×4): 40 mg via INTRAVENOUS
  Filled 2019-09-29 (×4): qty 1

## 2019-09-29 NOTE — Progress Notes (Signed)
PROGRESS NOTE                                                                                                                                                                                                             Patient Demographics:    Misty Briggs, is a 68 y.o. female, DOB - [birth year], FOY:[id number]  Admit date - 09/26/2019   Admitting Physician Briscoe Deutscher, MD  Outpatient Primary MD for the patient is Jerl Mina, MD  LOS - 3   No chief complaint on file.      Brief Narrative    Misty Briggs is a 68 y.o. female with medical history significant of asthma, htn, RA comes in with over a week of sob and abd pain.  No vomiting or diarrhea.  Has been coughing a lot. Tested positive for covid.  sats nml on RA but with bilatearl infiltrates on cxr.  Referred for admission for covid pna.   Subjective:    Misty Briggs today reports cough, dyspnea, mainly exertional, and generalized weakness .   Assessment  & Plan :    Principal Problem:   Pneumonia due to COVID-19 virus Active Problems:   Asthma without status asthmaticus   Essential (primary) hypertension   Rheumatoid arthritis involving multiple joints (HCC)   Acute hypoxic respiratory failure due to pneumonia due to COVID-19 infection. -With hypoxia, she was on 2 L nasal cannula yesterday, morning she is on room air, I think COPD/asthma exacerbation is contributing to it as well -Continue with IV steroids, she is currently on IV Solu-Medrol -Continue with IV remdesivir -Continue with vitamin C and zinc -She was encouraged to use incentive spirometry and flutter valve -Continue to trend inflammatory markers closely, D-dimer has normalized, CRP is trending down which is reassuring  COVID-19 Labs  Recent Labs    09/27/19 0555 09/28/19 0200 09/29/19 0012  DDIMER 0.56* 0.45 0.33  CRP 17.1* 9.6* 5.5*    Lab Results  Component Value Date   SARSCOV2NAA POSITIVE (A) 09/25/2019      Essential (primary) hypertension -Pressure remains soft, continue to hold home medications   Rheumatoid arthritis involving multiple joints (HCC) -Continue to hold methotrexate during hospital stay  Diabetes mellitus -Home medications including Metformin and continue with insulin sliding scale during hospital stay  COPD/asthma exacerbation -Patient with significant wheezing, started on  IV steroids, improving, will decrease Solu-Medrol to 40 mg IV twice daily ,,started Combivent, she was on prolonged prednisone taper by Dr. Raul Del her primary pulmonary physician  CKD stage III -Seen at baseline, continue to monitor  Code Status : Full  Family Communication  : D/W patient  Disposition Plan  : Home  Consults  :  None  Procedures  : None  DVT Prophylaxis  :  [place] lovenox  Lab Results  Component Value Date   PLT 302 09/29/2019    Antibiotics  :   Anti-infectives (From admission, onward)   Start     Dose/Rate Route Frequency Ordered Stop   09/27/19 1000  remdesivir 100 mg in sodium chloride 0.9 % 250 mL IVPB     100 mg 500 mL/hr over 30 Minutes Intravenous Every 24 hours 09/26/19 1944 10/01/19 0959        Objective:   Vitals:   09/28/19 1647 09/28/19 1933 09/29/19 0417 09/29/19 0757  BP: (!) 129/59 123/67 127/90 (!) 124/55  Pulse: 77 81 80 78  Resp: 20 (!) 22 20 16   Temp: 98 F (36.7 C) (!) 97.4 F (36.3 C) 97.8 F (36.6 C) 98.3 F (36.8 C)  TempSrc: Oral Oral Oral Oral  SpO2: 90% 90% 91% (!) 88%  Weight: 74.4 kg     Height: 5\' 6"  (1.676 m)       Wt Readings from Last 3 Encounters:  09/28/19 74.4 kg  09/25/19 74.4 kg  11/19/18 74.3 kg     Intake/Output Summary (Last 24 hours) at 09/29/2019 1334 Last data filed at 09/28/2019 1431 Gross per 24 hour  Intake 600 ml  Output -  Net 600 ml     Physical Exam  Awake Alert, Oriented X 3, No new F.N deficits, Normal affect Symmetrical Chest wall movement, Good air movement bilaterally, with some  wheezing, but has improved RRR,No Gallops,Rubs or new Murmurs, No Parasternal Heave +ve B.Sounds, Abd Soft, No tenderness, No rebound - guarding or rigidity. No Cyanosis, Clubbing or edema, No new Rash or bruise      Data Review:    CBC Recent Labs  Lab 09/25/19 1520 09/26/19 0440 09/27/19 0555 09/28/19 0200 09/29/19 0012  WBC 7.7 5.0 4.8 4.2 3.9*  HGB 11.8* 11.2* 11.2* 10.9* 10.5*  HCT 36.3 34.5* 35.1* 34.5* 33.3*  PLT 212 201 224 252 302  MCV 93.8 93.8 94.6 95.8 95.1  MCH 30.5 30.4 30.2 30.3 30.0  MCHC 32.5 32.5 31.9 31.6 31.5  RDW 14.0 14.0 13.9 13.4 13.2  LYMPHSABS 0.5*  --  0.6* 0.6* 0.4*  MONOABS 0.4  --  0.4 0.3 0.2  EOSABS 0.0  --  0.0 0.0 0.0  BASOSABS 0.0  --  0.0 0.0 0.0    Chemistries  Recent Labs  Lab 09/25/19 1520 09/26/19 0440 09/27/19 0555 09/28/19 0200 09/29/19 0012  NA 136 138 137 137 137  K 4.1 3.7 3.7 4.4 5.0  CL 93* 97* 96* 96* 99  CO2 28 28 28 28 27   GLUCOSE 137* 118* 117* 156* 183*  BUN 22 22 24* 39* 42*  CREATININE 1.45* 1.26* 1.31* 1.36* 1.21*  CALCIUM 8.7* 8.0* 8.6* 8.7* 8.7*  MG  --  1.8  --   --   --   AST 34  --  32 27 28  ALT 36  --  30 29 31   ALKPHOS 103  --  92 86 83  BILITOT 0.6  --  0.9 0.9 0.4   ------------------------------------------------------------------------------------------------------------------ No results  for input(s): CHOL, HDL, LDLCALC, TRIG, CHOLHDL, LDLDIRECT in the last 72 hours.  Lab Results  Component Value Date   HGBA1C 7.4 (H) 09/28/2019   ------------------------------------------------------------------------------------------------------------------ No results for input(s): TSH, T4TOTAL, T3FREE, THYROIDAB in the last 72 hours.  Invalid input(s): FREET3 ------------------------------------------------------------------------------------------------------------------ No results for input(s): VITAMINB12, FOLATE, FERRITIN, TIBC, IRON, RETICCTPCT in the last 72 hours.  Coagulation profile  Recent Labs  Lab 09/26/19 0440  INR 1.0    Recent Labs    09/28/19 0200 09/29/19 0012  DDIMER 0.45 0.33    Cardiac Enzymes No results for input(s): CKMB, TROPONINI, MYOGLOBIN in the last 168 hours.  Invalid input(s): CK ------------------------------------------------------------------------------------------------------------------ No results found for: BNP  Inpatient Medications  Scheduled Meds: . aspirin EC  81 mg Oral Daily  . docusate sodium  200 mg Oral BID  . enoxaparin (LOVENOX) injection  40 mg Subcutaneous Q24H  . folic acid  2 mg Oral Daily  . insulin aspart  0-5 Units Subcutaneous QHS  . insulin aspart  0-9 Units Subcutaneous TID WC  . insulin glargine  10 Units Subcutaneous Daily  . Ipratropium-Albuterol  1 puff Inhalation Q6H  . magic mouthwash w/lidocaine  10 mL Oral QID  . methylPREDNISolone (SOLU-MEDROL) injection  40 mg Intravenous Q8H  . montelukast  10 mg Oral QHS  . pantoprazole  40 mg Oral Daily  . pravastatin  10 mg Oral q1800  . sodium chloride flush  3 mL Intravenous Q12H  . vitamin C  500 mg Oral Daily  . zinc sulfate  220 mg Oral Daily   Continuous Infusions: . sodium chloride    . remdesivir 100 mg in NS 250 mL 100 mg (09/29/19 0941)   PRN Meds:.sodium chloride, acetaminophen, albuterol, chlorpheniramine-HYDROcodone, fluticasone, guaiFENesin-dextromethorphan, ondansetron **OR** ondansetron (ZOFRAN) IV, sodium chloride, sodium chloride flush  Micro Results Recent Results (from the past 240 hour(s))  SARS CORONAVIRUS 2 (TAT 6-24 HRS) Nasopharyngeal Nasopharyngeal Swab     Status: Abnormal   Collection Time: 09/25/19  7:31 PM   Specimen: Nasopharyngeal Swab  Result Value Ref Range Status   SARS Coronavirus 2 POSITIVE (A) NEGATIVE Final    Comment: RESULT CALLED TO, READ BACK BY AND VERIFIED WITH: R DAVID,RN [id number] 09/26/2019 D BRADLEY (NOTE) SARS-CoV-2 target nucleic acids are DETECTED. The SARS-CoV-2 RNA is generally detectable in upper  and lower respiratory specimens during the acute phase of infection. Positive results are indicative of active infection with SARS-CoV-2. Clinical  correlation with patient history and other diagnostic information is necessary to determine patient infection status. Positive results do  not rule out bacterial infection or co-infection with other viruses. The expected result is Negative. Fact Sheet for Patients: HairSlick.nohttps://www.fda.gov/media/138098/download Fact Sheet for Healthcare Providers: quierodirigir.comhttps://www.fda.gov/media/138095/download This test is not yet approved or cleared by the [place] FDA and  has been authorized for detection and/or diagnosis of SARS-CoV-2 by FDA under an Emergency Use Authorization (EUA). This EUA will remain  in effect (meaning this test can be used) for t he duration of the COVID-19 declaration under Section 564(b)(1) of the Act, 21 U.S.C. section 360bbb-3(b)(1), unless the authorization is terminated or revoked sooner. Performed at [facility], [address] [place], [place] [address]   Culture, sputum-assessment     Status: None   Collection Time: 09/26/19  8:14 AM   Specimen: Expectorated Sputum  Result Value Ref Range Status   Specimen Description EXPECTORATED SPUTUM  Final   Special Requests NONE  Final   Sputum evaluation   Final  THIS SPECIMEN IS ACCEPTABLE FOR SPUTUM CULTURE Performed at [facility], [address] [place], [place] [address]    Report Status 09/26/2019 FINAL  Final  Culture, respiratory     Status: None (Preliminary result)   Collection Time: 09/26/19  8:14 AM  Result Value Ref Range Status   Specimen Description   Final    EXPECTORATED SPUTUM Performed at [facility], [address] [place], [place] [address]    Special Requests   Final    NONE Reflexed from [id number] Performed at [facility], [address] [place], [place] [address]    Gram Stain   Final    RARE WBC PRESENT,  PREDOMINANTLY PMN NO ORGANISMS SEEN    Culture   Final    CULTURE REINCUBATED FOR BETTER GROWTH Performed at [facility], [address] [place], [place] [address]    Report Status PENDING  Incomplete    Radiology Reports Dg Chest 2 View  Result Date: 09/25/2019 CLINICAL DATA:  Shortness of breath, nonproductive cough, recently treated with antibiotics for pneumonia, onset of fever today; history asthma, COPD, diabetes mellitus, hypertension EXAM: CHEST - 2 VIEW COMPARISON:  11/17/2018 FINDINGS: Normal heart size, mediastinal contours, and pulmonary vascularity. Interstitial infiltrate at the mid to lower LEFT lung, minimally on RIGHT. Remaining lungs clear. No pleural effusion or pneumothorax. 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 IMPRESSION: Interstitial infiltrates at the mid to lower LEFT lung, minimally on RIGHT. Electronically Signed   By: Ulyses SouthwardMark  Boles M.D.   On: 09/25/2019 16:04     Huey Bienenstockawood Ilean Spradlin M.D on 09/29/2019 at 1:34 PM  Between 7am to 7pm - Pager - [phone]  After 7pm go to [email or web address] - password [email or web address]  [facility] -  Office  [phone]

## 2019-09-29 NOTE — Progress Notes (Signed)
Patient walked one time around the unit with no oxygen. Patient took one minor break due to feeling short of breath. Patient's O2 saturation ranged between 87% and 92% for the duration of her walk.

## 2019-09-30 LAB — GLUCOSE, CAPILLARY
Glucose-Capillary: 328 mg/dL — ABNORMAL HIGH (ref 70–99)
Glucose-Capillary: 297 mg/dL — ABNORMAL HIGH (ref 70–99)
Glucose-Capillary: 227 mg/dL — ABNORMAL HIGH (ref 70–99)

## 2019-09-30 LAB — COMPREHENSIVE METABOLIC PANEL
ALT: 29 U/L (ref 0–44)
AST: 24 U/L (ref 15–41)
Albumin: 2.8 g/dL — ABNORMAL LOW (ref 3.5–5.0)
Alkaline Phosphatase: 76 U/L (ref 38–126)
Anion gap: 10 (ref 5–15)
BUN: 46 mg/dL — ABNORMAL HIGH (ref 8–23)
CO2: 24 mmol/L (ref 22–32)
Calcium: 8.6 mg/dL — ABNORMAL LOW (ref 8.9–10.3)
Chloride: 101 mmol/L (ref 98–111)
Creatinine, Ser: 1.23 mg/dL — ABNORMAL HIGH (ref 0.44–1.00)
GFR calc Af Amer: 53 mL/min — ABNORMAL LOW (ref >60)
GFR calc non Af Amer: 45 mL/min — ABNORMAL LOW (ref >60)
Glucose, Bld: 208 mg/dL — ABNORMAL HIGH (ref 70–99)
Potassium: 5 mmol/L (ref 3.5–5.1)
Sodium: 135 mmol/L (ref 135–145)
Total Bilirubin: 0.4 mg/dL (ref 0.3–1.2)
Total Protein: 5.6 g/dL — ABNORMAL LOW (ref 6.5–8.1)

## 2019-09-30 LAB — CBC WITH DIFFERENTIAL/PLATELET
Abs Immature Granulocytes: 0.03 10*3/uL (ref 0.00–0.07)
Basophils Absolute: 0 10*3/uL (ref 0.0–0.1)
Basophils Relative: 0 %
Eosinophils Absolute: 0 10*3/uL (ref 0.0–0.5)
Eosinophils Relative: 0 %
HCT: 33.6 % — ABNORMAL LOW (ref 36.0–46.0)
Hemoglobin: 10.9 g/dL — ABNORMAL LOW (ref 12.0–15.0)
Immature Granulocytes: 1 %
Lymphocytes Relative: 6 %
Lymphs Abs: 0.4 10*3/uL — ABNORMAL LOW (ref 0.7–4.0)
MCH: 30.5 pg (ref 26.0–34.0)
MCHC: 32.4 g/dL (ref 30.0–36.0)
MCV: 94.1 fL (ref 80.0–100.0)
Monocytes Absolute: 0.3 10*3/uL (ref 0.1–1.0)
Monocytes Relative: 4 %
Neutro Abs: 5.9 10*3/uL (ref 1.7–7.7)
Neutrophils Relative %: 89 %
Platelets: 324 10*3/uL (ref 150–400)
RBC: 3.57 MIL/uL — ABNORMAL LOW (ref 3.87–5.11)
RDW: 13 % (ref 11.5–15.5)
WBC: 6.6 10*3/uL (ref 4.0–10.5)
nRBC: 0 % (ref 0.0–0.2)

## 2019-09-30 LAB — CULTURE, RESPIRATORY W GRAM STAIN: Culture: NORMAL

## 2019-09-30 LAB — D-DIMER, QUANTITATIVE: D-Dimer, Quant: 0.43 ug/mL-FEU (ref 0.00–0.50)

## 2019-09-30 LAB — C-REACTIVE PROTEIN: CRP: 2.4 mg/dL — ABNORMAL HIGH (ref <1.0)

## 2019-09-30 MED ORDER — SODIUM CHLORIDE 0.9 % IV SOLN
INTRAVENOUS | Status: AC
  Administered 2019-09-30: 10:00:00 via INTRAVENOUS

## 2019-09-30 MED ORDER — INSULIN GLARGINE 100 UNIT/ML ~~LOC~~ SOLN
14.0000 [IU] | Freq: Every day | SUBCUTANEOUS | Status: DC
  Administered 2019-09-30 – 2019-10-01 (×2): 14 [IU] via SUBCUTANEOUS
  Filled 2019-09-30 (×2): qty 0.14

## 2019-09-30 NOTE — Progress Notes (Signed)
PROGRESS NOTE                                                                                                                                                                                                             Patient Demographics:    Misty Briggs, is a 68 y.o. female, DOB - 12/10/1950, ZJQ:[id number]  Admit date - 09/26/2019   Admitting Physician Briscoe Deutscher, MD  Outpatient Primary MD for the patient is Jerl Mina, MD  LOS - 4   No chief complaint on file.      Brief Narrative    Misty Briggs is a 68 y.o. female with medical history significant of asthma, htn, RA comes in with over a week of sob and abd pain.  No vomiting or diarrhea.  Has been coughing a lot. Tested positive for covid.  sats nml on RA but with bilatearl infiltrates on cxr.  Referred for admission for covid pna.   Subjective:    Misty Briggs today reports dyspnea, usually worse in the morning, and exertional, reports her generalized weakness has improved .  Still feels much weaker than her baseline,.   Assessment  & Plan :    Principal Problem:   Pneumonia due to COVID-19 virus Active Problems:   Asthma without status asthmaticus   Essential (primary) hypertension   Rheumatoid arthritis involving multiple joints (HCC)   Acute hypoxic respiratory failure due to pneumonia due to COVID-19 infection. -With hypoxia, she was on 2 L nasal cannula yesterday, is on room air at rest, but on exertion saturation drops to 87%, I think COPD/asthma exacerbation is contributing to it as well -Continue with IV steroids, she is currently on IV Solu-Medrol -Continue with IV remdesivir -Continue with vitamin C and zinc -She was encouraged to use incentive spirometry and flutter valve -Continue to trend inflammatory markers closely, D-dimer has normalized, CRP is trending down which is reassuring  COVID-19 Labs  Recent Labs    09/28/19 0200 09/29/19 0012 09/30/19 0329   DDIMER 0.45 0.33 0.43  CRP 9.6* 5.5* 2.4*    Lab Results  Component Value Date   SARSCOV2NAA POSITIVE (A) 09/25/2019     Essential (primary) hypertension -Blood pressure remains on hold, blood pressure remains soft, she appears to be dry, dehydrated at reports dizziness and systolic blood pressure in the 90s, will  give 500 cc fluid challenge.   Rheumatoid arthritis involving multiple joints (HCC) -Continue to hold methotrexate during hospital stay  Diabetes mellitus -Home medications including Metformin and continue with insulin sliding scale during hospital stay, Lantus remains elevated will increase to 14 units subcu daily  COPD/asthma exacerbation -Patient with significant wheezing, started on IV steroids, improving, will decrease Solu-Medrol to 40 mg IV twice daily ,,started Combivent, she was on prolonged prednisone taper by Dr. Raul Del her primary pulmonary physician  CKD stage III -Seen at baseline, continue to monitor  Code Status : Full  Family Communication  : D/W patient  Disposition Plan  : Home  Consults  :  None  Procedures  : None  DVT Prophylaxis  :  [place] lovenox  Lab Results  Component Value Date   PLT 324 09/30/2019    Antibiotics  :   Anti-infectives (From admission, onward)   Start     Dose/Rate Route Frequency Ordered Stop   09/27/19 1000  remdesivir 100 mg in sodium chloride 0.9 % 250 mL IVPB     100 mg 500 mL/hr over 30 Minutes Intravenous Every 24 hours 09/26/19 1944 09/30/19 1044        Objective:   Vitals:   09/29/19 2025 09/30/19 0246 09/30/19 0642 09/30/19 0752  BP:   120/74 (!) 96/49  Pulse: 75 78 76 74  Resp:    20  Temp:    98.4 F (36.9 C)  TempSrc:    Oral  SpO2: 92% 91% 91% 91%  Weight:      Height:        Wt Readings from Last 3 Encounters:  09/28/19 74.4 kg  09/25/19 74.4 kg  11/19/18 74.3 kg     Intake/Output Summary (Last 24 hours) at 09/30/2019 1138 Last data filed at 09/30/2019 0919 Gross per 24 hour   Intake 940 ml  Output -  Net 940 ml     Physical Exam  Awake Alert, Oriented X 3, No new F.N deficits, Normal affect Symmetrical Chest wall movement, Good air movement bilaterally, remains with some wheezing today RRR,No Gallops,Rubs or new Murmurs, No Parasternal Heave +ve B.Sounds, Abd Soft, No tenderness, No rebound - guarding or rigidity. No Cyanosis, Clubbing or edema, No new Rash or bruise      Data Review:    CBC Recent Labs  Lab 09/25/19 1520 09/26/19 0440 09/27/19 0555 09/28/19 0200 09/29/19 0012 09/30/19 0329  WBC 7.7 5.0 4.8 4.2 3.9* 6.6  HGB 11.8* 11.2* 11.2* 10.9* 10.5* 10.9*  HCT 36.3 34.5* 35.1* 34.5* 33.3* 33.6*  PLT 212 201 224 252 302 324  MCV 93.8 93.8 94.6 95.8 95.1 94.1  MCH 30.5 30.4 30.2 30.3 30.0 30.5  MCHC 32.5 32.5 31.9 31.6 31.5 32.4  RDW 14.0 14.0 13.9 13.4 13.2 13.0  LYMPHSABS 0.5*  --  0.6* 0.6* 0.4* 0.4*  MONOABS 0.4  --  0.4 0.3 0.2 0.3  EOSABS 0.0  --  0.0 0.0 0.0 0.0  BASOSABS 0.0  --  0.0 0.0 0.0 0.0    Chemistries  Recent Labs  Lab 09/25/19 1520 09/26/19 0440 09/27/19 0555 09/28/19 0200 09/29/19 0012 09/30/19 0329  NA 136 138 137 137 137 135  K 4.1 3.7 3.7 4.4 5.0 5.0  CL 93* 97* 96* 96* 99 101  CO2 28 28 28 28 27 24   GLUCOSE 137* 118* 117* 156* 183* 208*  BUN 22 22 24* 39* 42* 46*  CREATININE 1.45* 1.26* 1.31* 1.36* 1.21* 1.23*  CALCIUM 8.7*  8.0* 8.6* 8.7* 8.7* 8.6*  MG  --  1.8  --   --   --   --   AST 34  --  32 27 28 24   ALT 36  --  30 29 31 29   ALKPHOS 103  --  92 86 83 76  BILITOT 0.6  --  0.9 0.9 0.4 0.4   ------------------------------------------------------------------------------------------------------------------ No results for input(s): CHOL, HDL, LDLCALC, TRIG, CHOLHDL, LDLDIRECT in the last 72 hours.  Lab Results  Component Value Date   HGBA1C 7.4 (H) 09/28/2019   ------------------------------------------------------------------------------------------------------------------ No results for  input(s): TSH, T4TOTAL, T3FREE, THYROIDAB in the last 72 hours.  Invalid input(s): FREET3 ------------------------------------------------------------------------------------------------------------------ No results for input(s): VITAMINB12, FOLATE, FERRITIN, TIBC, IRON, RETICCTPCT in the last 72 hours.  Coagulation profile Recent Labs  Lab 09/26/19 0440  INR 1.0    Recent Labs    09/29/19 0012 09/30/19 0329  DDIMER 0.33 0.43    Cardiac Enzymes No results for input(s): CKMB, TROPONINI, MYOGLOBIN in the last 168 hours.  Invalid input(s): CK ------------------------------------------------------------------------------------------------------------------ No results found for: BNP  Inpatient Medications  Scheduled Meds: . aspirin EC  81 mg Oral Daily  . enoxaparin (LOVENOX) injection  40 mg Subcutaneous Q24H  . folic acid  2 mg Oral Daily  . insulin aspart  0-5 Units Subcutaneous QHS  . insulin aspart  0-9 Units Subcutaneous TID WC  . insulin glargine  14 Units Subcutaneous Daily  . Ipratropium-Albuterol  1 puff Inhalation Q6H  . magic mouthwash w/lidocaine  10 mL Oral QID  . methylPREDNISolone (SOLU-MEDROL) injection  40 mg Intravenous Q12H  . montelukast  10 mg Oral QHS  . pantoprazole  40 mg Oral Daily  . pravastatin  10 mg Oral q1800  . sodium chloride flush  3 mL Intravenous Q12H  . vitamin C  500 mg Oral Daily  . zinc sulfate  220 mg Oral Daily   Continuous Infusions: . sodium chloride    . sodium chloride 50 mL/hr at 09/30/19 1013   PRN Meds:.sodium chloride, acetaminophen, albuterol, chlorpheniramine-HYDROcodone, fluticasone, guaiFENesin-dextromethorphan, ondansetron **OR** ondansetron (ZOFRAN) IV, sodium chloride, sodium chloride flush  Micro Results Recent Results (from the past 240 hour(s))  SARS CORONAVIRUS 2 (TAT 6-24 HRS) Nasopharyngeal Nasopharyngeal Swab     Status: Abnormal   Collection Time: 09/25/19  7:31 PM   Specimen: Nasopharyngeal Swab   Result Value Ref Range Status   SARS Coronavirus 2 POSITIVE (A) NEGATIVE Final    Comment: RESULT CALLED TO, READ BACK BY AND VERIFIED WITH: R DAVID,RN [id number] 09/26/2019 D BRADLEY (NOTE) SARS-CoV-2 target nucleic acids are DETECTED. The SARS-CoV-2 RNA is generally detectable in upper and lower respiratory specimens during the acute phase of infection. Positive results are indicative of active infection with SARS-CoV-2. Clinical  correlation with patient history and other diagnostic information is necessary to determine patient infection status. Positive results do  not rule out bacterial infection or co-infection with other viruses. The expected result is Negative. Fact Sheet for Patients: HairSlick.nohttps://www.fda.gov/media/138098/download Fact Sheet for Healthcare Providers: quierodirigir.comhttps://www.fda.gov/media/138095/download This test is not yet approved or cleared by the [place] FDA and  has been authorized for detection and/or diagnosis of SARS-CoV-2 by FDA under an Emergency Use Authorization (EUA). This EUA will remain  in effect (meaning this test can be used) for t he duration of the COVID-19 declaration under Section 564(b)(1) of the Act, 21 U.S.C. section 360bbb-3(b)(1), unless the authorization is terminated or revoked sooner. Performed at [facility], [address], [place], [place] [address]   Culture, sputum-assessment     Status: None   Collection Time: 09/26/19  8:14 AM   Specimen: Expectorated Sputum  Result Value Ref Range Status   Specimen Description EXPECTORATED SPUTUM  Final   Special Requests NONE  Final   Sputum evaluation   Final    THIS SPECIMEN IS ACCEPTABLE FOR SPUTUM CULTURE Performed at [facility], [address] [place], [place] [address]    Report Status 09/26/2019 FINAL  Final  Culture, respiratory     Status: None (Preliminary result)   Collection Time: 09/26/19  8:14 AM  Result Value Ref Range Status   Specimen Description   Final     EXPECTORATED SPUTUM Performed at [facility], [address], [place], [place] [address]    Special Requests   Final    NONE Reflexed from [id number] Performed at [facility], [address] [place], [place] [address]    Gram Stain   Final    RARE WBC PRESENT, PREDOMINANTLY PMN NO ORGANISMS SEEN    Culture   Final    CULTURE REINCUBATED FOR BETTER GROWTH Performed at [facility], [address], [place], [place] [address]    Report Status PENDING  Incomplete    Radiology Reports Dg Chest 2 View  Result Date: 09/25/2019 CLINICAL DATA:  Shortness of breath, nonproductive cough, recently treated with antibiotics for pneumonia, onset of fever today; history asthma, COPD, diabetes mellitus, hypertension EXAM: CHEST - 2 VIEW COMPARISON:  11/17/2018 FINDINGS: Normal heart size, mediastinal contours, and pulmonary vascularity. Interstitial infiltrate at the mid to lower LEFT lung, minimally on RIGHT. Remaining lungs clear. No pleural effusion or pneumothorax. 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 IMPRESSION: Interstitial infiltrates at the mid to lower LEFT lung, minimally on RIGHT. Electronically Signed   By: Ulyses Southward M.D.   On: 09/25/2019 16:04     Huey Bienenstock M.D on 09/30/2019 at 11:38 AM  Between 7am to 7pm - Pager - [phone]  After 7pm go to [email or web address] - password [facility]  [facility] -  Office  [phone]

## 2019-09-30 NOTE — Progress Notes (Signed)
Patient walked one time around the unit with no oxygen. Patient did not need to take a break or complain of any shortness of breath. Patient's O2 saturation ranged between 85% and 92% for the duration of her walk.

## 2019-10-01 LAB — COMPREHENSIVE METABOLIC PANEL
ALT: 28 U/L (ref 0–44)
AST: 22 U/L (ref 15–41)
Albumin: 2.9 g/dL — ABNORMAL LOW (ref 3.5–5.0)
Alkaline Phosphatase: 75 U/L (ref 38–126)
Anion gap: 7 (ref 5–15)
BUN: 48 mg/dL — ABNORMAL HIGH (ref 8–23)
CO2: 26 mmol/L (ref 22–32)
Calcium: 8.3 mg/dL — ABNORMAL LOW (ref 8.9–10.3)
Chloride: 102 mmol/L (ref 98–111)
Creatinine, Ser: 1.34 mg/dL — ABNORMAL HIGH (ref 0.44–1.00)
GFR calc Af Amer: 47 mL/min — ABNORMAL LOW (ref >60)
GFR calc non Af Amer: 41 mL/min — ABNORMAL LOW (ref >60)
Glucose, Bld: 193 mg/dL — ABNORMAL HIGH (ref 70–99)
Potassium: 5.2 mmol/L — ABNORMAL HIGH (ref 3.5–5.1)
Sodium: 135 mmol/L (ref 135–145)
Total Bilirubin: 0.9 mg/dL (ref 0.3–1.2)
Total Protein: 5.5 g/dL — ABNORMAL LOW (ref 6.5–8.1)

## 2019-10-01 LAB — CBC WITH DIFFERENTIAL/PLATELET
Abs Immature Granulocytes: 0.1 10*3/uL — ABNORMAL HIGH (ref 0.00–0.07)
Basophils Absolute: 0 10*3/uL (ref 0.0–0.1)
Basophils Relative: 0 %
Eosinophils Absolute: 0 10*3/uL (ref 0.0–0.5)
Eosinophils Relative: 0 %
HCT: 32.6 % — ABNORMAL LOW (ref 36.0–46.0)
Hemoglobin: 10.4 g/dL — ABNORMAL LOW (ref 12.0–15.0)
Immature Granulocytes: 1 %
Lymphocytes Relative: 4 %
Lymphs Abs: 0.4 10*3/uL — ABNORMAL LOW (ref 0.7–4.0)
MCH: 30.1 pg (ref 26.0–34.0)
MCHC: 31.9 g/dL (ref 30.0–36.0)
MCV: 94.5 fL (ref 80.0–100.0)
Monocytes Absolute: 0.4 10*3/uL (ref 0.1–1.0)
Monocytes Relative: 5 %
Neutro Abs: 8 10*3/uL — ABNORMAL HIGH (ref 1.7–7.7)
Neutrophils Relative %: 90 %
Platelets: 347 10*3/uL (ref 150–400)
RBC: 3.45 MIL/uL — ABNORMAL LOW (ref 3.87–5.11)
RDW: 13.2 % (ref 11.5–15.5)
WBC: 9 10*3/uL (ref 4.0–10.5)
nRBC: 0 % (ref 0.0–0.2)

## 2019-10-01 LAB — GLUCOSE, CAPILLARY
Glucose-Capillary: 346 mg/dL — ABNORMAL HIGH (ref 70–99)
Glucose-Capillary: 154 mg/dL — ABNORMAL HIGH (ref 70–99)
Glucose-Capillary: 130 mg/dL — ABNORMAL HIGH (ref 70–99)
Glucose-Capillary: 389 mg/dL — ABNORMAL HIGH (ref 70–99)

## 2019-10-01 LAB — POTASSIUM: Potassium: 4.6 mmol/L (ref 3.5–5.1)

## 2019-10-01 LAB — D-DIMER, QUANTITATIVE: D-Dimer, Quant: 0.36 ug/mL-FEU (ref 0.00–0.50)

## 2019-10-01 LAB — C-REACTIVE PROTEIN: CRP: 1.8 mg/dL — ABNORMAL HIGH (ref <1.0)

## 2019-10-01 MED ORDER — PREDNISONE 10 MG (21) PO TBPK: ORAL_TABLET | ORAL | 0 refills | Status: DC

## 2019-10-01 MED ORDER — SODIUM POLYSTYRENE SULFONATE 15 GM/60ML PO SUSP
30.0000 g | Freq: Once | ORAL | Status: AC
  Administered 2019-10-01: 30 g via ORAL
  Filled 2019-10-01: qty 120

## 2019-10-01 MED ORDER — ACETAMINOPHEN 325 MG PO TABS: 650.0000 mg | ORAL_TABLET | Freq: Four times a day (QID) | ORAL | Status: DC | PRN

## 2019-10-01 NOTE — Progress Notes (Signed)
Signed          SATURATION QUALIFICATIONS: (This note is used to comply with regulatory documentation for home oxygen)  Patient Saturations on Room Air at Rest = 97%  Patient Saturations on Room Air while Ambulating = 92%  Patient Saturations on room air while Ambulating = 92%  Pt did not require any oxygen to as her oxygen saturation fell below 92 for literally less than one second and returned to 96 without oxygen being added while ambulating.

## 2019-10-01 NOTE — Discharge Summary (Signed)
Misty MagnusDoris P Briggs, is a 68 y.o. female  DOB 12-07-1950  MRN [id number].  Admission date:  09/26/2019  Admitting Physician  Briscoe Deutscherimothy S Opyd, MD  Discharge Date:  10/01/2019   Primary MD  Jerl MinaHedrick, James, MD  Recommendations for primary care physician for things to follow:  -Check CBC, BMP during next visit -To follow with pulmonary as an outpatient regarding her COPD.  Admission Diagnosis  COVID POSITIVE PNEUMONIA   Discharge Diagnosis  COVID POSITIVE PNEUMONIA    Principal Problem:   Pneumonia due to COVID-19 virus Active Problems:   Asthma without status asthmaticus   Essential (primary) hypertension   Rheumatoid arthritis involving multiple joints (HCC)      Past Medical History:  Diagnosis Date  . Arthritis   . Asthma   . COPD (chronic obstructive pulmonary disease) (HCC)    Dr. Meredeth IdeFleming, [facility] pulmon[facility]  . Diabetes mellitus   . GERD (gastroesophageal reflux disease)   . Heart murmur   . HLD (hyperlipidemia)   . Hypertension   . Myocardial infarction (HCC)    mild  . PONV (postoperative nausea and vomiting)    itching sometimes  . Rheumatoid arthritis(714.0)   . Shortness of breath     Past Surgical History:  Procedure Laterality Date  . ANTERIOR CERVICAL DECOMP/DISCECTOMY FUSION  05/05/2012   Procedure: ANTERIOR CERVICAL DECOMPRESSION/DISCECTOMY FUSION 1 LEVEL;  Surgeon: Karn CassisErnesto M Botero, MD;  Location: [facility];  Service: Neurosurgery;  Laterality: N/A;  Cervical six seven Anterior cervical decompression/diskectomy, Fusion, Plate  . COLONOSCOPY WITH PROPOFOL N/A 09/08/2017   Procedure: COLONOSCOPY WITH PROPOFOL;  Surgeon: Christena DeemSkulskie, Martin U, MD;  [facility];  Service: Endoscopy;  Laterality: N/A;  . ENDOMETRIAL ABLATION     novasure  . FOOT SURGERY  2015  . ROTATOR CUFF REPAIR     bilat  . TRANSTHORACIC ECHOCARDIOGRAM  2010   [facility]  . TUBAL LIGATION         History of present illness and  Hospital Course:     Kindly see H&P for history of present illness and admission details, please review complete Labs, Consult reports and Test reports for all details in brief  HPI  from the history and physical done on the day of admission 09/26/2019  Misty Briggs is a 68 y.o. female with medical history significant of asthma, htn, RA comes in with over a week of sob and abd pain.  No vomiting or diarrhea.  Has been coughing a lot. Tested positive for covid.  sats nml on RA but with bilatearl infiltrates on cxr.  Referred for admission for covid pna.  Hospital Course    Acute hypoxic respiratory failure due to pneumonia due to COVID-19 infection. -With hypoxia, she was on 2 L nasal , has resolved, she is on room air, she was treated with IV Solu-Medrol during hospital stay for COVID-19 and COPD exacerbation, she will be discharged on prednisone taper, received 5 days of IV remdesivir, her inflammatory markers significantly trending  down.  COVID-19 Labs  Recent Labs (last 2 labs)        Recent Labs    09/28/19 0200 09/29/19 0012 09/30/19 0329  DDIMER 0.45 0.33 0.43  CRP 9.6* 5.5* 2.4*      Recent Labs       Lab Results  Component Value Date   SARSCOV2NAA POSITIVE (A) 09/25/2019      Essential (primary) hypertension -Pressures of during hospital stay, she was dizzy and lightheaded, her medication has been held during hospital stay, even with that blood pressure was on the lower side, she did respond to fluids, meds will be held on discharge .  Rheumatoid arthritis involving multiple joints (HCC) -Stable with no acute issues during hospital stay  Diabetes mellitus -Resume home meds on discharge  COPD/asthma exacerbation -Patient with significant wheezing initially, she was treated with IV steroids, her wheezing has resolved today, she will be discharged on prednisone taper . -She  is following with Dr. Meredeth Ide, she is on prolonged prednisone tapered, patient tells me she was tapered to 10 mg oral daily with plan to DC soon, so I have instructed her to finish her prednisone taper , and resume her prednisone 10 mg daily .  Hyperkalemia -Potassium 5.2 on discharge, she received Kayexalate repeat is 4.6  CKD stage III -Seen at baseline, continue to monitor     Discharge Condition:  Stable     Discharge Instructions  and  Discharge Medications    Discharge Instructions    Discharge instructions   Complete by: As directed    Follow with Primary MD Jerl Mina, MD in 14 days   Get CBC, CMP,  checked  by Primary MD next visit.    Activity: As tolerated with Full fall precautions use walker/cane & assistance as needed   Disposition Home    Diet: Heart Healthy /Carb modified , with feeding assistance and aspiration precautions.  For Heart failure patients - Check your Weight same time everyday, if you gain over 2 pounds, or you develop in leg swelling, experience more shortness of breath or chest pain, call your Primary MD immediately. Follow Cardiac Low Salt Diet and 1.5 lit/day fluid restriction.   On your next visit with your primary care physician please Get Medicines reviewed and adjusted.   Please request your Prim.MD to go over all Hospital Tests and Procedure/Radiological results at the follow up, please get all Hospital records sent to your Prim MD by signing hospital release before you go home.   If you experience worsening of your admission symptoms, develop shortness of breath, life threatening emergency, suicidal or homicidal thoughts you must seek medical attention immediately by calling 911 or calling your MD immediately  if symptoms less severe.  You Must read complete instructions/literature along with all the possible adverse reactions/side effects for all the Medicines you take and that have been prescribed to you. Take any new  Medicines after you have completely understood and accpet all the possible adverse reactions/side effects.   Do not drive, operating heavy machinery, perform activities at heights, swimming or participation in water activities or provide baby sitting services if your were admitted for syncope or siezures until you have seen by Primary MD or a Neurologist and advised to do so again.  Do not drive when taking Pain medications.    Do not take more than prescribed Pain, Sleep and Anxiety Medications  Special Instructions: If you have smoked or chewed Tobacco  in the last 2 yrs please stop  smoking, stop any regular Alcohol  and or any Recreational drug use.  Wear Seat belts while driving.   Please note  You were cared for by a hospitalist during your hospital stay. If you have any questions about your discharge medications or the care you received while you were in the hospital after you are discharged, you can call the unit and asked to speak with the hospitalist on call if the hospitalist that took care of you is not available. Once you are discharged, your primary care physician will handle any further medical issues. Please note that NO REFILLS for any discharge medications will be authorized once you are discharged, as it is imperative that you return to your primary care physician (or establish a relationship with a primary care physician if you do not have one) for your aftercare needs so that they can reassess your need for medications and monitor your lab values.   Increase activity slowly   Complete by: As directed      Allergies as of 10/01/2019      Reactions   Penicillins Hives, Palpitations, Rash   Azithromycin Other (See Comments)   Thrush   Levofloxacin Other (See Comments)   Joint pain/muscle pain   Sulfa Antibiotics Rash      Medication List    STOP taking these medications   furosemide 20 MG tablet Commonly known as: LASIX   hydrochlorothiazide 25 MG tablet Commonly  known as: HYDRODIURIL   Lodine 400 MG tablet Generic drug: etodolac   predniSONE 10 MG tablet Commonly known as: DELTASONE Replaced by: predniSONE 10 MG (21) Tbpk tablet   telmisartan 40 MG tablet Commonly known as: MICARDIS     TAKE these medications   acetaminophen 325 MG tablet Commonly known as: TYLENOL Take 2 tablets (650 mg total) by mouth every 6 (six) hours as needed for mild pain or headache (fever >/= 101).   albuterol 108 (90 Base) MCG/ACT inhaler Commonly known as: VENTOLIN HFA Inhale 1-2 puffs into the lungs every 6 (six) hours as needed for wheezing or shortness of breath.   aspirin EC 81 MG tablet Take 81 mg by mouth daily.   calcium carbonate 1250 (500 Ca) MG tablet Commonly known as: OS-CAL - dosed in mg of elemental calcium Take 2 tablets by mouth daily with breakfast.   ferrous sulfate 325 (65 FE) MG tablet Take 1 tablet (325 mg total) by mouth 2 (two) times daily with a meal.   fluticasone 50 MCG/ACT nasal spray Commonly known as: FLONASE Place 1 spray into both nostrils daily as needed for allergies or rhinitis.   folic acid 1 MG tablet Commonly known as: FOLVITE Take 2 mg by mouth daily.   hydrOXYzine 25 MG tablet Commonly known as: ATARAX/VISTARIL Take 50 mg by mouth every 8 (eight) hours as needed.   levocetirizine 5 MG tablet Commonly known as: XYZAL Take 5 mg by mouth every evening.   lovastatin 20 MG tablet Commonly known as: MEVACOR Take 20 mg by mouth at bedtime.   magnesium gluconate 500 MG tablet Commonly known as: MAGONATE Take 500 mg by mouth daily.   metFORMIN 1000 MG tablet Commonly known as: GLUCOPHAGE Take 1 tablet (1,000 mg total) by mouth 2 (two) times daily with a meal.   methocarbamol 500 MG tablet Commonly known as: ROBAXIN Take 500 mg by mouth every 8 (eight) hours as needed for muscle spasms.   methotrexate 50 MG/2ML injection Inject 20 mg into the skin once a week.  montelukast 10 MG tablet Commonly  known as: SINGULAIR Take 10 mg by mouth at bedtime.   multivitamin capsule Take 1 capsule by mouth daily.   nystatin 100000 UNIT/ML suspension Commonly known as: MYCOSTATIN Use as directed 5 mLs in the mouth or throat 4 (four) times daily. Twice daily   omeprazole 20 MG capsule Commonly known as: PRILOSEC Take 20 mg by mouth daily.   potassium chloride 10 MEQ tablet Commonly known as: KLOR-CON Take 1 tablet (10 mEq total) by mouth daily.   predniSONE 10 MG (21) Tbpk tablet Commonly known as: STERAPRED UNI-PAK 21 TAB Use per package instruction Replaces: predniSONE 10 MG tablet   Trelegy Ellipta 100-62.5-25 MCG/INH Aepb Generic drug: Fluticasone-Umeclidin-Vilant Inhale 1 puff into the lungs daily.   vitamin B-12 1000 MCG tablet Commonly known as: CYANOCOBALAMIN Take 1,000 mcg by mouth daily.   vitamin C 1000 MG tablet Take 1,000 mg by mouth daily.            Durable Medical Equipment  (From admission, onward)         Start     Ordered   10/01/19 0748  For home use only DME oxygen  Once    Question Answer Comment  Length of Need 6 Months   Mode or (Route) Nasal cannula   Frequency Continuous (stationary and portable oxygen unit needed)   Oxygen conserving device Yes   Oxygen delivery system Gas      10/01/19 0747            Diet and Activity recommendation: See Discharge Instructions above   Consults obtained -  None   Major procedures and Radiology Reports - PLEASE review detailed and final reports for all details, in brief -     Dg Chest 2 View  Result Date: 09/25/2019 CLINICAL DATA:  Shortness of breath, nonproductive cough, recently treated with antibiotics for pneumonia, onset of fever today; history asthma, COPD, diabetes mellitus, hypertension EXAM: CHEST - 2 VIEW COMPARISON:  11/17/2018 FINDINGS: Normal heart size, mediastinal contours, and pulmonary vascularity. Interstitial infiltrate at the mid to lower LEFT lung, minimally on RIGHT.  Remaining lungs clear. No pleural effusion or pneumothorax. 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 IMPRESSION: Interstitial infiltrates at the mid to lower LEFT lung, minimally on RIGHT. Electronically Signed   By: Ulyses SouthwardMark  Boles M.D.   On: 09/25/2019 16:04    Micro Results    Recent Results (from the past 240 hour(s))  SARS CORONAVIRUS 2 (TAT 6-24 HRS) Nasopharyngeal Nasopharyngeal Swab     Status: Abnormal   Collection Time: 09/25/19  7:31 PM   Specimen: Nasopharyngeal Swab  Result Value Ref Range Status   SARS Coronavirus 2 POSITIVE (A) NEGATIVE Final    Comment: RESULT CALLED TO, READ BACK BY AND VERIFIED WITH: R DAVID,RN [id number] 09/26/2019 D BRADLEY (NOTE) SARS-CoV-2 target nucleic acids are DETECTED. The SARS-CoV-2 RNA is generally detectable in upper and lower respiratory specimens during the acute phase of infection. Positive results are indicative of active infection with SARS-CoV-2. Clinical  correlation with patient history and other diagnostic information is necessary to determine patient infection status. Positive results do  not rule out bacterial infection or co-infection with other viruses. The expected result is Negative. Fact Sheet for Patients: HairSlick.nohttps://www.fda.gov/media/138098/download Fact Sheet for Healthcare Providers: quierodirigir.comhttps://www.fda.gov/media/138095/download This test is not yet approved or cleared by the [place] FDA and  has been authorized for detection and/or diagnosis of SARS-CoV-2 by FDA under an Emergency Use Authorization (EUA). This EUA  will remain  in effect (meaning this test can be used) for t he duration of the COVID-19 declaration under Section 564(b)(1) of the Act, 21 U.S.C. section 360bbb-3(b)(1), unless the authorization is terminated or revoked sooner. Performed at [facility], [place] [address], [place], [place] [address]   Culture, sputum-assessment     Status: None   Collection Time: 09/26/19  8:14 AM    Specimen: Expectorated Sputum  Result Value Ref Range Status   Specimen Description EXPECTORATED SPUTUM  Final   Special Requests NONE  Final   Sputum evaluation   Final    THIS SPECIMEN IS ACCEPTABLE FOR SPUTUM CULTURE Performed at [facility], [address] [place], [place] [address]    Report Status 09/26/2019 FINAL  Final  Culture, respiratory     Status: None   Collection Time: 09/26/19  8:14 AM  Result Value Ref Range Status   Specimen Description   Final    EXPECTORATED SPUTUM Performed at [facility], [address], [place], [place] [address]    Special Requests   Final    NONE Reflexed from [id number] Performed at [facility], [address] [place], [place] [address]    Gram Stain   Final    RARE WBC PRESENT, PREDOMINANTLY PMN NO ORGANISMS SEEN    Culture   Final    Consistent with normal respiratory flora. Performed at [facility], [place] [address], [place], [place] [address]    Report Status 09/30/2019 FINAL  Final       Today   Subjective:   Misty Briggs today has no headache,no chest abdominal pain,no new weakness tingling or numbness, feels much better wants to go home today.  She ambulated in the hallway with no hypoxia  Objective:   Blood pressure 123/67, pulse 82, temperature 98.4 F (36.9 C), temperature source Oral, resp. rate 18, height 5\' 6"  (1.676 m), weight 74.4 kg, SpO2 97 %.   Intake/Output Summary (Last 24 hours) at 10/01/2019 1223 Last data filed at 09/30/2019 1828 Gross per 24 hour  Intake 504.04 ml  Output -  Net 504.04 ml    Exam Awake Alert, Oriented x 3, No new F.N deficits, Normal affect Symmetrical Chest wall movement, Good air movement bilaterally, CTAB, wheezing has resolved RRR,No Gallops,Rubs or new Murmurs, No Parasternal Heave +ve B.Sounds, Abd Soft, Non tender, No organomegaly appriciated, No rebound -guarding or rigidity. No Cyanosis, Clubbing or edema, No new Rash or bruise   Data Review   CBC w Diff:  Lab Results  Component Value Date   WBC 9.0 10/01/2019   HGB 10.4 (L) 10/01/2019   HCT 32.6 (L) 10/01/2019   PLT 347 10/01/2019   LYMPHOPCT 4 10/01/2019   MONOPCT 5 10/01/2019   EOSPCT 0 10/01/2019   BASOPCT 0 10/01/2019    CMP:  Lab Results  Component Value Date   NA 135 10/01/2019   K 5.2 (H) 10/01/2019   CL 102 10/01/2019   CO2 26 10/01/2019   BUN 48 (H) 10/01/2019   CREATININE 1.34 (H) 10/01/2019   PROT 5.5 (L) 10/01/2019   ALBUMIN 2.9 (L) 10/01/2019   BILITOT 0.9 10/01/2019   ALKPHOS 75 10/01/2019   AST 22 10/01/2019   ALT 28 10/01/2019  .   Total Time in preparing paper work, data evaluation and todays exam - 54 minutes  Phillips Climes M.D on 10/01/2019 at 12:23 PM  [facility]   Office  [phone]

## 2019-10-01 NOTE — Progress Notes (Signed)
Pt updated husband on POC while I was in room

## 2019-10-01 NOTE — Care Management (Signed)
Clarified with Dr Landis Gandy and patient will not need home oxygen.

## 2019-10-01 NOTE — Progress Notes (Signed)
Inpatient Diabetes Program Recommendations  AACE/ADA: New Consensus Statement on Inpatient Glycemic Control   Target Ranges:  Prepandial:   less than 140 mg/dL      Peak postprandial:   less than 180 mg/dL (1-2 hours)      Critically ill patients:  140 - 180 mg/dL  Results for Misty Briggs, Misty Briggs (MRN [id number]) as of 10/01/2019 10:44  Ref. Range 09/30/2019 11:55 09/30/2019 16:25 09/30/2019 20:38 10/01/2019 07:39  Glucose-Capillary Latest Ref Range: 70 - 99 mg/dL 328 (H) 297 (H) 227 (H) 130 (H)    Review of Glycemic Control  Diabetes history: DM2 Outpatient Diabetes medications: Metformin 1000 mg BID Current orders for Inpatient glycemic control: Lantus 14 units daily, Novolog 0-9 units TID with meals, Novolog 0-5 units QHS; Solumedrol 40 mg Q12H  Inpatient Diabetes Program Recommendations:   Insulin-Meal Coverage: If steroids are continued, please consider ordering Novolog 5 units TID with meals for meal coverage if patient eats at least 50% of meals.  Thanks, Barnie Alderman, RN, MSN, CDE Diabetes Coordinator Inpatient Diabetes Program [phone] (Team Pager from 8am to 5pm)

## 2019-10-01 NOTE — Care Management Important Message (Signed)
Important Message  Patient Details  Name: Misty Briggs MRN: [id number] Date of Birth: Jun 15, 1951   Medicare Important Message Given:  Yes - Important Message mailed due to current National Emergency  Verbal consent obtained due to current National Emergency  Relationship to patient: Spouse/Significant Other Contact Name: Rilla Buckman Call Date: 10/01/19  Time: 1428 Phone: [phone] Outcome: Spoke with contact Important Message mailed to: Other (must enter comment)(Mr Archibald declined a copy of the IM, but is aware one is available if needed.)    Delorse Lek 10/01/2019, 2:29 PM

## 2019-10-01 NOTE — Progress Notes (Signed)
Brief Pharmacy Note  Patient is a 68 y/o F with hx of COPD/asthma, RA, DM, HTN, CAD who presented to [facility] ED 11/3 c/o cough, SOB, fever x 3 days and was diagnosed with COVID-19. She was subsequently transferred to [facility] for further treatment. She is not being treated for bacterial pneumonia. Respiratory culture obtained at ED visit results were reported as consistent with normal respiratory flora. Based on this information, no further intervention indicated.   [place] Resident 01 October 2019

## 2019-10-01 NOTE — Discharge Instructions (Signed)
Person Under Monitoring Name: Misty Briggs  Location: [address] [place] [address]   Infection Prevention Recommendations for Individuals Confirmed to have, or Being Evaluated for, 2019 Novel Coronavirus (COVID-19) Infection Who Receive Care at Home  Individuals who are confirmed to have, or are being evaluated for, COVID-19 should follow the prevention steps below until a healthcare provider or local or state health department says they can return to normal activities.  Stay home except to get medical care You should restrict activities outside your home, except for getting medical care. Do not go to work, school, or public areas, and do not use public transportation or taxis.  Call ahead before visiting your doctor Before your medical appointment, call the healthcare provider and tell them that you have, or are being evaluated for, COVID-19 infection. This will help the healthcare providers office take steps to keep other people from getting infected. Ask your healthcare provider to call the local or state health department.  Monitor your symptoms Seek prompt medical attention if your illness is worsening (e.g., difficulty breathing). Before going to your medical appointment, call the healthcare provider and tell them that you have, or are being evaluated for, COVID-19 infection. Ask your healthcare provider to call the local or state health department.  Wear a facemask You should wear a facemask that covers your nose and mouth when you are in the same room with other people and when you visit a healthcare provider. People who live with or visit you should also wear a facemask while they are in the same room with you.  Separate yourself from other people in your home As much as possible, you should stay in a different room from other people in your home. Also, you should use a separate bathroom, if available.  Avoid sharing household items You should not share  dishes, drinking glasses, cups, eating utensils, towels, bedding, or other items with other people in your home. After using these items, you should wash them thoroughly with soap and water.  Cover your coughs and sneezes Cover your mouth and nose with a tissue when you cough or sneeze, or you can cough or sneeze into your sleeve. Throw used tissues in a lined trash can, and immediately wash your hands with soap and water for at least 20 seconds or use an alcohol-based hand rub.  Wash your [facility] your hands often and thoroughly with soap and water for at least 20 seconds. You can use an alcohol-based hand sanitizer if soap and water are not available and if your hands are not visibly dirty. Avoid touching your eyes, nose, and mouth with unwashed hands.   Prevention Steps for Caregivers and Household Members of Individuals Confirmed to have, or Being Evaluated for, COVID-19 Infection Being Cared for in the Home  If you live with, or provide care at home for, a person confirmed to have, or being evaluated for, COVID-19 infection please follow these guidelines to prevent infection:  Follow healthcare providers instructions Make sure that you understand and can help the patient follow any healthcare provider instructions for all care.  Provide for the patients basic needs You should help the patient with basic needs in the home and provide support for getting groceries, prescriptions, and other personal needs.  Monitor the patients symptoms If they are getting sicker, call his or her medical provider and tell them that the patient has, or is being evaluated for, COVID-19 infection. This will help the healthcare providers office  take steps to keep other people from getting infected. Ask the healthcare provider to call the local or state health department.  Limit the number of people who have contact with the patient  If possible, have only one caregiver for the patient.  Other  household members should stay in another home or place of residence. If this is not possible, they should stay  in another room, or be separated from the patient as much as possible. Use a separate bathroom, if available.  Restrict visitors who do not have an essential need to be in the home.  Keep older adults, very young children, and other sick people away from the patient Keep older adults, very young children, and those who have compromised immune systems or chronic health conditions away from the patient. This includes people with chronic heart, lung, or kidney conditions, diabetes, and cancer.  Ensure good ventilation Make sure that shared spaces in the home have good air flow, such as from an air conditioner or an opened window, weather permitting.  Wash your hands often  Wash your hands often and thoroughly with soap and water for at least 20 seconds. You can use an alcohol based hand sanitizer if soap and water are not available and if your hands are not visibly dirty.  Avoid touching your eyes, nose, and mouth with unwashed hands.  Use disposable paper towels to dry your hands. If not available, use dedicated cloth towels and replace them when they become wet.  Wear a facemask and gloves  Wear a disposable facemask at all times in the room and gloves when you touch or have contact with the patients blood, body fluids, and/or secretions or excretions, such as sweat, saliva, sputum, nasal mucus, vomit, urine, or feces.  Ensure the mask fits over your nose and mouth tightly, and do not touch it during use.  Throw out disposable facemasks and gloves after using them. Do not reuse.  Wash your hands immediately after removing your facemask and gloves.  If your personal clothing becomes contaminated, carefully remove clothing and launder. Wash your hands after handling contaminated clothing.  Place all used disposable facemasks, gloves, and other waste in a lined container before  disposing them with other household waste.  Remove gloves and wash your hands immediately after handling these items.  Do not share dishes, glasses, or other household items with the patient  Avoid sharing household items. You should not share dishes, drinking glasses, cups, eating utensils, towels, bedding, or other items with a patient who is confirmed to have, or being evaluated for, COVID-19 infection.  After the person uses these items, you should wash them thoroughly with soap and water.  Wash laundry thoroughly  Immediately remove and wash clothes or bedding that have blood, body fluids, and/or secretions or excretions, such as sweat, saliva, sputum, nasal mucus, vomit, urine, or feces, on them.  Wear gloves when handling laundry from the patient.  Read and follow directions on labels of laundry or clothing items and detergent. In general, wash and dry with the warmest temperatures recommended on the label.  Clean all areas the individual has used often  Clean all touchable surfaces, such as counters, tabletops, doorknobs, bathroom fixtures, toilets, phones, keyboards, tablets, and bedside tables, every day. Also, clean any surfaces that may have blood, body fluids, and/or secretions or excretions on them.  Wear gloves when cleaning surfaces the patient has come in contact with.  Use a diluted bleach solution (e.g., dilute bleach with 1 part  bleach and 10 parts water) or a household disinfectant with a label that says EPA-registered for coronaviruses. To make a bleach solution at home, add 1 tablespoon of bleach to 1 quart (4 cups) of water. For a larger supply, add  cup of bleach to 1 gallon (16 cups) of water.  Read labels of cleaning products and follow recommendations provided on product labels. Labels contain instructions for safe and effective use of the cleaning product including precautions you should take when applying the product, such as wearing gloves or eye protection  and making sure you have good ventilation during use of the product.  Remove gloves and wash hands immediately after cleaning.  Monitor yourself for signs and symptoms of illness Caregivers and household members are considered close contacts, should monitor their health, and will be asked to limit movement outside of the home to the extent possible. Follow the monitoring steps for close contacts listed on the symptom monitoring form.   ? If you have additional questions, contact your local health department or call the epidemiologist on call at [phone] (available 24/7). ? This guidance is subject to change. For the most up-to-date guidance from [facility], please refer to their website: [email or web address]

## 2019-10-24 NOTE — Discharge Summary (Signed)
Physician Discharge Summary  Misty Briggs ATF:[id number] DOB: 10/30/1951 DOA: 09/25/2019  PCP: Maryland Pink, MD  Admit date: 09/25/2019 Discharge date: 10/24/2019  Admitted From: Home  Disposition:  [facility] transfer     Brief/Interim Summary: Misty Briggs is a 68 y.o. F with COPD/asthma, RA on MTX/pred, DM, HTN, CAD who presented with few weeks malaise cough, now with new fever for 3 days.  Had had bronchitis for a few weeks, been treated with antibiotics as an outpatient then in the last week, had traveled to [facility] and then developed fever.  In the ER, SARS-CoV-2 positive, CXR with bilateral infiltrates, SpO2 91-92% on room air.      PRINCIPAL HOSPITAL DIAGNOSIS: COVID 19    Discharge Diagnoses:    COVID -Continue remdesivir -Continue Decadron -Albuterol-ipratropium PRN -Cough syrup PRN -Reasonable to continue antibiotics for now, somewhat asymmetric opacities, cannot rule out concomitant acute bacterial pneumonia   Diabetes -Hold metformin -SS correction insulin  Hypertension Coronary disease secondary prevention -Continue aspirin -Continue HCTZ, irbesartan, Lasix/K -Continue pravastatin  Anemia   -Continue iron  COPD  No wheezing -Continue Singulair, PPI, Trelegy  CKD III  Baseline Cr 1.4 since last Dec at least.  Stable relative to baseline  RA No active disease -Hold prednisone (on dex), MTX                Discharge Instructions   Allergies as of 09/26/2019      Reactions   Penicillins Hives, Palpitations, Rash   Azithromycin Other (See Comments)   Thrush   Levofloxacin Other (See Comments)   Joint pain/muscle pain   Sulfa Antibiotics Rash      Medication List    ASK your doctor about these medications   aspirin EC 81 MG tablet Take 81 mg by mouth daily.   ferrous sulfate 325 (65 FE) MG tablet Take 1 tablet (325 mg total) by mouth 2 (two) times daily with a meal.   folic acid 1 MG tablet Commonly known as:  FOLVITE Take 2 mg by mouth daily.   hydrOXYzine 25 MG tablet Commonly known as: ATARAX/VISTARIL Take 50 mg by mouth every 8 (eight) hours as needed.   levocetirizine 5 MG tablet Commonly known as: XYZAL Take 5 mg by mouth every evening.   lovastatin 20 MG tablet Commonly known as: MEVACOR Take 20 mg by mouth at bedtime.   metFORMIN 1000 MG tablet Commonly known as: GLUCOPHAGE Take 1 tablet (1,000 mg total) by mouth 2 (two) times daily with a meal.   methotrexate 50 MG/2ML injection Inject 20 mg into the skin once a week. Ask about: Which instructions should I use?   montelukast 10 MG tablet Commonly known as: SINGULAIR Take 10 mg by mouth at bedtime.   omeprazole 20 MG capsule Commonly known as: PRILOSEC Take 20 mg by mouth daily.   potassium chloride 10 MEQ tablet Commonly known as: KLOR-CON Take 1 tablet (10 mEq total) by mouth daily.   Trelegy Ellipta 100-62.5-25 MCG/INH Aepb Generic drug: Fluticasone-Umeclidin-Vilant Inhale 1 puff into the lungs daily.       Allergies  Allergen Reactions  . Penicillins Hives, Palpitations and Rash  . Azithromycin Other (See Comments)    Thrush  . Levofloxacin Other (See Comments)    Joint pain/muscle pain  . Sulfa Antibiotics Rash    Consultations:     Procedures/Studies: Dg Chest 2 View  Result Date: 09/25/2019 CLINICAL DATA:  Shortness of breath, nonproductive cough, recently treated with antibiotics for pneumonia, onset of fever  today; history asthma, COPD, diabetes mellitus, hypertension EXAM: CHEST - 2 VIEW COMPARISON:  11/17/2018 FINDINGS: Normal heart size, mediastinal contours, and pulmonary vascularity. Interstitial infiltrate at the mid to lower LEFT lung, minimally on RIGHT. Remaining lungs clear. No pleural effusion or pneumothorax. 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 IMPRESSION: Interstitial infiltrates at the mid to lower LEFT lung, minimally on RIGHT. Electronically Signed    By: Ulyses Southward M.D.   On: 09/25/2019 16:04       Subjective: Feeeling tired, chest tight, coughing. No confusion, vomiting, chest pain.  Discharge Exam: Vitals:   09/26/19 1638 09/26/19 1656  BP: 125/81 130/88  Pulse: (!) 101 97  Resp:  (!) 28  Temp:  99.7 F (37.6 C)  SpO2: 94% 98%   Vitals:   09/26/19 1554 09/26/19 1637 09/26/19 1638 09/26/19 1656  BP: 138/75  125/81 130/88  Pulse: 88 98 (!) 101 97  Resp: (!) 28 (!) 27  (!) 28  Temp:    99.7 F (37.6 C)  TempSrc:    Oral  SpO2: 98% 95% 94% 98%  Weight:      Height:        See progress note from earlier today   The results of significant diagnostics from this hospitalization (including imaging, microbiology, ancillary and laboratory) are listed below for reference.     Microbiology: No results found for this or any previous visit (from the past 240 hour(s)).   Labs: BNP (last 3 results) No results for input(s): BNP in the last 8760 hours. Basic Metabolic Panel: No results for input(s): NA, K, CL, CO2, GLUCOSE, BUN, CREATININE, CALCIUM, MG, PHOS in the last 168 hours. Liver Function Tests: No results for input(s): AST, ALT, ALKPHOS, BILITOT, PROT, ALBUMIN in the last 168 hours. No results for input(s): LIPASE, AMYLASE in the last 168 hours. No results for input(s): AMMONIA in the last 168 hours. CBC: No results for input(s): WBC, NEUTROABS, HGB, HCT, MCV, PLT in the last 168 hours. Cardiac Enzymes: No results for input(s): CKTOTAL, CKMB, CKMBINDEX, TROPONINI in the last 168 hours. BNP: Invalid input(s): POCBNP CBG: No results for input(s): GLUCAP in the last 168 hours. D-Dimer No results for input(s): DDIMER in the last 72 hours. Hgb A1c No results for input(s): HGBA1C in the last 72 hours. Lipid Profile No results for input(s): CHOL, HDL, LDLCALC, TRIG, CHOLHDL, LDLDIRECT in the last 72 hours. Thyroid function studies No results for input(s): TSH, T4TOTAL, T3FREE, THYROIDAB in the last 72  hours.  Invalid input(s): FREET3 Anemia work up No results for input(s): VITAMINB12, FOLATE, FERRITIN, TIBC, IRON, RETICCTPCT in the last 72 hours. Urinalysis    Component Value Date/Time   COLORURINE YELLOW (A) 09/25/2019 1826   APPEARANCEUR HAZY (A) 09/25/2019 1826   LABSPEC 1.017 09/25/2019 1826   PHURINE 5.0 09/25/2019 1826   GLUCOSEU NEGATIVE 09/25/2019 1826   HGBUR NEGATIVE 09/25/2019 1826   BILIRUBINUR NEGATIVE 09/25/2019 1826   KETONESUR NEGATIVE 09/25/2019 1826   PROTEINUR 30 (A) 09/25/2019 1826   NITRITE NEGATIVE 09/25/2019 1826   LEUKOCYTESUR NEGATIVE 09/25/2019 1826   Sepsis Labs Invalid input(s): PROCALCITONIN,  WBC,  LACTICIDVEN Microbiology No results found for this or any previous visit (from the past 240 hour(s)).   Time coordinating discharge: 35 minutes      SIGNED:   Alberteen Sam, MD  [facility] 09/26/2019, 9:15 AM

## 2019-11-05 ENCOUNTER — Ambulatory Visit (INDEPENDENT_AMBULATORY_CARE_PROVIDER_SITE_OTHER): Payer: Medicare Other | Admitting: Pulmonary Disease

## 2019-11-05 ENCOUNTER — Encounter: Payer: Self-pay | Admitting: General Surgery

## 2019-11-05 ENCOUNTER — Other Ambulatory Visit: Payer: Self-pay

## 2019-11-05 ENCOUNTER — Encounter: Payer: Self-pay | Admitting: Pulmonary Disease

## 2019-11-05 DIAGNOSIS — J4531 Mild persistent asthma with (acute) exacerbation: Secondary | ICD-10-CM

## 2019-11-05 DIAGNOSIS — J1289 Other viral pneumonia: Secondary | ICD-10-CM | POA: Diagnosis not present

## 2019-11-05 DIAGNOSIS — J1282 Pneumonia due to coronavirus disease 2019: Secondary | ICD-10-CM

## 2019-11-05 DIAGNOSIS — J9601 Acute respiratory failure with hypoxia: Secondary | ICD-10-CM

## 2019-11-05 DIAGNOSIS — M069 Rheumatoid arthritis, unspecified: Secondary | ICD-10-CM | POA: Diagnosis not present

## 2019-11-05 DIAGNOSIS — U071 COVID-19: Secondary | ICD-10-CM | POA: Diagnosis not present

## 2019-11-05 MED ORDER — PREDNISONE 10 MG PO TABS: ORAL_TABLET | ORAL | 0 refills | Status: DC

## 2019-11-05 NOTE — Assessment & Plan Note (Addendum)
Okay to get back on arthritis pill-etodolac since your kidney function has normalized She is back on methotrexate weekly Not clear if she has rheumatoid lung, will need to review imaging studies, chest x-ray from 2019 does not show any infiltrates so presumably interstitial infiltrates seen was all related to Covid

## 2019-11-05 NOTE — Progress Notes (Signed)
Subjective:    Patient ID: Misty Briggs, female    DOB: [birth year], 68 y.o.   MRN: [id number]  HPI   Chief Complaint  Patient presents with  . Consult    Shortness of breath, wheezing since September.     68 year old never smoker with "asthma and rheumatoid arthritis presents to establish care after recent Covid infection. She reports asthma onset about 15 years ago when she started seeing Dr. Raul Del at [facility].  After many bronchodilators, she has been maintained on Trelegy for the past couple of years.  She reports exacerbations due to seasonal allergies during fall. She also has rheumatoid arthritis and is maintained on weekly methotrexate  She developed symptoms around September with recurrent cough and wheezing and was treated as her usual exacerbation, had 2-3 visits with her pulmonologist and finally was sent in the emergency room on 11/3 when she was diagnosed with Covid pneumonia.  Chest x-ray 11/3 showed left more than right basal interstitial infiltrates, I personally reviewed this film.  She was hospitalized at [facility] and 11/9, treated with remdesivir and steroids, and on discharge did not require oxygen.  Sick contacts at home were her husband and son both of whom had infection.  Her husband uses oxygen and she has been using this on an as-needed basis since then. I have reviewed the hospital discharge summary, since her creatinine was increased to 1.3, her Micardis Lasix and etodolic was stopped  She continues to have shortness of breath and cough productive of mostly white phlegm.  She saw her PCP and was given a course of doxycycline.  Blood work was done 11/30 which I reviewed which showed that her creatinine had normalized to 0.9.  A chest x-ray was also performed, did not have results of this at the time of dictation  She reports intermittent wheezing and coughing.  Her whole body hurting feeling has somewhat improved  Significant tests/ events reviewed PFTs  01/2018 [facility] clinic    Past Medical History:  Diagnosis Date  . Arthritis   . Asthma   . COPD (chronic obstructive pulmonary disease) (HCC)    Dr. Raul Del, [facility] pulmonologist  . Diabetes mellitus   . GERD (gastroesophageal reflux disease)   . Heart murmur   . HLD (hyperlipidemia)   . Hypertension   . Myocardial infarction (HCC)    mild  . PONV (postoperative nausea and vomiting)    itching sometimes  . Rheumatoid arthritis(714.0)   . Shortness of breath     Past Surgical History:  Procedure Laterality Date  . ANTERIOR CERVICAL DECOMP/DISCECTOMY FUSION  05/05/2012   Procedure: ANTERIOR CERVICAL DECOMPRESSION/DISCECTOMY FUSION 1 LEVEL;  Surgeon: Floyce Stakes, MD;  Location: [facility] NEURO ORS;  Service: Neurosurgery;  Laterality: N/A;  Cervical six seven Anterior cervical decompression/diskectomy, Fusion, Plate  . COLONOSCOPY WITH PROPOFOL N/A 09/08/2017   Procedure: COLONOSCOPY WITH PROPOFOL;  Surgeon: Lollie Sails, MD;  Location: [facility] ENDOSCOPY;  Service: Endoscopy;  Laterality: N/A;  . ENDOMETRIAL ABLATION     novasure  . FOOT SURGERY  2015  . ROTATOR CUFF REPAIR     bilat  . TRANSTHORACIC ECHOCARDIOGRAM  2010   [facility]  . TUBAL LIGATION      Allergies  Allergen Reactions  . Penicillins Hives, Palpitations and Rash  . Azithromycin Other (See Comments)    Thrush  . Levofloxacin Other (See Comments)    Joint pain/muscle pain  . Sulfa Antibiotics Rash    Social  History   Socioeconomic History  . Marital status: Married    Spouse name: Not on file  . Number of children: 3  . Years of education: Not on file  . Highest education level: Not on file  Occupational History  . Not on file  Tobacco Use  . Smoking status: Never Smoker  . Smokeless tobacco: Never Used  Substance and Sexual Activity  . Alcohol use: Not Currently    Alcohol/week: 0.0 standard drinks  . Drug use: No  . Sexual activity: Not on file  Other  Topics Concern  . Not on file  Social History Narrative  . Not on file   Social Determinants of Health   Financial Resource Strain: Low Risk   . Difficulty of Paying Living Expenses: Not hard at all  Food Insecurity: No Food Insecurity  . Worried About Programme researcher, broadcasting/film/video in the Last Year: Never true  . Ran Out of Food in the Last Year: Never true  Transportation Needs: No Transportation Needs  . Lack of Transportation (Medical): No  . Lack of Transportation (Non-Medical): No  Physical Activity: Unknown  . Days of Exercise per Week: Patient refused  . Minutes of Exercise per Session: Patient refused  Stress:   . Feeling of Stress : Not on file  Social Connections: Unknown  . Frequency of Communication with Friends and Family: More than three times a week  . Frequency of Social Gatherings with Friends and Family: More than three times a week  . Attends Religious Services: Not on file  . Active Member of Clubs or Organizations: Not on file  . Attends Banker Meetings: Not on file  . Marital Status: Married  Catering manager Violence:   . Fear of Current or Ex-Partner: Not on file  . Emotionally Abused: Not on file  . Physically Abused: Not on file  . Sexually Abused: Not on file     Family History  Problem Relation Age of Onset  . Breast cancer Paternal Aunt       Review of Systems  Constitutional: negative for anorexia, fevers and sweats  Eyes: negative for irritation, redness and visual disturbance  Ears, nose, mouth, throat, and face: negative for earaches, epistaxis, nasal congestion and sore throat   Cardiovascular: negative for chest pain, dyspnea, lower extremity edema, orthopnea, palpitations and syncope  Gastrointestinal: negative for abdominal pain, constipation, diarrhea, melena, nausea and vomiting positive for acid heartburn Genitourinary:negative for dysuria, frequency and hematuria  Hematologic/lymphatic: negative for bleeding, easy  bruising and lymphadenopathy  Musculoskeletal:negative for arthralgias, muscle weakness positive for stiff joints  Neurological: negative for coordination problems, gait problems, headaches and weakness  Endocrine: negative for diabetic symptoms including polydipsia, polyuria and weight loss     Objective:   Physical Exam   Gen. Pleasant, well-nourished, in mild respiratory distress, anxious affect ENT - no pallor,icterus, no post nasal drip Neck: No JVD, no thyromegaly, no carotid bruits Lungs: no use of accessory muscles, no dullness to percussion, bilateral diffuse expiratory rhonchi  Cardiovascular: Rhythm regular, heart sounds  normal, no murmurs or gallops, no peripheral edema Abdomen: soft and non-tender, no hepatosplenomegaly, BS normal. Musculoskeletal: No deformities, no cyanosis or clubbing Neuro:  alert, non focal        Assessment & Plan:

## 2019-11-05 NOTE — Assessment & Plan Note (Signed)
She seems to be having residual symptoms. We will obtain chest x-ray results from 11/30 but essentially treat her with steroids for pneumonitis Short course of prednisone as outlined

## 2019-11-05 NOTE — Assessment & Plan Note (Signed)
Use oxygen only on an as-needed basis

## 2019-11-05 NOTE — Assessment & Plan Note (Addendum)
Due to wheezing and bronchospasm today, will treat as exacerbation Prednisone 10 mg tabs Take 4 tabs  daily with food x 3 days, then 3 tabs daily x 3 days, then 2 tabs daily x 3 days, then 1 tab daily x5 days then stop. #40  Okay to resume albuterol nebs every 6 hours as needed. Stay on Trelegy  Obtain chest x-ray and PFTs from [facility] clinic

## 2019-11-05 NOTE — Patient Instructions (Signed)
Prednisone 10 mg tabs Take 4 tabs  daily with food x 3 days, then 3 tabs daily x 3 days, then 2 tabs daily x 3 days, then 1 tab daily x5 days then stop. #40  Okay to resume albuterol nebs every 6 hours as needed. Stay on Trelegy  Okay to get back on arthritis pill-etodolac since your kidney function has normalized  Obtain chest x-ray and PFTs from [facility] clinic

## 2019-11-19 ENCOUNTER — Telehealth: Payer: Self-pay | Admitting: Pulmonary Disease

## 2019-11-19 NOTE — Telephone Encounter (Signed)
Spoke with pt, aware that appt will be in-person. Nothing further needed at this time- will close encounter.

## 2019-11-19 NOTE — Telephone Encounter (Signed)
Pt is scheduled for a 2 week rov with cxr tomorrow to follow up on covid related pneumonia.   Per chart, pt tested positive on 09/25/2019 for Covid-19. Pt was seen in clinic on 12/14 and is experiencing no new symptoms since this visit, but does still have sob and cough.  MW please advise if you feel like it is safe for pt to keep her appt tomorrow with RA in-person.  Thanks!

## 2019-11-19 NOTE — Telephone Encounter (Signed)
ATC patient. She did not answer and VM was not setup. Will attempt to call back later today.

## 2019-11-19 NOTE — Telephone Encounter (Signed)
Should be fine but obviously should wear mask

## 2019-11-20 ENCOUNTER — Other Ambulatory Visit: Payer: Self-pay

## 2019-11-20 ENCOUNTER — Other Ambulatory Visit
Admission: RE | Admit: 2019-11-20 | Discharge: 2019-11-20 | Disposition: A | Discharge status: Home / Self Care | Payer: Medicare Other | Source: Ambulatory Visit | Attending: Pulmonary Disease | Admitting: Pulmonary Disease

## 2019-11-20 ENCOUNTER — Ambulatory Visit (INDEPENDENT_AMBULATORY_CARE_PROVIDER_SITE_OTHER): Payer: Medicare Other | Admitting: Pulmonary Disease

## 2019-11-20 ENCOUNTER — Ambulatory Visit
Admission: RE | Admit: 2019-11-20 | Discharge: 2019-11-20 | Disposition: A | Discharge status: Home / Self Care | Payer: Medicare Other | Source: Ambulatory Visit | Attending: Pulmonary Disease | Admitting: Pulmonary Disease

## 2019-11-20 ENCOUNTER — Ambulatory Visit: Payer: PRIVATE HEALTH INSURANCE | Admitting: Pulmonary Disease

## 2019-11-20 ENCOUNTER — Encounter: Payer: Self-pay | Admitting: Pulmonary Disease

## 2019-11-20 VITALS — BP 148/80 | HR 90 | Temp 97.0°F | Ht 66.0 in | Wt 163.4 lb

## 2019-11-20 DIAGNOSIS — J4540 Moderate persistent asthma, uncomplicated: Secondary | ICD-10-CM | POA: Diagnosis not present

## 2019-11-20 DIAGNOSIS — Z8709 Personal history of other diseases of the respiratory system: Secondary | ICD-10-CM

## 2019-11-20 DIAGNOSIS — Z7722 Contact with and (suspected) exposure to environmental tobacco smoke (acute) (chronic): Secondary | ICD-10-CM

## 2019-11-20 DIAGNOSIS — R0602 Shortness of breath: Secondary | ICD-10-CM

## 2019-11-20 DIAGNOSIS — Z8619 Personal history of other infectious and parasitic diseases: Secondary | ICD-10-CM

## 2019-11-20 DIAGNOSIS — J449 Chronic obstructive pulmonary disease, unspecified: Secondary | ICD-10-CM | POA: Diagnosis not present

## 2019-11-20 DIAGNOSIS — M069 Rheumatoid arthritis, unspecified: Secondary | ICD-10-CM | POA: Diagnosis not present

## 2019-11-20 DIAGNOSIS — J1282 Pneumonia due to coronavirus disease 2019: Secondary | ICD-10-CM

## 2019-11-20 LAB — CREATININE, SERUM
Creatinine, Ser: 1.03 mg/dL — ABNORMAL HIGH (ref 0.44–1.00)
GFR calc Af Amer: >60 mL/min (ref >60)
GFR calc non Af Amer: 56 mL/min — ABNORMAL LOW (ref >60)

## 2019-11-20 MED ORDER — IOHEXOL 350 MG/ML SOLN
75.0000 mL | Freq: Once | INTRAVENOUS | Status: AC | PRN
  Administered 2019-11-20: 75 mL via INTRAVENOUS

## 2019-11-20 NOTE — Patient Instructions (Addendum)
1.  We will get a CT chest to evaluate for potential clots in the lung this will also allow [place] to see if there is any residual damage to the lung from COVID.  2.  Continue using Trelegy Ellipta.  Rinse mouth well after use with some baking soda in the water.  You may also gargle some with this type of solution.  3.  Will await the results of the CT scan to determine what if any other medication you need to take.  4.  We will see you in follow-up in 2 to 3 weeks time.  Call sooner should any new difficulties arise.

## 2019-11-20 NOTE — Progress Notes (Signed)
Subjective:    Patient ID: Misty Briggs, female    DOB: [birth year], 68 y.o.   MRN: [id number]  HPI Patient is a 68 year old lifelong never smoker (significant exposure to secondhand smoke) with asthma and rheumatoid arthritis who presents for evaluation of persistent dyspnea since diagnosis of Covid.  She was seen initially by Dr. Kara Mead on 14 December to establish care with him after Covid infection.  She was diagnosed with Covid initially on November 3.  She was previously followed by Dr. Raul Del and has been diagnosed with asthma with a COPD component.  Patient was hospitalized at [facility] on 9 November and treated with remdesivir and steroids.  She did not require oxygen upon discharge.  Since her visit to Dr. Elsworth Soho she continues to have issues with wheezing and coughing.  She was originally on Trelegy (previously prescribed by Dr. Raul Del) but she states that she cannot tolerate it due to recurrent thrush on the medication.  Dr. Elsworth Soho correctly diagnosed a prolonged or late pulmonary phase of COVID-19.  She was noted to have a mild exacerbation was treated with prednisone and was instructed to stay on Trelegy and use albuterol as needed.  She presents today with continued issues with cough productive of white to yellow sputum.  She did not get a chest x-ray performed as requested she continues to experience significant dyspnea on exertion and wheezing.  She has not had myalgias or arthralgias.  No anosmia or dysgeusia.  I have reviewed Dr. Bari Mantis notes as well as Dr. Gust Brooms and Dr. Scharlene Gloss notes.    Review of Systems A 10 point review of systems was performed and it is as noted above otherwise, negative.    Objective:   Physical Exam BP (!) 148/80 (BP Location: Left Arm, Patient Position: Sitting, Cuff Size: Large)   Pulse 90   Temp (!) 97 F (36.1 C) (Temporal)   Ht 5\' 6"  (1.676 m)   Wt 163 lb 6.4 oz (74.1 kg)   SpO2 95% Comment: on RA  BMI 26.37 kg/m  GENERAL:  Awake,alert, anxious demeanor, fully ambulatory, mild conversational dyspnea HEAD: Normocephalic, atraumatic.  EYES: Pupils equal, round, reactive to light.  No scleral icterus.  MOUTH: Nose/mouth/throat not examined due to masking requirements for COVID 19. NECK: Supple. No thyromegaly. No nodules. No JVD.  No tracheal deviation. PULMONARY: No respiratory distress.  Coarse breath sounds to auscultation bilaterally.  No other adventitious sounds. CARDIOVASCULAR: S1 and S2. Regular rate and rhythm.  GASTROINTESTINAL: No distention noted MUSCULOSKELETAL: RA changes on both hands, no clubbing, no edema.  NEUROLOGIC: No focal deficits, fluent speech. SKIN: Intact,warm,dry.  Limited exam shows no rashes PSYCH: Anxious, behavior otherwise appropriate.      Assessment & Plan:  Shortness of breath Recent COVID-19 pneumonitis Suspect late pulmonary phase of COVID-19 Cannot rule out PE CT angio chest rule out PE Determination of therapy pending CT angio Explained to patient that her symptoms may persist after COVID-19 due to late pulmonary phase/long Covid.  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   Renold Don, MD [facility]   *This note was dictated using voice recognition software/Dragon.  Despite best  efforts to proofread, errors can occur which can change the meaning.  Any change was purely unintentional.

## 2019-11-21 ENCOUNTER — Telehealth: Payer: Self-pay

## 2019-11-21 DIAGNOSIS — R0602 Shortness of breath: Secondary | ICD-10-CM

## 2019-11-21 MED ORDER — PREDNISONE 10 MG (21) PO TBPK: ORAL_TABLET | ORAL | 0 refills | Status: DC

## 2019-11-21 NOTE — Telephone Encounter (Signed)
Pt is aware of results and voiced her understanding.  Echo has been ordered.  Prednisone taper has sent to preferred pharmacy.  Nothing further is needed.

## 2019-11-21 NOTE — Telephone Encounter (Signed)
-----   Message from Tyler Pita, MD sent at 11/20/2019  4:44 PM EST ----- No PE.  Some areas of atelectasis likely from shallow breathing due to the uncomfortable sensation she has in her chest.  We are going to order a 2D echo.  We will give her 1 more prednisone taper pack.  Follow-up as previously scheduled.

## 2019-12-03 ENCOUNTER — Encounter: Payer: Self-pay | Admitting: Pulmonary Disease

## 2019-12-03 ENCOUNTER — Ambulatory Visit (INDEPENDENT_AMBULATORY_CARE_PROVIDER_SITE_OTHER): Payer: Medicare Other | Admitting: Pulmonary Disease

## 2019-12-03 ENCOUNTER — Other Ambulatory Visit: Payer: Self-pay

## 2019-12-03 VITALS — BP 148/82 | HR 86 | Temp 97.2°F | Ht 65.0 in | Wt 162.6 lb

## 2019-12-03 DIAGNOSIS — R0602 Shortness of breath: Secondary | ICD-10-CM

## 2019-12-03 DIAGNOSIS — K219 Gastro-esophageal reflux disease without esophagitis: Secondary | ICD-10-CM | POA: Diagnosis not present

## 2019-12-03 DIAGNOSIS — M069 Rheumatoid arthritis, unspecified: Secondary | ICD-10-CM

## 2019-12-03 DIAGNOSIS — J4540 Moderate persistent asthma, uncomplicated: Secondary | ICD-10-CM

## 2019-12-03 MED ORDER — ANORO ELLIPTA 62.5-25 MCG/INH IN AEPB: 1.0000 | INHALATION_SPRAY | Freq: Every day | RESPIRATORY_TRACT | 0 refills | Status: AC

## 2019-12-03 NOTE — Progress Notes (Signed)
Subjective:    Patient ID: Misty Briggs, female    DOB: 1951/01/20, 69 y.o.   MRN: [id number]  HPI This is a 69 year old lifelong never smoker (albeit significant secondhand smoke) with a history of asthma rheumatoid arthritis who was first evaluated here on 20 November 2019 after COVID-19 infection.  She has had persistent symptomatology of dyspnea on exertion.  She had a CT angio chest performed on 29 December that showed no PE, she had mild groundglass attenuation not consistent with COVID-19 and more consistent with hypersensitivity pneumonitis or interstitial edema.  Of note patient does have rheumatoid arthritis and changes could be secondary to this as well.  Recall that after her admission to [facility] on the latter part of November she did not require oxygen with ambulation.  However she had neglected to tell us that she is using oxygen nocturnally.  She has had difficulties tolerating Trelegy which she had been using previously due to persistent issues with thrush despite rinsing her mouth well.  She also has increasing issues with gastroesophageal reflux.  States that she has an upcoming visit with cardiology.   She continues to have significant dyspnea on exertion no fevers, chills or sweats.  No orthopnea or paroxysmal nocturnal dyspnea.  She is consistent in using her oxygen nocturnally.  Voices no other complaint.  Review of Systems A 10 point review of systems was performed and it is as noted above otherwise negative.    Objective:   Physical Exam BP (!) 148/82 (BP Location: Left Arm, Cuff Size: Normal)   Pulse 86   Temp (!) 97.2 F (36.2 C) (Temporal)   Ht 5\' 5"  (1.651 m)   Wt 162 lb 9.6 oz (73.8 kg)   SpO2 96%   BMI 27.06 kg/m   GENERAL: Awake,alert, anxious demeanor, fully ambulatory HEAD: Normocephalic, atraumatic.  EYES: Pupils equal, round, reactive to light.  No scleral icterus.  MOUTH: Nose/mouth/throat not examined due to masking  requirements for COVID 19. NECK: Supple. No thyromegaly. No nodules. No JVD.  No tracheal deviation. PULMONARY: No respiratory distress, clear to auscultation bilaterally, no adventitious sounds. CARDIOVASCULAR: S1 and S2. Regular rate and rhythm.  No rubs murmurs or gallops heard. GASTROINTESTINAL: No distention noted MUSCULOSKELETAL: RA changes on both hands, no clubbing, no edema.  NEUROLOGIC: No focal deficits, fluent speech. SKIN: Intact,warm,dry.  Limited exam shows no rashes PSYCH: Mood and behavior appropriate.    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  We will call with the results of the echocardiogram when done.   Renold Don, MD [facility]   *  This note was dictated using voice recognition software/Dragon.  Despite best efforts to proofread, errors can occur which can change the meaning.  Any change w as purely unintentional.

## 2019-12-03 NOTE — Patient Instructions (Addendum)
1.  We will see you in follow-up in 4 to 6 weeks time.  These call sooner should any new difficulties arise.  2.  We will let you know what your echocardiogram shows.  3.  Continue follow-up with cardiology as you have planned.  4.  We have switch your Trelegy to Anoro to see if this helps with the thrush issues please let us know if this is working for you.  Do not use Trelegy while using Anoro.  5.  Take Pepcid 20 mg at bedtime.  You can find this over-the-counter.  The generic name is famotidine you can get either brand name or generic name.

## 2019-12-12 ENCOUNTER — Other Ambulatory Visit: Payer: Self-pay

## 2019-12-12 ENCOUNTER — Emergency Department
Admission: EM | Admit: 2019-12-12 | Discharge: 2019-12-12 | Disposition: A | Discharge status: Home / Self Care | Payer: Medicare Other | Attending: Emergency Medicine | Admitting: Emergency Medicine

## 2019-12-12 ENCOUNTER — Emergency Department: Payer: Medicare Other

## 2019-12-12 DIAGNOSIS — Z7722 Contact with and (suspected) exposure to environmental tobacco smoke (acute) (chronic): Secondary | ICD-10-CM | POA: Diagnosis not present

## 2019-12-12 DIAGNOSIS — J449 Chronic obstructive pulmonary disease, unspecified: Secondary | ICD-10-CM | POA: Insufficient documentation

## 2019-12-12 DIAGNOSIS — U071 COVID-19: Secondary | ICD-10-CM | POA: Diagnosis not present

## 2019-12-12 DIAGNOSIS — J45909 Unspecified asthma, uncomplicated: Secondary | ICD-10-CM | POA: Diagnosis not present

## 2019-12-12 DIAGNOSIS — E119 Type 2 diabetes mellitus without complications: Secondary | ICD-10-CM | POA: Diagnosis not present

## 2019-12-12 DIAGNOSIS — J189 Pneumonia, unspecified organism: Secondary | ICD-10-CM | POA: Insufficient documentation

## 2019-12-12 DIAGNOSIS — R05 Cough: Secondary | ICD-10-CM | POA: Diagnosis present

## 2019-12-12 DIAGNOSIS — I10 Essential (primary) hypertension: Secondary | ICD-10-CM | POA: Diagnosis not present

## 2019-12-12 LAB — COMPREHENSIVE METABOLIC PANEL
ALT: 20 U/L (ref 0–44)
AST: 20 U/L (ref 15–41)
Albumin: 3.5 g/dL (ref 3.5–5.0)
Alkaline Phosphatase: 87 U/L (ref 38–126)
Anion gap: 13 (ref 5–15)
BUN: 11 mg/dL (ref 8–23)
CO2: 23 mmol/L (ref 22–32)
Calcium: 8.8 mg/dL — ABNORMAL LOW (ref 8.9–10.3)
Chloride: 99 mmol/L (ref 98–111)
Creatinine, Ser: 1.08 mg/dL — ABNORMAL HIGH (ref 0.44–1.00)
GFR calc Af Amer: >60 mL/min (ref >60)
GFR calc non Af Amer: 53 mL/min — ABNORMAL LOW (ref >60)
Glucose, Bld: 163 mg/dL — ABNORMAL HIGH (ref 70–99)
Potassium: 3.6 mmol/L (ref 3.5–5.1)
Sodium: 135 mmol/L (ref 135–145)
Total Bilirubin: 1.2 mg/dL (ref 0.3–1.2)
Total Protein: 7 g/dL (ref 6.5–8.1)

## 2019-12-12 LAB — CBC WITH DIFFERENTIAL/PLATELET
Abs Immature Granulocytes: 0.07 10*3/uL (ref 0.00–0.07)
Basophils Absolute: 0.1 10*3/uL (ref 0.0–0.1)
Basophils Relative: 1 %
Eosinophils Absolute: 0.1 10*3/uL (ref 0.0–0.5)
Eosinophils Relative: 1 %
HCT: 35.4 % — ABNORMAL LOW (ref 36.0–46.0)
Hemoglobin: 11.7 g/dL — ABNORMAL LOW (ref 12.0–15.0)
Immature Granulocytes: 1 %
Lymphocytes Relative: 8 %
Lymphs Abs: 0.7 10*3/uL (ref 0.7–4.0)
MCH: 30.8 pg (ref 26.0–34.0)
MCHC: 33.1 g/dL (ref 30.0–36.0)
MCV: 93.2 fL (ref 80.0–100.0)
Monocytes Absolute: 0.7 10*3/uL (ref 0.1–1.0)
Monocytes Relative: 8 %
Neutro Abs: 7.1 10*3/uL (ref 1.7–7.7)
Neutrophils Relative %: 81 %
Platelets: 244 10*3/uL (ref 150–400)
RBC: 3.8 MIL/uL — ABNORMAL LOW (ref 3.87–5.11)
RDW: 14.7 % (ref 11.5–15.5)
WBC: 8.6 10*3/uL (ref 4.0–10.5)
nRBC: 0 % (ref 0.0–0.2)

## 2019-12-12 LAB — RESPIRATORY PANEL BY RT PCR (FLU A&B, COVID)
Influenza A by PCR: NEGATIVE
Influenza B by PCR: NEGATIVE
SARS Coronavirus 2 by RT PCR: POSITIVE — AB

## 2019-12-12 MED ORDER — ACETAMINOPHEN 325 MG PO TABS
650.0000 mg | ORAL_TABLET | Freq: Once | ORAL | Status: AC | PRN
  Administered 2019-12-12: 650 mg via ORAL
  Filled 2019-12-12: qty 2

## 2019-12-12 MED ORDER — LEVOFLOXACIN IN D5W 750 MG/150ML IV SOLN: 750.0000 mg | Freq: Once | INTRAVENOUS | Status: DC

## 2019-12-12 MED ORDER — METHYLPREDNISOLONE SODIUM SUCC 125 MG IJ SOLR
125.0000 mg | Freq: Once | INTRAMUSCULAR | Status: AC
  Administered 2019-12-12: 14:00:00 125 mg via INTRAVENOUS
  Filled 2019-12-12: qty 2

## 2019-12-12 MED ORDER — DOXYCYCLINE HYCLATE 100 MG PO CAPS: 100.0000 mg | ORAL_CAPSULE | Freq: Two times a day (BID) | ORAL | 0 refills | Status: DC

## 2019-12-12 MED ORDER — SODIUM CHLORIDE 0.9 % IV SOLN
100.0000 mg | Freq: Once | INTRAVENOUS | Status: AC
  Administered 2019-12-12: 100 mg via INTRAVENOUS
  Filled 2019-12-12: qty 100

## 2019-12-12 MED ORDER — IPRATROPIUM-ALBUTEROL 0.5-2.5 (3) MG/3ML IN SOLN
3.0000 mL | Freq: Once | RESPIRATORY_TRACT | Status: AC
  Administered 2019-12-12: 3 mL via RESPIRATORY_TRACT
  Filled 2019-12-12: qty 3

## 2019-12-12 MED ORDER — PREDNISONE 10 MG (21) PO TBPK: ORAL_TABLET | ORAL | 0 refills | Status: DC

## 2019-12-12 NOTE — ED Provider Notes (Addendum)
[facility] Emergency Department Provider Note       Time seen: ----------------------------------------- 1:24 PM on 12/12/2019 -----------------------------------------   I have reviewed the triage vital signs and the nursing notes.  HISTORY   Chief Complaint URI    HPI Misty Briggs is a 69 y.o. female with a history of arthritis, asthma, COPD, diabetes, hyperlipidemia, hypertension, MI, rheumatoid arthritis who presents to the ED for cough, congestion with fever since yesterday.  Patient states she was diagnosed with Covid in early November and it has not been 90 days yet since her last infection.  She was admitted into the hospital for that time.  She had a CT of her chest done 3 weeks ago which was negative for PE.  Acutely she began having fever and worsening cough today.  Past Medical History:  Diagnosis Date  . Arthritis   . Asthma   . COPD (chronic obstructive pulmonary disease) (HCC)    Dr. Raul Del, [facility] pulmonologist  . Diabetes mellitus   . GERD (gastroesophageal reflux disease)   . Heart murmur   . HLD (hyperlipidemia)   . Hypertension   . Myocardial infarction (HCC)    mild  . PONV (postoperative nausea and vomiting)    itching sometimes  . Rheumatoid arthritis(714.0)   . Shortness of breath     Patient Active Problem List   Diagnosis Date Noted  . Acute respiratory failure with hypoxia ([place]) 09/26/2019  . Pneumonia due to COVID-19 virus 09/26/2019  . COPD exacerbation ([place]) 11/17/2018  . Asthma without status asthmaticus 09/04/2015  . Arthritis, degenerative 09/04/2015  . Essential (primary) hypertension 07/01/2015  . Gastro-esophageal reflux disease without esophagitis 07/01/2015  . Combined fat and carbohydrate induced hyperlipemia 07/01/2015  . Rheumatoid arthritis involving multiple joints ([place]) 09/10/2014    Past Surgical History:  Procedure Laterality Date  . ANTERIOR CERVICAL DECOMP/DISCECTOMY  FUSION  05/05/2012   Procedure: ANTERIOR CERVICAL DECOMPRESSION/DISCECTOMY FUSION 1 LEVEL;  Surgeon: Floyce Stakes, MD;  Location: [facility] NEURO ORS;  Service: Neurosurgery;  Laterality: N/A;  Cervical six seven Anterior cervical decompression/diskectomy, Fusion, Plate  . COLONOSCOPY WITH PROPOFOL N/A 09/08/2017   Procedure: COLONOSCOPY WITH PROPOFOL;  Surgeon: Lollie Sails, MD;  Location: [facility] ENDOSCOPY;  Service: Endoscopy;  Laterality: N/A;  . ENDOMETRIAL ABLATION     novasure  . FOOT SURGERY  2015  . ROTATOR CUFF REPAIR     bilat  . TRANSTHORACIC ECHOCARDIOGRAM  2010   [facility]  . TUBAL LIGATION      Allergies Penicillins, Azithromycin, Levofloxacin, and Sulfa antibiotics  Social History Social History   Tobacco Use  . Smoking status: Passive Smoke Exposure - Never Smoker  . Smokeless tobacco: Never Used  Substance Use Topics  . Alcohol use: Not Currently    Alcohol/week: 0.0 standard drinks  . Drug use: No   Review of Systems Constitutional: Positive for fever Cardiovascular: Negative for chest pain. Respiratory: Positive for coughing shortness of breath Gastrointestinal: Negative for abdominal pain, vomiting and diarrhea. Musculoskeletal: Negative for back pain. Skin: Negative for rash. Neurological: Negative for headaches, focal weakness or numbness.  All systems negative/normal/unremarkable except as stated in the HPI  ____________________________________________   PHYSICAL EXAM:  VITAL SIGNS: ED Triage Vitals  Enc Vitals Group     BP 12/12/19 1214 (!) 177/90     Pulse Rate 12/12/19 1214 (!) 120     Resp 12/12/19 1214 18     Temp 12/12/19 1214 (!) 100.8 F (38.2  C)     Temp Source 12/12/19 1214 Oral     SpO2 12/12/19 1214 96 %     Weight 12/12/19 1215 162 lb 9.6 oz (73.8 kg)     Height 12/12/19 1215 5\' 5"  (1.651 m)     Head Circumference --      Peak Flow --      Pain Score 12/12/19 1214 5     Pain Loc --      Pain Edu? --       Excl. in GC? --    Constitutional: Alert and oriented.  Mild distress Eyes: Conjunctivae are normal. Normal extraocular movements. ENT      Head: Normocephalic and atraumatic.      Nose: No congestion/rhinnorhea.      Mouth/Throat: Mucous membranes are moist.      Neck: No stridor. Cardiovascular: Rapid rate, regular rhythm. No murmurs, rubs, or gallops. Respiratory: Mild wheezing bilaterally Gastrointestinal: Soft and nontender. Normal bowel sounds Musculoskeletal: Nontender with normal range of motion in extremities. No lower extremity tenderness nor edema. Neurologic:  Normal speech and language. No gross focal neurologic deficits are appreciated.  Skin:  Skin is warm, dry and intact. No rash noted. Psychiatric: Mood and affect are normal. Speech and behavior are normal.  ____________________________________________  ED COURSE:  As part of my medical decision making, I reviewed the following data within the electronic MEDICAL RECORD NUMBER History obtained from family if available, nursing notes, old chart and ekg, as well as notes from prior ED visits. Patient presented for fever and cough, we will assess with labs and imaging as indicated at this time.   Procedures  CAPRISHA BRIDGETT was evaluated in Emergency Department on 12/12/2019 for the symptoms described in the history of present illness. She was evaluated in the context of the global COVID-19 pandemic, which necessitated consideration that the patient might be at risk for infection with the SARS-CoV-2 virus that causes COVID-19. Institutional protocols and algorithms that pertain to the evaluation of patients at risk for COVID-19 are in a state of rapid change based on information released by regulatory bodies including the CDC and federal and state organizations. These policies and algorithms were followed during the patient's care in the ED.  ____________________________________________   LABS (pertinent positives/negatives)  Labs  Reviewed  CBC WITH DIFFERENTIAL/PLATELET - Abnormal; Notable for the following components:      Result Value   RBC 3.80 (*)    Hemoglobin 11.7 (*)    HCT 35.4 (*)    All other components within normal limits  COMPREHENSIVE METABOLIC PANEL - Abnormal; Notable for the following components:   Glucose, Bld 163 (*)    Creatinine, Ser 1.08 (*)    Calcium 8.8 (*)    GFR calc non Af Amer 53 (*)    All other components within normal limits  RESPIRATORY PANEL BY RT PCR (FLU A&B, COVID)    RADIOLOGY Images were viewed by me  Chest x-ray IMPRESSION: Mild patchy hazy and reticular opacities throughout both lungs, similar to slightly increased. Findings are indeterminate for recurrent atypical/viral pneumonia versus postinflammatory fibrosis. Continued chest radiograph follow-up suggested. A follow-up high-resolution chest CT study could be considered in 3-6 months as clinically warranted. ____________________________________________   DIFFERENTIAL DIAGNOSIS   COVID-19, pneumonia, PE, pneumothorax, URI, bronchitis  FINAL ASSESSMENT AND PLAN  Community-acquired pneumonia   Plan: The patient had presented for fever and cough in the setting of Covid infection within the last 90 days. Patient's labs were overall  reassuring. Patient's imaging revealed mild hazy and reticular opacities throughout both lungs slightly increased from her prior Covid x-rays and recent chest CT.  In light of her acute febrile illness we have tried steroids and I will add doxycycline onto her course of care.  Patient is still testing positive for Covid but flu test were negative.  I discussed with her pulmonologist and we will encourage close outpatient follow-up.   Ulice Dash, MD    Note: This note was generated in part or whole with voice recognition software. Voice recognition is usually quite accurate but there are transcription errors that can and very often do occur. I apologize for any  typographical errors that were not detected and corrected.     Emily Filbert, MD 12/12/19 1440    Emily Filbert, MD 12/12/19 [phone]

## 2019-12-12 NOTE — ED Triage Notes (Signed)
Pt c/o cough congestion with a fever since yesterday , pt states she was dx with covid 11/3 and was admitted. Pt is in NAD at present.

## 2019-12-13 ENCOUNTER — Other Ambulatory Visit: Payer: PRIVATE HEALTH INSURANCE

## 2019-12-14 ENCOUNTER — Telehealth: Payer: Self-pay | Admitting: Pulmonary Disease

## 2019-12-14 MED ORDER — ANORO ELLIPTA 62.5-25 MCG/INH IN AEPB: 1.0000 | INHALATION_SPRAY | Freq: Every day | RESPIRATORY_TRACT | 5 refills | Status: DC

## 2019-12-14 NOTE — Telephone Encounter (Signed)
If her shortness of breath is worsening, given recurrent Covid, she will need to be seen in the ED again to make sure she does not need admission and oxygen therapy.

## 2019-12-14 NOTE — Telephone Encounter (Signed)
Unfortunately she has tested positive for Covid again.  She could not come to the follow-up appointment on the 27th because she needs to wait another 21 days from that time and she needs to be asymptomatic for follow-up.  Anoro is likely not helping because of the Covid issue.  This is a scarring that develops after Covid and it will take time there is no medication currently that can help with this.  We can prescribe an albuterol inhaler that she can use as needed.  We cannot prescribe nebulizers because it are aerosolizes into the room and she can cause infection to those around her.

## 2019-12-14 NOTE — Telephone Encounter (Signed)
Called and spoke to pt.  Pt stated that she had ED visit on 12/12/2019. Pt stated that during ED visit, she was given doxy and prednisone.  Pt is questioning if Anoro is strong enough for her. Pt has not noticed much of a difference with breathing since starting Anoro sample.    LG, please advise. Thanks

## 2019-12-14 NOTE — Telephone Encounter (Signed)
Pt is aware of below message/recommendations and voiced her understanding.  Pt stated that she already has a ventolin inhaler and she will start using it as needed.  HFU for 12/21/2019 has been canceled. Pt has pending appt for 01/08/2020.  Pt will go to ED if breathing worsens.  Rx for anoro has been sent to preferred pharmacy per pt request. Advised pt to quarantine x10 days from positive test.   Nothing further is needed.

## 2019-12-17 ENCOUNTER — Telehealth: Payer: Self-pay | Admitting: Pulmonary Disease

## 2019-12-17 MED ORDER — STIOLTO RESPIMAT 2.5-2.5 MCG/ACT IN AERS: 2.0000 | INHALATION_SPRAY | Freq: Every day | RESPIRATORY_TRACT | 5 refills | Status: DC

## 2019-12-17 NOTE — Telephone Encounter (Signed)
She recently tested positive for COVID-19 and this is likely REAL.  Her symptoms have been residual from her first infection and now worsened by the second.  She can discontinue doxycycline.  Continue prednisone until completed.  Unfortunately there is nothing available for post COVID-19 symptoms just has to run its course.  This takes longer on some patients.

## 2019-12-17 NOTE — Telephone Encounter (Signed)
Spoke to pt, who stated that [employer] will be faxing over a covered alternative list for Anoro.  Pt stated that she has developed blisters in her mouth and on her lip since starting doxycyline. Pt has 2 days left of doxycyline and one day left of prednisone. Pt is questioning what her next steps will be. She is questioning if she will need another course of abx or prednisone to treat the covid PNA?  Dr. Jayme Cloud, please advise.

## 2019-12-17 NOTE — Telephone Encounter (Signed)
Pt called and said [employer] was suppose to fax [place] a list of inhalers that are covered by her insurance, also pt wants advice on what to do after she finishes her antibiotic. she has covid for a 2nd time. The antibiotic she is currently taking is causing blisters in her mouth, she states she is allergic to a lot of medications. Cb#: [phone]

## 2019-12-17 NOTE — Telephone Encounter (Addendum)
Covered alternatives for anoro are stiolto or bevespi.   Dr. Jayme Cloud, please advise. Thanks

## 2019-12-17 NOTE — Telephone Encounter (Signed)
Pt is aware of Dr. Georgann Housekeeper recommendations and voiced her understanding.  Per Dr. Jayme Cloud verbally- okay to switch anoro to [employer]. Rx for Stiolto has been sent to preferred pharmacy.  Noting further is needed.

## 2019-12-17 NOTE — Telephone Encounter (Signed)
Lm for pt

## 2019-12-21 ENCOUNTER — Inpatient Hospital Stay: Payer: PRIVATE HEALTH INSURANCE | Admitting: Pulmonary Disease

## 2019-12-26 ENCOUNTER — Other Ambulatory Visit: Payer: PRIVATE HEALTH INSURANCE

## 2020-01-01 ENCOUNTER — Telehealth: Payer: Self-pay

## 2020-01-01 NOTE — Telephone Encounter (Signed)
We received a PA for anoro on this pt and I submitted a PA via covermymeds and received a denial. With further investigation of patients chart she was later switched from Anoro to [employer] as the anoro was causing her issues.

## 2020-01-08 ENCOUNTER — Ambulatory Visit (INDEPENDENT_AMBULATORY_CARE_PROVIDER_SITE_OTHER): Payer: Medicare Other | Admitting: Pulmonary Disease

## 2020-01-08 ENCOUNTER — Encounter: Payer: Self-pay | Admitting: Pulmonary Disease

## 2020-01-08 ENCOUNTER — Other Ambulatory Visit: Payer: Self-pay

## 2020-01-08 ENCOUNTER — Telehealth: Payer: Self-pay | Admitting: Pulmonary Disease

## 2020-01-08 VITALS — BP 146/98 | HR 97 | Temp 99.6°F | Ht 65.0 in | Wt 162.0 lb

## 2020-01-08 DIAGNOSIS — U071 COVID-19: Secondary | ICD-10-CM

## 2020-01-08 DIAGNOSIS — M069 Rheumatoid arthritis, unspecified: Secondary | ICD-10-CM | POA: Diagnosis not present

## 2020-01-08 DIAGNOSIS — J449 Chronic obstructive pulmonary disease, unspecified: Secondary | ICD-10-CM

## 2020-01-08 DIAGNOSIS — J984 Other disorders of lung: Secondary | ICD-10-CM | POA: Diagnosis not present

## 2020-01-08 MED ORDER — QVAR REDIHALER 80 MCG/ACT IN AERB: 2.0000 | INHALATION_SPRAY | Freq: Two times a day (BID) | RESPIRATORY_TRACT | 6 refills | Status: DC

## 2020-01-08 NOTE — Progress Notes (Signed)
Subjective:    Patient ID: Misty Briggs, female    DOB: [birth year], 69 y.o.   MRN: [id number]  HPI 69 year old lifelong never smoker (significant secondhand smoke) with a history of asthma and rheumatoid arthritis first evaluated here on 20 November 2019 after COVID-19 infection.  She recurred in symptoms on 12 December 2019 was retested and was noted to be Covid positive again.  Chest x-ray at that time was consistent with recurrent atypical/viral pneumonia versus postinflammatory fibrosis.  She was in quarantine for over 14 days.  She was not able to get 2D echo performed which had been requested for evaluation of her persistent dyspnea.  She did not get an Anoro Ellipta which was prescribed for her during her last visit insurance would not cover it so this was switched to [employer] but she never got the prescription.  Our records indicate that the prescription is in the pharmacy.  She saw her rheumatologist Dr. Gavin Potters on 11 February he manages her rheumatoid arthritis, he has discontinued methotrexate and folic acid as well as etodolac.  He has resumed hydroxychloroquine.  He is withholding methotrexate onto her pulmonary issues are "sorted out".  She has not had any recent fevers, chills or sweats since her positive test on 20 January.  Dyspnea is actually somewhat better.  She had not had issues with cough but recently has started to have a dry cough again.  She voices no other complaint, she is to have 2D echo on 18 February.  This had to be postponed due to her testing positive for Covid again.   Review of Systems A 10 point review of systems was performed and it is as noted above otherwise negative.    Objective:   Physical Exam BP (!) 146/98 (BP Location: Left Arm, Patient Position: Sitting, Cuff Size: Large)   Pulse 97   Temp 99.6 F (37.6 C) (Temporal)   Ht 5\' 5"  (1.651 m)   Wt 162 lb (73.5 kg)   SpO2 97% Comment: on ra  BMI 26.96 kg/m   GENERAL: Awake,alert, anxious demeanor,  fully ambulatory HEAD: Normocephalic, atraumatic.  EYES: Pupils equal, round, reactive to light. No scleral icterus.  MOUTH: Nose/mouth/throat not examined due to masking requirements for COVID 19. NECK: Supple. No thyromegaly. No nodules. No JVD. No tracheal deviation. PULMONARY: No respiratory distress, occasional wheezing to auscultation bilaterally,no other adventitious sounds. CARDIOVASCULAR: S1 and S2. Regular rate and rhythm.  No rubs murmurs or gallops heard. GASTROINTESTINAL: No distention noted MUSCULOSKELETAL: RA changes on both hands, no clubbing, no edema.  NEUROLOGIC: No focal deficits, fluent speech. SKIN: Intact,warm,dry. Limited exam shows no rashes PSYCH: Mood and behavior appropriate.      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   Gastroesophageal reflux Symptoms improved Continue antireflux measures Continue omeprazole and as needed Pepcid at bedtime  Follow-up will be in a month's time she is to contact us prior to that time should any new difficulties arise.   Renold Don, MD [facility]   *This note was dictated using voice recognition software/Dragon.   Despite best efforts to proofread, errors can occur which can change the meaning.  Any change was purely unintentional.

## 2020-01-08 NOTE — Patient Instructions (Signed)
The replacement for Anoro was sent to your pharmacy it is because Stiolto will be 2 inhalations daily prescription is there with 5 refills left.  We have sent a prescription for Qvar which is an inhaled steroid and its 2 puffs twice a day make sure you rinse your mouth well after use it that should be gentler than the 1 in Trelegy.  If your insurance does not cover it please ask them for alternatives and we will try to get that sent in for you  We will see you in follow-up in a month's time.

## 2020-01-08 NOTE — Telephone Encounter (Signed)
PA has been started for Qvar redihaler via CMM. Will await determination.   AYG:[id number]

## 2020-01-09 NOTE — Telephone Encounter (Addendum)
Pt is aware of Qvar approval and voiced her understanding. Pt requested that I contact [employer] in regards to Stiolto, as she was told yesterday that they did not receive Rx. Spoke to [place] with [employer] and made her aware of approval and confirmed that Rx for stiolto was in fact received.  Pt has been made aware of this information and voiced her understanding.  Nothing further is needed.

## 2020-01-09 NOTE — Telephone Encounter (Signed)
Patient checking status of the Qvar and Stiolto Respimat inhalers at the pharmacy.  Pharmacy is [facility]. Phone number is [phone]. Patient phone number is [phone].

## 2020-01-09 NOTE — Telephone Encounter (Addendum)
Received approval from [facility] for qvar until 10/2020. ATC [employer] to make aware of approval- line rang x2 and then gave bust dial.  Lm for pt.

## 2020-01-10 ENCOUNTER — Other Ambulatory Visit: Payer: PRIVATE HEALTH INSURANCE

## 2020-01-23 ENCOUNTER — Other Ambulatory Visit: Payer: Self-pay

## 2020-01-23 ENCOUNTER — Ambulatory Visit (INDEPENDENT_AMBULATORY_CARE_PROVIDER_SITE_OTHER): Payer: Medicare Other

## 2020-01-23 DIAGNOSIS — R0602 Shortness of breath: Secondary | ICD-10-CM | POA: Diagnosis not present

## 2020-01-28 ENCOUNTER — Ambulatory Visit: Payer: Medicare Other | Admitting: Pulmonary Disease

## 2020-02-06 ENCOUNTER — Other Ambulatory Visit: Payer: Self-pay | Admitting: Family Medicine

## 2020-02-06 DIAGNOSIS — Z1231 Encounter for screening mammogram for malignant neoplasm of breast: Secondary | ICD-10-CM

## 2020-02-14 ENCOUNTER — Ambulatory Visit: Payer: PRIVATE HEALTH INSURANCE | Admitting: Pulmonary Disease

## 2020-02-21 ENCOUNTER — Ambulatory Visit
Admission: RE | Admit: 2020-02-21 | Discharge: 2020-02-21 | Disposition: A | Discharge status: Home / Self Care | Payer: Medicare Other | Source: Ambulatory Visit | Attending: Family Medicine | Admitting: Family Medicine

## 2020-02-21 DIAGNOSIS — Z1231 Encounter for screening mammogram for malignant neoplasm of breast: Secondary | ICD-10-CM | POA: Insufficient documentation

## 2020-02-28 ENCOUNTER — Emergency Department: Payer: Medicare Other

## 2020-02-28 ENCOUNTER — Encounter: Payer: Self-pay | Admitting: Emergency Medicine

## 2020-02-28 ENCOUNTER — Inpatient Hospital Stay
Admission: EM | Admit: 2020-02-28 | Discharge: 2020-03-06 | DRG: 871 | Disposition: A | Discharge status: Home Health | Payer: Medicare Other | Attending: Student | Admitting: Student

## 2020-02-28 ENCOUNTER — Other Ambulatory Visit: Payer: Self-pay

## 2020-02-28 DIAGNOSIS — Z882 Allergy status to sulfonamides status: Secondary | ICD-10-CM

## 2020-02-28 DIAGNOSIS — J4530 Mild persistent asthma, uncomplicated: Secondary | ICD-10-CM | POA: Diagnosis not present

## 2020-02-28 DIAGNOSIS — K219 Gastro-esophageal reflux disease without esophagitis: Secondary | ICD-10-CM | POA: Diagnosis present

## 2020-02-28 DIAGNOSIS — M549 Dorsalgia, unspecified: Secondary | ICD-10-CM | POA: Diagnosis present

## 2020-02-28 DIAGNOSIS — E785 Hyperlipidemia, unspecified: Secondary | ICD-10-CM | POA: Diagnosis present

## 2020-02-28 DIAGNOSIS — D849 Immunodeficiency, unspecified: Secondary | ICD-10-CM | POA: Diagnosis present

## 2020-02-28 DIAGNOSIS — E871 Hypo-osmolality and hyponatremia: Secondary | ICD-10-CM | POA: Diagnosis present

## 2020-02-28 DIAGNOSIS — G8929 Other chronic pain: Secondary | ICD-10-CM | POA: Diagnosis present

## 2020-02-28 DIAGNOSIS — D649 Anemia, unspecified: Secondary | ICD-10-CM | POA: Diagnosis present

## 2020-02-28 DIAGNOSIS — I10 Essential (primary) hypertension: Secondary | ICD-10-CM | POA: Diagnosis present

## 2020-02-28 DIAGNOSIS — R7989 Other specified abnormal findings of blood chemistry: Secondary | ICD-10-CM | POA: Diagnosis not present

## 2020-02-28 DIAGNOSIS — W57XXXA Bitten or stung by nonvenomous insect and other nonvenomous arthropods, initial encounter: Secondary | ICD-10-CM | POA: Diagnosis present

## 2020-02-28 DIAGNOSIS — D761 Hemophagocytic lymphohistiocytosis: Secondary | ICD-10-CM | POA: Diagnosis present

## 2020-02-28 DIAGNOSIS — R748 Abnormal levels of other serum enzymes: Secondary | ICD-10-CM | POA: Diagnosis not present

## 2020-02-28 DIAGNOSIS — Z981 Arthrodesis status: Secondary | ICD-10-CM | POA: Diagnosis not present

## 2020-02-28 DIAGNOSIS — R6520 Severe sepsis without septic shock: Secondary | ICD-10-CM | POA: Diagnosis not present

## 2020-02-28 DIAGNOSIS — M436 Torticollis: Secondary | ICD-10-CM | POA: Diagnosis present

## 2020-02-28 DIAGNOSIS — N179 Acute kidney failure, unspecified: Secondary | ICD-10-CM

## 2020-02-28 DIAGNOSIS — I2510 Atherosclerotic heart disease of native coronary artery without angina pectoris: Secondary | ICD-10-CM | POA: Diagnosis present

## 2020-02-28 DIAGNOSIS — Z20822 Contact with and (suspected) exposure to covid-19: Secondary | ICD-10-CM | POA: Diagnosis present

## 2020-02-28 DIAGNOSIS — I252 Old myocardial infarction: Secondary | ICD-10-CM | POA: Diagnosis not present

## 2020-02-28 DIAGNOSIS — R509 Fever, unspecified: Secondary | ICD-10-CM

## 2020-02-28 DIAGNOSIS — Z7982 Long term (current) use of aspirin: Secondary | ICD-10-CM

## 2020-02-28 DIAGNOSIS — R011 Cardiac murmur, unspecified: Secondary | ICD-10-CM | POA: Diagnosis present

## 2020-02-28 DIAGNOSIS — Z880 Allergy status to penicillin: Secondary | ICD-10-CM

## 2020-02-28 DIAGNOSIS — Z8616 Personal history of COVID-19: Secondary | ICD-10-CM

## 2020-02-28 DIAGNOSIS — N170 Acute kidney failure with tubular necrosis: Secondary | ICD-10-CM | POA: Diagnosis present

## 2020-02-28 DIAGNOSIS — Z9181 History of falling: Secondary | ICD-10-CM

## 2020-02-28 DIAGNOSIS — B370 Candidal stomatitis: Secondary | ICD-10-CM | POA: Diagnosis present

## 2020-02-28 DIAGNOSIS — Z79899 Other long term (current) drug therapy: Secondary | ICD-10-CM

## 2020-02-28 DIAGNOSIS — E119 Type 2 diabetes mellitus without complications: Secondary | ICD-10-CM | POA: Diagnosis present

## 2020-02-28 DIAGNOSIS — A419 Sepsis, unspecified organism: Principal | ICD-10-CM | POA: Diagnosis present

## 2020-02-28 DIAGNOSIS — Z7984 Long term (current) use of oral hypoglycemic drugs: Secondary | ICD-10-CM

## 2020-02-28 DIAGNOSIS — R7401 Elevation of levels of liver transaminase levels: Secondary | ICD-10-CM | POA: Diagnosis not present

## 2020-02-28 DIAGNOSIS — J449 Chronic obstructive pulmonary disease, unspecified: Secondary | ICD-10-CM | POA: Diagnosis present

## 2020-02-28 DIAGNOSIS — E878 Other disorders of electrolyte and fluid balance, not elsewhere classified: Secondary | ICD-10-CM | POA: Diagnosis present

## 2020-02-28 DIAGNOSIS — Z7722 Contact with and (suspected) exposure to environmental tobacco smoke (acute) (chronic): Secondary | ICD-10-CM | POA: Diagnosis present

## 2020-02-28 DIAGNOSIS — I9589 Other hypotension: Secondary | ICD-10-CM

## 2020-02-28 DIAGNOSIS — R519 Headache, unspecified: Secondary | ICD-10-CM | POA: Diagnosis not present

## 2020-02-28 DIAGNOSIS — R809 Proteinuria, unspecified: Secondary | ICD-10-CM | POA: Diagnosis present

## 2020-02-28 DIAGNOSIS — M069 Rheumatoid arthritis, unspecified: Secondary | ICD-10-CM | POA: Diagnosis present

## 2020-02-28 DIAGNOSIS — I959 Hypotension, unspecified: Secondary | ICD-10-CM

## 2020-02-28 DIAGNOSIS — J45909 Unspecified asthma, uncomplicated: Secondary | ICD-10-CM | POA: Diagnosis present

## 2020-02-28 DIAGNOSIS — R0603 Acute respiratory distress: Secondary | ICD-10-CM | POA: Diagnosis not present

## 2020-02-28 DIAGNOSIS — Z881 Allergy status to other antibiotic agents status: Secondary | ICD-10-CM

## 2020-02-28 DIAGNOSIS — R531 Weakness: Secondary | ICD-10-CM

## 2020-02-28 DIAGNOSIS — R42 Dizziness and giddiness: Secondary | ICD-10-CM | POA: Diagnosis not present

## 2020-02-28 DIAGNOSIS — Z8701 Personal history of pneumonia (recurrent): Secondary | ICD-10-CM

## 2020-02-28 HISTORY — DX: Fever, unspecified: R50.9

## 2020-02-28 LAB — CSF CELL COUNT WITH DIFFERENTIAL
RBC Count, CSF: 2 /mm3 — ABNORMAL HIGH
RBC Count, CSF: 2 /mm3 — ABNORMAL HIGH
Tube #: 1
Tube #: 4
WBC, CSF: 0 /mm3 (ref 0–5)
WBC, CSF: 0 /mm3 (ref 0–5)

## 2020-02-28 LAB — RESPIRATORY PANEL BY RT PCR (FLU A&B, COVID)
Influenza A by PCR: NEGATIVE
Influenza B by PCR: NEGATIVE
SARS Coronavirus 2 by RT PCR: NEGATIVE

## 2020-02-28 LAB — CBC
HCT: 39.1 % (ref 36.0–46.0)
Hemoglobin: 12.9 g/dL (ref 12.0–15.0)
MCH: 29.5 pg (ref 26.0–34.0)
MCHC: 33 g/dL (ref 30.0–36.0)
MCV: 89.3 fL (ref 80.0–100.0)
Platelets: 268 10*3/uL (ref 150–400)
RBC: 4.38 MIL/uL (ref 3.87–5.11)
RDW: 13.2 % (ref 11.5–15.5)
WBC: 12.6 10*3/uL — ABNORMAL HIGH (ref 4.0–10.5)
nRBC: 0 % (ref 0.0–0.2)

## 2020-02-28 LAB — URINALYSIS, COMPLETE (UACMP) WITH MICROSCOPIC
Bacteria, UA: NONE SEEN
Bilirubin Urine: NEGATIVE
Glucose, UA: NEGATIVE mg/dL
Hgb urine dipstick: NEGATIVE
Ketones, ur: 5 mg/dL — AB
Nitrite: NEGATIVE
Protein, ur: 100 mg/dL — AB
Specific Gravity, Urine: 1.016 (ref 1.005–1.030)
pH: 5 (ref 5.0–8.0)

## 2020-02-28 LAB — BASIC METABOLIC PANEL
Anion gap: 11 (ref 5–15)
BUN: 14 mg/dL (ref 8–23)
CO2: 23 mmol/L (ref 22–32)
Calcium: 8.7 mg/dL — ABNORMAL LOW (ref 8.9–10.3)
Chloride: 96 mmol/L — ABNORMAL LOW (ref 98–111)
Creatinine, Ser: 1.18 mg/dL — ABNORMAL HIGH (ref 0.44–1.00)
GFR calc Af Amer: 55 mL/min — ABNORMAL LOW (ref >60)
GFR calc non Af Amer: 48 mL/min — ABNORMAL LOW (ref >60)
Glucose, Bld: 111 mg/dL — ABNORMAL HIGH (ref 70–99)
Potassium: 4.5 mmol/L (ref 3.5–5.1)
Sodium: 130 mmol/L — ABNORMAL LOW (ref 135–145)

## 2020-02-28 LAB — LACTIC ACID, PLASMA: Lactic Acid, Venous: 1.4 mmol/L (ref 0.5–1.9)

## 2020-02-28 LAB — PROTEIN AND GLUCOSE, CSF
Glucose, CSF: 74 mg/dL — ABNORMAL HIGH (ref 40–70)
Total  Protein, CSF: 32 mg/dL (ref 15–45)

## 2020-02-28 LAB — PROCALCITONIN: Procalcitonin: 0.39 ng/mL

## 2020-02-28 MED ORDER — SODIUM CHLORIDE 0.9 % IV BOLUS
1000.0000 mL | Freq: Once | INTRAVENOUS | Status: AC
  Administered 2020-02-28: 1000 mL via INTRAVENOUS

## 2020-02-28 MED ORDER — SODIUM CHLORIDE 0.9 % IV SOLN
1.0000 g | Freq: Two times a day (BID) | INTRAVENOUS | Status: DC
  Administered 2020-02-29 (×2): 1 g via INTRAVENOUS
  Filled 2020-02-28 (×3): qty 1

## 2020-02-28 MED ORDER — SODIUM CHLORIDE 0.9% FLUSH: 3.0000 mL | Freq: Once | INTRAVENOUS | Status: DC

## 2020-02-28 MED ORDER — LIDOCAINE HCL (PF) 1 % IJ SOLN
30.0000 mL | Freq: Once | INTRAMUSCULAR | Status: AC
  Administered 2020-02-28: 11:00:00 30 mL

## 2020-02-28 MED ORDER — VANCOMYCIN HCL IN DEXTROSE 1-5 GM/200ML-% IV SOLN
1000.0000 mg | Freq: Once | INTRAVENOUS | Status: AC
  Administered 2020-02-28: 18:00:00 1000 mg via INTRAVENOUS
  Filled 2020-02-28: qty 200

## 2020-02-28 MED ORDER — VANCOMYCIN HCL 500 MG/100ML IV SOLN
500.0000 mg | Freq: Once | INTRAVENOUS | Status: AC
  Administered 2020-02-28: 500 mg via INTRAVENOUS
  Filled 2020-02-28: qty 100

## 2020-02-28 MED ORDER — DEXTROSE 5 % IV SOLN
10.0000 mg/kg | Freq: Two times a day (BID) | INTRAVENOUS | Status: DC
  Administered 2020-02-29: 02:00:00 635 mg via INTRAVENOUS
  Filled 2020-02-28 (×3): qty 12.7

## 2020-02-28 MED ORDER — IPRATROPIUM-ALBUTEROL 0.5-2.5 (3) MG/3ML IN SOLN
3.0000 mL | Freq: Once | RESPIRATORY_TRACT | Status: DC
  Filled 2020-02-28: qty 3

## 2020-02-28 MED ORDER — IBUPROFEN 600 MG PO TABS
600.0000 mg | ORAL_TABLET | Freq: Once | ORAL | Status: AC
  Administered 2020-02-28: 12:00:00 600 mg via ORAL
  Filled 2020-02-28: qty 1

## 2020-02-28 MED ORDER — SODIUM CHLORIDE 0.9 % IV BOLUS
1000.0000 mL | Freq: Once | INTRAVENOUS | Status: AC
  Administered 2020-02-28: 18:00:00 1000 mL via INTRAVENOUS

## 2020-02-28 MED ORDER — SODIUM CHLORIDE 0.9 % IV SOLN
2.0000 g | Freq: Once | INTRAVENOUS | Status: AC
  Administered 2020-02-28: 2 g via INTRAVENOUS
  Filled 2020-02-28: qty 2

## 2020-02-28 NOTE — ED Notes (Signed)
Pt updated. No reports of worsening sx's at this time.

## 2020-02-28 NOTE — ED Triage Notes (Addendum)
States when she went to get up out of bed this morning, felt all wobbly and could not get up without son helping her out of bed.  Patient states she has had COVID twice, and with each time she experienced similar symptoms.  First episode was 09/24/2019, the second episode was 12/04/2019.  Patient is AAOx3.  Skin warm and dry.  NAD  650 mg tylenol taken at 0800 PTA

## 2020-02-28 NOTE — Consult Note (Signed)
PHARMACY -  BRIEF ANTIBIOTIC NOTE   Pharmacy has received consult(s) for vancomycin and aztreonam from an ED provider.  The patient's profile has been reviewed for ht/wt/allergies/indication/available labs.    One time order(s) placed for:  1) aztreonam 2 grams IV x 1 2) vancomycin 1000 mg IV x 1  Further antibiotics/pharmacy consults should be ordered by admitting physician if indicated.                       Thank you, Lowella Bandy 02/28/2020  5:54 PM

## 2020-02-28 NOTE — ED Notes (Signed)
Pt states having fevers and that she's been feeling more weak today. Pt states she could barely get out of bed today.

## 2020-02-28 NOTE — ED Provider Notes (Signed)
[facility] Emergency Department Provider Note  ____________________________________________   First MD Initiated Contact with Patient 02/28/20 1712     (approximate)  I have reviewed the triage vital signs and the nursing notes.  History  Chief Complaint Fatigue    HPI Misty Briggs is a 69 y.o. female past medical history as below who presents to the emergency department for fatigue and fever.  Patient states she has been having fevers and felt more weak since last night, states she could barely get out of bed today.  Reports history of COVID infection, initially diagnosed on 09/25/19. She states symptoms feel similar to prior COVID infection, but she does not feel as SOB this time.  She reports some nausea, no vomiting or diarrhea.  No dysuria.  No known sick contacts.  Of note, patient was retested for COVID 12/12/19 (and was positive) due to persistent symptoms, but difficult to state if this was a new infection or just a lingering resultant positive from her November infection, given it was within 90 days from her 11/3 testing.  On plaquenil.    Past Medical Hx Past Medical History:  Diagnosis Date  . Arthritis   . Asthma   . COPD (chronic obstructive pulmonary disease) (HCC)    Dr. Raul Del, [facility] pulmonologist  . Diabetes mellitus   . GERD (gastroesophageal reflux disease)   . Heart murmur   . HLD (hyperlipidemia)   . Hypertension   . Myocardial infarction (HCC)    mild  . PONV (postoperative nausea and vomiting)    itching sometimes  . Rheumatoid arthritis(714.0)   . Shortness of breath     Problem List Patient Active Problem List   Diagnosis Date Noted  . Acute respiratory failure with hypoxia ([place]) 09/26/2019  . Pneumonia due to COVID-19 virus 09/26/2019  . COPD exacerbation ([place]) 11/17/2018  . Asthma without status asthmaticus 09/04/2015  . Arthritis, degenerative 09/04/2015  . Essential (primary) hypertension  07/01/2015  . Gastro-esophageal reflux disease without esophagitis 07/01/2015  . Combined fat and carbohydrate induced hyperlipemia 07/01/2015  . Rheumatoid arthritis involving multiple joints ([place]) 09/10/2014    Past Surgical Hx Past Surgical History:  Procedure Laterality Date  . ANTERIOR CERVICAL DECOMP/DISCECTOMY FUSION  05/05/2012   Procedure: ANTERIOR CERVICAL DECOMPRESSION/DISCECTOMY FUSION 1 LEVEL;  Surgeon: Floyce Stakes, MD;  Location: [facility] NEURO ORS;  Service: Neurosurgery;  Laterality: N/A;  Cervical six seven Anterior cervical decompression/diskectomy, Fusion, Plate  . COLONOSCOPY WITH PROPOFOL N/A 09/08/2017   Procedure: COLONOSCOPY WITH PROPOFOL;  Surgeon: Lollie Sails, MD;  Location: [facility] ENDOSCOPY;  Service: Endoscopy;  Laterality: N/A;  . ENDOMETRIAL ABLATION     novasure  . FOOT SURGERY  2015  . ROTATOR CUFF REPAIR     bilat  . TRANSTHORACIC ECHOCARDIOGRAM  2010   [facility]  . TUBAL LIGATION      Medications Prior to Admission medications   Medication Sig Start Date End Date Taking? Authorizing Provider  acetaminophen (TYLENOL) 325 MG tablet Take 2 tablets (650 mg total) by mouth every 6 (six) hours as needed for mild pain or headache (fever >/= 101). 10/01/19   Elgergawy, Silver Huguenin, MD  albuterol (VENTOLIN HFA) 108 (90 Base) MCG/ACT inhaler Inhale 1-2 puffs into the lungs every 6 (six) hours as needed for wheezing or shortness of breath.    [provider]  Ascorbic Acid (VITAMIN C) 1000 MG tablet Take 1,000 mg by mouth daily.    [provider]  aspirin EC 81 MG tablet Take by mouth daily.     [provider]  beclomethasone (QVAR REDIHALER) 80 MCG/ACT inhaler Inhale 2 puffs into the lungs 2 (two) times daily. 01/08/20   Salena Saner, MD  calcium carbonate (OS-CAL - DOSED IN MG OF ELEMENTAL CALCIUM) 1250 (500 Ca) MG tablet Take 2 tablets by mouth daily with breakfast.    [provider]  Coenzyme Q10 (CO  Q-10 PO) Take by mouth daily.    [provider]  ferrous sulfate 325 (65 FE) MG tablet Take 1 tablet (325 mg total) by mouth 2 (two) times daily with a meal. 11/19/18 11/05/19  Pyreddy, Vivien Rota, MD  fluticasone (FLONASE) 50 MCG/ACT nasal spray Place 1 spray into both nostrils daily as needed for allergies or rhinitis.    [provider]  hydroxychloroquine (PLAQUENIL) 200 MG tablet Take by mouth. 01/03/20   [provider]  levocetirizine (XYZAL) 5 MG tablet Take 5 mg by mouth every evening.    [provider]  lovastatin (MEVACOR) 20 MG tablet Take 20 mg by mouth at bedtime.    [provider]  magnesium gluconate (MAGONATE) 500 MG tablet Take 500 mg by mouth daily.    [provider]  metFORMIN (GLUCOPHAGE) 1000 MG tablet Take 1 tablet (1,000 mg total) by mouth 2 (two) times daily with a meal. 11/19/18 11/05/19  Ihor Austin, MD  metoprolol tartrate (LOPRESSOR) 25 MG tablet  11/21/19   [provider]  montelukast (SINGULAIR) 10 MG tablet Take 10 mg by mouth at bedtime.    [provider]  Multiple Vitamin (MULTIVITAMIN) capsule Take 1 capsule by mouth daily.    [provider]  nystatin (MYCOSTATIN) 100000 UNIT/ML suspension Use as directed 5 mLs in the mouth or throat 4 (four) times daily. Twice daily    [provider]  omeprazole (PRILOSEC) 20 MG capsule Take 20 mg by mouth daily.    [provider]  potassium chloride (K-DUR) 10 MEQ tablet Take 1 tablet (10 mEq total) by mouth daily. 11/19/18 11/05/19  Ihor Austin, MD  telmisartan (MICARDIS) 40 MG tablet Take 40 mg by mouth daily.    [provider]  Tiotropium Bromide-Olodaterol (STIOLTO RESPIMAT) 2.5-2.5 MCG/ACT AERS Inhale 2 puffs into the lungs daily. 12/17/19   Salena Saner, MD  vitamin B-12 (CYANOCOBALAMIN) 1000 MCG tablet Take 1,000 mcg by mouth daily.    [provider]    Allergies Penicillins, Azithromycin,  Levofloxacin, and Sulfa antibiotics  Family Hx Family History  Problem Relation Age of Onset  . Breast cancer Paternal Aunt     Social Hx Social History   Tobacco Use  . Smoking status: Passive Smoke Exposure - Never Smoker  . Smokeless tobacco: Never Used  Substance Use Topics  . Alcohol use: Not Currently    Alcohol/week: 0.0 standard drinks  . Drug use: No     Review of Systems  Constitutional: Positive for fever.  Positive for fatigue. Eyes: Negative for visual changes. ENT: Negative for sore throat. Cardiovascular: Negative for chest pain. Respiratory: Negative for shortness of breath. Gastrointestinal: Negative for nausea. Negative for vomiting.  Genitourinary: Negative for dysuria. Musculoskeletal: Negative for leg swelling. Skin: Negative for rash. Neurological: Negative for headaches.   Physical Exam  Vital Signs: ED Triage Vitals  Enc Vitals Group     BP 02/28/20 1155 130/66     Pulse Rate 02/28/20 1155 (!) 115     Resp 02/28/20 1155 16  Temp 02/28/20 1155 (!) 103.2 F (39.6 C)     Temp Source 02/28/20 1155 Oral     SpO2 02/28/20 1155 97 %     Weight 02/28/20 1153 162 lb 0.6 oz (73.5 kg)     Height 02/28/20 1153 5\' 5"  (1.651 m)     Head Circumference --      Peak Flow --      Pain Score 02/28/20 1152 0     Pain Loc --      Pain Edu? --      Excl. in GC? --     Constitutional: Alert and oriented.  Appears fatigued, but in no acute distress. Head: Normocephalic. Atraumatic. Eyes: Conjunctivae clear. Sclera anicteric. Pupils equal and symmetric. Nose: No masses or lesions. No congestion or rhinorrhea. Mouth/Throat: Wearing mask.  Neck: No stridor. Trachea midline. FROM. No meningismus.  Cardiovascular: Normal rate, regular rhythm. Extremities well perfused. Respiratory: Normal respiratory effort.  Lungs CTAB. Gastrointestinal: Soft. Non-distended. Non-tender.  Genitourinary: Deferred. Musculoskeletal: No lower extremity edema. No  deformities. Neurologic:  Normal speech and language. No gross focal or lateralizing neurologic deficits are appreciated.  Skin: Skin is warm, dry and intact. No rash noted. Psychiatric: Mood and affect are appropriate for situation.  EKG  Personally reviewed and interpreted by myself.   Date: 02/28/2020 Time: 1210 Rate: 115 Rhythm: Sinus, with PACs Axis: Left Intervals: Within normal limits No STEMI    Radiology  Personally reviewed available imaging myself.   CXR IMPRESSION:  No active cardiopulmonary disease.    Procedures  Procedure(s) performed (including critical care):  .Lumbar Puncture  Date/Time: 02/28/2020 10:37 PM Performed by: 04/29/2020., MD Authorized by: Miguel Aschoff., MD   Consent:    Consent obtained:  Verbal   Consent given by:  Patient   Risks discussed:  Headache, bleeding, nerve damage, infection, pain and repeat procedure Pre-procedure details:    Procedure purpose:  Diagnostic Anesthesia (see MAR for exact dosages):    Anesthesia method:  Local infiltration   Local anesthetic:  Lidocaine 1% w/o epi Procedure details:    Lumbar space:  L4-L5 interspace   Patient position:  L lateral decubitus   Needle gauge:  22   Needle type:  Spinal needle - Quincke tip   Needle length (in):  3.5   Number of attempts:  1   Fluid appearance:  Clear   Tubes of fluid:  4 Post-procedure:    Puncture site:  Adhesive bandage applied   Patient tolerance of procedure:  Tolerated well, no immediate complications     Initial Impression / Assessment and Plan / MDM / ED Course  69 y.o. female who presents to the ED for fever and fatigue.  Ddx: COVID reinfection, pneumonia, UTI, anemia, electrolyte abnormality, other viral process.  On arrival to the ED, patient is febrile to 103.2 and tachycardic at 115.  Repeat blood pressure after being roomed 94/39.  Labs initiated in triage reveal leukocytosis to 12.6.  Mild hyponatremia and hypochloremia as well  as slight bump in creatinine 1.18.  Given this, will proceed with fluids, empiric antibiotics.   Discussed her unique COVID testing situation with ID, Dr. 79 who agrees with repeat testing. Ordering the 2 hours test will give Rivka Safer a CT value (if PCR is positive). If CT value > 34 this would likely indicate remote infection.  If less than 34 this would indicate new/acute infection.  Clinical Course as of Feb 28 106  Thu Feb 28, 2020  1719  Discussed case w/ lab. Unfortunately the CT value for her swab in January is no longer on file for reference.   [SM]  1904 COVID swab negative. Lactic within normal limits.   [SM]  1939 UA with small LE and WBC, no bacteria or nitrite, not convincing for infection. Unclear etiology of her fever at this time, but receiving empiric antibiotics and fluids. Will admit.   [SM]  2126 Hospitalist requesting LP. Patient states she has had an on and off headache for the last few days, but this is not unusual for her. No different or worse than normal. Has hx of neck surgery w/ some residual soreness, again no acute changes. No meningismus on exam. However with her symptoms + fever + on Plaquenil, consider LP. Discussed risk/benefit of LP with patient, she is agreeable and consents.    [SM]  2237 LP completed, patient tolerated well. Tubes sent, orders completed by admitting provider.    [SM]    Clinical Course User Index [SM] Miguel Aschoff., MD     _______________________________   As part of my medical decision making I have reviewed available labs, radiology tests, reviewed old records/chart review, obtained additional history from family, and discussed with consultants (ID).     Final Clinical Impression(s) / ED Diagnosis  Final diagnoses:  Generalized weakness  Fever in adult       Note:  This document was prepared using Dragon voice recognition software and may include unintentional dictation errors.   Miguel Aschoff., MD 02/29/20  [phone]

## 2020-02-29 ENCOUNTER — Other Ambulatory Visit: Payer: Self-pay

## 2020-02-29 DIAGNOSIS — I959 Hypotension, unspecified: Secondary | ICD-10-CM

## 2020-02-29 DIAGNOSIS — Z881 Allergy status to other antibiotic agents status: Secondary | ICD-10-CM

## 2020-02-29 DIAGNOSIS — I2510 Atherosclerotic heart disease of native coronary artery without angina pectoris: Secondary | ICD-10-CM

## 2020-02-29 DIAGNOSIS — E785 Hyperlipidemia, unspecified: Secondary | ICD-10-CM

## 2020-02-29 DIAGNOSIS — R509 Fever, unspecified: Secondary | ICD-10-CM

## 2020-02-29 DIAGNOSIS — Z880 Allergy status to penicillin: Secondary | ICD-10-CM

## 2020-02-29 DIAGNOSIS — Z8616 Personal history of COVID-19: Secondary | ICD-10-CM

## 2020-02-29 DIAGNOSIS — I10 Essential (primary) hypertension: Secondary | ICD-10-CM

## 2020-02-29 DIAGNOSIS — Z981 Arthrodesis status: Secondary | ICD-10-CM

## 2020-02-29 DIAGNOSIS — R42 Dizziness and giddiness: Secondary | ICD-10-CM

## 2020-02-29 DIAGNOSIS — M549 Dorsalgia, unspecified: Secondary | ICD-10-CM

## 2020-02-29 DIAGNOSIS — N179 Acute kidney failure, unspecified: Secondary | ICD-10-CM

## 2020-02-29 DIAGNOSIS — G8929 Other chronic pain: Secondary | ICD-10-CM

## 2020-02-29 DIAGNOSIS — A419 Sepsis, unspecified organism: Secondary | ICD-10-CM

## 2020-02-29 DIAGNOSIS — E119 Type 2 diabetes mellitus without complications: Secondary | ICD-10-CM

## 2020-02-29 DIAGNOSIS — R519 Headache, unspecified: Secondary | ICD-10-CM

## 2020-02-29 DIAGNOSIS — J449 Chronic obstructive pulmonary disease, unspecified: Secondary | ICD-10-CM

## 2020-02-29 DIAGNOSIS — M069 Rheumatoid arthritis, unspecified: Secondary | ICD-10-CM

## 2020-02-29 LAB — HEPATIC FUNCTION PANEL
ALT: 23 U/L (ref 0–44)
AST: 28 U/L (ref 15–41)
Albumin: 3.1 g/dL — ABNORMAL LOW (ref 3.5–5.0)
Alkaline Phosphatase: 77 U/L (ref 38–126)
Bilirubin, Direct: 0.2 mg/dL (ref 0.0–0.2)
Indirect Bilirubin: 0.8 mg/dL (ref 0.3–0.9)
Total Bilirubin: 1 mg/dL (ref 0.3–1.2)
Total Protein: 6.1 g/dL — ABNORMAL LOW (ref 6.5–8.1)

## 2020-02-29 LAB — BASIC METABOLIC PANEL
Anion gap: 11 (ref 5–15)
BUN: 19 mg/dL (ref 8–23)
CO2: 20 mmol/L — ABNORMAL LOW (ref 22–32)
Calcium: 8.3 mg/dL — ABNORMAL LOW (ref 8.9–10.3)
Chloride: 103 mmol/L (ref 98–111)
Creatinine, Ser: 1.34 mg/dL — ABNORMAL HIGH (ref 0.44–1.00)
GFR calc Af Amer: 47 mL/min — ABNORMAL LOW (ref >60)
GFR calc non Af Amer: 41 mL/min — ABNORMAL LOW (ref >60)
Glucose, Bld: 121 mg/dL — ABNORMAL HIGH (ref 70–99)
Potassium: 3.1 mmol/L — ABNORMAL LOW (ref 3.5–5.1)
Sodium: 134 mmol/L — ABNORMAL LOW (ref 135–145)

## 2020-02-29 LAB — VANCOMYCIN, RANDOM: Vancomycin Rm: 7

## 2020-02-29 LAB — CBC
HCT: 33.6 % — ABNORMAL LOW (ref 36.0–46.0)
Hemoglobin: 10.9 g/dL — ABNORMAL LOW (ref 12.0–15.0)
MCH: 29.5 pg (ref 26.0–34.0)
MCHC: 32.4 g/dL (ref 30.0–36.0)
MCV: 91.1 fL (ref 80.0–100.0)
Platelets: 218 10*3/uL (ref 150–400)
RBC: 3.69 MIL/uL — ABNORMAL LOW (ref 3.87–5.11)
RDW: 13.2 % (ref 11.5–15.5)
WBC: 11.2 10*3/uL — ABNORMAL HIGH (ref 4.0–10.5)
nRBC: 0 % (ref 0.0–0.2)

## 2020-02-29 LAB — SEDIMENTATION RATE: Sed Rate: 63 mm/hr — ABNORMAL HIGH (ref 0–30)

## 2020-02-29 LAB — C-REACTIVE PROTEIN: CRP: 27.7 mg/dL — ABNORMAL HIGH (ref <1.0)

## 2020-02-29 LAB — HIV ANTIBODY (ROUTINE TESTING W REFLEX): HIV Screen 4th Generation wRfx: NONREACTIVE

## 2020-02-29 MED ORDER — ASPIRIN EC 81 MG PO TBEC
81.0000 mg | DELAYED_RELEASE_TABLET | Freq: Every day | ORAL | Status: DC
  Administered 2020-02-29 – 2020-03-06 (×7): 81 mg via ORAL
  Filled 2020-02-29 (×7): qty 1

## 2020-02-29 MED ORDER — FERROUS SULFATE 325 (65 FE) MG PO TABS
325.0000 mg | ORAL_TABLET | Freq: Two times a day (BID) | ORAL | Status: DC
  Administered 2020-02-29 – 2020-03-06 (×13): 325 mg via ORAL
  Filled 2020-02-29 (×14): qty 1

## 2020-02-29 MED ORDER — PRAVASTATIN SODIUM 20 MG PO TABS
20.0000 mg | ORAL_TABLET | Freq: Every day | ORAL | Status: DC
  Administered 2020-02-29 – 2020-03-05 (×6): 20 mg via ORAL
  Filled 2020-02-29 (×7): qty 1

## 2020-02-29 MED ORDER — ALBUTEROL SULFATE (2.5 MG/3ML) 0.083% IN NEBU
2.5000 mg | INHALATION_SOLUTION | Freq: Four times a day (QID) | RESPIRATORY_TRACT | Status: DC | PRN
  Administered 2020-02-29 – 2020-03-04 (×9): 2.5 mg via RESPIRATORY_TRACT
  Filled 2020-02-29 (×9): qty 3

## 2020-02-29 MED ORDER — BUDESONIDE 0.25 MG/2ML IN SUSP
2.0000 mL | Freq: Two times a day (BID) | RESPIRATORY_TRACT | Status: DC
  Administered 2020-02-29 – 2020-03-04 (×9): 0.25 mg via RESPIRATORY_TRACT
  Filled 2020-02-29 (×9): qty 2

## 2020-02-29 MED ORDER — DEXTROSE 5 % IV SOLN
10.0000 mg/kg | Freq: Two times a day (BID) | INTRAVENOUS | Status: DC
  Administered 2020-02-29: 12:00:00 735 mg via INTRAVENOUS
  Filled 2020-02-29 (×2): qty 14.7

## 2020-02-29 MED ORDER — VITAMIN B-12 1000 MCG PO TABS
1000.0000 ug | ORAL_TABLET | Freq: Every day | ORAL | Status: DC
  Administered 2020-02-29 – 2020-03-06 (×7): 1000 ug via ORAL
  Filled 2020-02-29 (×7): qty 1

## 2020-02-29 MED ORDER — LORATADINE 10 MG PO TABS
10.0000 mg | ORAL_TABLET | Freq: Every evening | ORAL | Status: DC
  Administered 2020-02-29 – 2020-03-05 (×6): 10 mg via ORAL
  Filled 2020-02-29 (×6): qty 1

## 2020-02-29 MED ORDER — ACETAMINOPHEN 325 MG PO TABS
650.0000 mg | ORAL_TABLET | Freq: Four times a day (QID) | ORAL | Status: DC | PRN
  Administered 2020-02-29 – 2020-03-04 (×8): 650 mg via ORAL
  Filled 2020-02-29 (×8): qty 2

## 2020-02-29 MED ORDER — MONTELUKAST SODIUM 10 MG PO TABS
10.0000 mg | ORAL_TABLET | Freq: Every day | ORAL | Status: DC
  Administered 2020-02-29 – 2020-03-05 (×7): 10 mg via ORAL
  Filled 2020-02-29 (×8): qty 1

## 2020-02-29 MED ORDER — PANTOPRAZOLE SODIUM 40 MG PO TBEC
40.0000 mg | DELAYED_RELEASE_TABLET | Freq: Every day | ORAL | Status: DC
  Administered 2020-02-29 – 2020-03-06 (×8): 40 mg via ORAL
  Filled 2020-02-29 (×8): qty 1

## 2020-02-29 MED ORDER — LEVOCETIRIZINE DIHYDROCHLORIDE 5 MG PO TABS: 5.0000 mg | ORAL_TABLET | Freq: Every evening | ORAL | Status: DC

## 2020-02-29 MED ORDER — SODIUM CHLORIDE 0.9 % IV SOLN
1.0000 g | Freq: Two times a day (BID) | INTRAVENOUS | Status: DC
  Administered 2020-03-01 – 2020-03-02 (×3): 1 g via INTRAVENOUS
  Filled 2020-02-29 (×5): qty 1

## 2020-02-29 MED ORDER — VANCOMYCIN VARIABLE DOSE PER UNSTABLE RENAL FUNCTION (PHARMACIST DOSING): Status: DC

## 2020-02-29 MED ORDER — POTASSIUM CHLORIDE CRYS ER 20 MEQ PO TBCR
40.0000 meq | EXTENDED_RELEASE_TABLET | Freq: Once | ORAL | Status: AC
  Administered 2020-02-29: 40 meq via ORAL
  Filled 2020-02-29: qty 2

## 2020-02-29 NOTE — Progress Notes (Signed)
[place] [facility] [place] NAME: Misty Briggs    MR#:  [id number]  DATE OF BIRTH:  [birth year]  SUBJECTIVE:   Patient came in with headache mild back pain and high-grade fever 103 continues to have fever. Some chills. Poor appetite. No nausea vomiting abdominal pain or diarrhea no recent travel  REVIEW OF SYSTEMS:   Review of Systems  Constitutional: Positive for chills, fever and malaise/fatigue. Negative for weight loss.  HENT: Negative for ear discharge, ear pain and nosebleeds.   Eyes: Negative for blurred vision, pain and discharge.  Respiratory: Negative for sputum production, shortness of breath, wheezing and stridor.   Cardiovascular: Negative for chest pain, palpitations, orthopnea and PND.  Gastrointestinal: Negative for abdominal pain, diarrhea, nausea and vomiting.  Genitourinary: Negative for frequency and urgency.  Musculoskeletal: Positive for back pain and joint pain.  Neurological: Negative for sensory change, speech change, focal weakness and weakness.  Psychiatric/Behavioral: Negative for depression and hallucinations. The patient is not nervous/anxious.    Tolerating Diet:some Tolerating PT:   DRUG ALLERGIES:   Allergies  Allergen Reactions  . Doxycycline Other (See Comments)    Thrush  . Penicillins Hives, Palpitations and Rash  . Azithromycin Other (See Comments)    Thrush  . Levofloxacin Other (See Comments)    Joint pain/muscle pain  . Sulfa Antibiotics Rash    VITALS:  Blood pressure 111/70, pulse (!) 123, temperature (!) 101.7 F (38.7 C), temperature source Oral, resp. rate (!) 24, height 5\' 5"  (1.651 m), weight 73.5 kg, SpO2 93 %.  PHYSICAL EXAMINATION:   Physical Exam  GENERAL:  69 y.o.-year-old patient lying in the bed with no acute distress. weak EYES: Pupils equal, round, reactive to light and accommodation. No scleral icterus.   HEENT: Head atraumatic, normocephalic. Oropharynx and nasopharynx  clear.  NECK:  Supple, no jugular venous distention. No thyroid enlargement, no tenderness.  LUNGS: Normal breath sounds bilaterally, no wheezing, rales, rhonchi. No use of accessory muscles of respiration.  CARDIOVASCULAR: S1, S2 normal. No murmurs, rubs, or gallops.  ABDOMEN: Soft, nontender, nondistended. Bowel sounds present. No organomegaly or mass.  EXTREMITIES: No cyanosis, clubbing or edema b/l.    NEUROLOGIC: Cranial nerves II through XII are intact. No focal Motor or sensory deficits b/l.   PSYCHIATRIC:  patient is alert and oriented x 3.  SKIN: No obvious rash, lesion, or ulcer.   LABORATORY PANEL:  CBC Recent Labs  Lab 02/29/20 0538  WBC 11.2*  HGB 10.9*  HCT 33.6*  PLT 218    Chemistries  Recent Labs  Lab 02/29/20 0538  NA 134*  K 3.1*  CL 103  CO2 20*  GLUCOSE 121*  BUN 19  CREATININE 1.34*  CALCIUM 8.3*   Cardiac Enzymes No results for input(s): TROPONINI in the last 168 hours. RADIOLOGY:  DG Chest 2 View  Result Date: 02/28/2020 CLINICAL DATA:  Fever EXAM: CHEST - 2 VIEW COMPARISON:  12/12/2019 FINDINGS: The heart size and mediastinal contours are within normal limits. Previously seen hazy interstitial opacities on prior x-ray have largely resolved. There is minimal persistent interstitial prominence in the left lung base without focal airspace consolidation. No pleural effusion or pneumothorax. IMPRESSION: No active cardiopulmonary disease. Electronically Signed   By: Davina Poke D.O.   On: 02/28/2020 12:46   ASSESSMENT AND PLAN:  Misty Briggs is a 69 y.o. female with medical history significant for asthma, COPD, hypertension, GERD, hyperlipidemia and Covid infection about 5 months ago  who presents with concerns of fever and generalized fatigue. Her symptoms started acutely last night when she developed fever up to 103 and has noted generalized weakness.  She has a mild cough but denies any runny nose.  Sepsis with unknown source Acute Kidney  injury/ATN  -pt is immunocompromised on hydroxycholorquine  -came in with fever 103, tachycardia, tachypnea and elevated white count. -Pro calcitonin 0.39 -received broad-spectrum antibiotic  -discussed with Dr. Noralee Space ID. Continue IV Rocephin till blood culture urine culture back. -Possibly could be viral. -LP obtained to assess for meningitis given patient endorsed headache, photophobia and neck stiffness.-- Results so far negative. -received IV vancomycin, meropenem and acyclovir  In the ER -Blood culture pending -UC pending -CXR negative -UA not impressive for UTI   Hypotension likely secondary to sepsis Has received 2 L of IV normal saline bolus in the ED and now is normotensive Continue to monitor Hold all antihypertensives  Acute kidney injury  Monitor after fluids Avoid nephrotoxic agents  -came in with creat 1.18--1.34  Rheumatoid arthritis Hold hydroxychloroquine during infection  COPD/asthma continue bronchodilators  GERD continue PPI  Hyperlipidemia Continue statin  DVT prophylaxis: SCDs Code Status: Full Family Communication: Plan discussed with patient at bedside  disposition Plan: Home with at least 2 midnight stays  -patient continues to have high-grade fever. Workup still pending. Getting IV antibiotics. Consults called: ID  TOTAL TIME TAKING CARE OF THIS PATIENT: *30* minutes.  >50% time spent on counselling and coordination of care  Note: This dictation was prepared with Dragon dictation along with smaller phrase technology. Any transcriptional errors that result from this process are unintentional.  Enedina Finner M.D    [facility]   CC: Primary care physician; Jerl Mina, MDPatient ID: Misty Briggs, female   DOB: 1950/12/10, 69 y.o.   MRN: [id number]

## 2020-02-29 NOTE — Progress Notes (Signed)
Pharmacy Antibiotic Note  Misty Briggs is a 69 y.o. female admitted on 02/28/2020 with meningitis.  Pharmacy has been consulted for vanc/meropenem/acyclovir dosing.  Plan: Patient received vanc 1.5g IV load in ED  Vancomycin 1000 mg IV Q 24 hrs. Goal AUC 400-550. Expected AUC: 485.1 SCr used: 1.18 Cssmin: 12.7  Will start meropenem 1g IV q12h and acyclovir 10 mg/kg per adj body weight (635 mg) q12h per CrCl 30 - 60 ml/min and will continue to monitor and adjust doses per changes in renal fx and/or clinical course.  Height: 5\' 5"  (165.1 cm) Weight: 73.5 kg (162 lb 0.6 oz) IBW/kg (Calculated) : 57  Temp (24hrs), Avg:101.5 F (38.6 C), Min:98.2 F (36.8 C), Max:103.2 F (39.6 C)  Recent Labs  Lab 02/28/20 1207 02/28/20 1755  WBC 12.6*  --   CREATININE 1.18*  --   LATICACIDVEN  --  1.4    Estimated Creatinine Clearance: 46.4 mL/min (A) (by C-G formula based on SCr of 1.18 mg/dL (H)).    Allergies  Allergen Reactions  . Doxycycline Other (See Comments)    Thrush  . Penicillins Hives, Palpitations and Rash  . Azithromycin Other (See Comments)    Thrush  . Levofloxacin Other (See Comments)    Joint pain/muscle pain  . Sulfa Antibiotics Rash   Thank you for allowing pharmacy to be a part of this patient's care.  04/29/20, PharmD, BCPS Clinical Pharmacist 02/29/2020 3:40 AM

## 2020-02-29 NOTE — Progress Notes (Signed)
Pharmacy Antibiotic Note  Misty Briggs is a 69 y.o. female admitted on 02/28/2020 with meningitis.  Pharmacy has been consulted for vanc/meropenem/acyclovir dosing.  Plan: Patient received vanc 1.5g IV load in ED. Scr trending up 1.18 > 1.34. Will dose by levels. Ordered vancomycin random at 1800 for today. Follow up with creatinine with AM labs.   Update @ 1913:  4/9 Vancomycin Random @ 1754: 7 mcg/mL, subtherapeutic. Will check Scr with AM labs. If renal function has not worsened, will order another vancomycin dose that will be due in the morning- which would reflect ~ 36 hour vancomycin interval. Of note, patient is taking a NSAID (aspirin) while taking Vancomycin would could play a very small role in worsening renal function.   Height: 5\' 5"  (165.1 cm) Weight: 73.5 kg (162 lb 0.6 oz) IBW/kg (Calculated) : 57  Temp (24hrs), Avg:100.2 F (37.9 C), Min:98.4 F (36.9 C), Max:103.3 F (39.6 C)  Recent Labs  Lab 02/28/20 1207 02/28/20 1755 02/29/20 0538 02/29/20 1754  WBC 12.6*  --  11.2*  --   CREATININE 1.18*  --  1.34*  --   LATICACIDVEN  --  1.4  --   --   VANCORANDOM  --   --   --  7    Estimated Creatinine Clearance: 40.9 mL/min (A) (by C-G formula based on SCr of 1.34 mg/dL (H)).    Allergies  Allergen Reactions  . Doxycycline Other (See Comments)    Thrush  . Penicillins Hives, Palpitations and Rash  . Azithromycin Other (See Comments)    Thrush  . Levofloxacin Other (See Comments)    Joint pain/muscle pain  . Sulfa Antibiotics Rash   Thank you for allowing pharmacy to be a part of this patient's care.  04/30/20, PharmD Clinical Pharmacist 02/29/2020 7:12 PM

## 2020-02-29 NOTE — Consult Note (Signed)
NAME: Misty Briggs  DOB: 08-11-1951  MRN: [id number]  Date/Time: 02/29/2020 2:37 PM  REQUESTING PROVIDER: patel Subjective:  REASON FOR CONSULT: Fever/immune compromised ? Misty Briggs is a 69 y.o. with a history of Rheumatoid arthritis on hydroxychloroquine, COVID illness in NOV 2020 needing 2 hospitalization for resp distress, HTN Asthma/COPD, T2DM, Hyperlipidemia , CAD, anterior fusion cervical spine with hardware Presents with feeling wobbly, fever of 103 X 1 day.  headache , neck stiffness  As per patient she was feeling okay on Easter. On Monday she had a headache and thought it was due to high BP. On Tuesday she went to a ball game for 2 hours and came home, felt tired, poor appetite and went to bed. Next day she had a temp of 103, was having headaches and feeling weak. As this continued she came to the ED yesterday In the Ed her temp was 103.2, HR 115, BP 130/66 WBC 12.6, HB 12.9, lactate 1.4, procal 0.39 LP done and she was started on vanco, meropenem  and acylcovir and I am asked to see the patient for r/o meningitis  Pt has baseline cough since COVID and also with underlying COPD, not productive of sputum. No sore throat, no pain in throat, no dysuria, no pain abdomen, no diarrhea. No animal bites Has hardware cervical spine Recent tooth cavity repair but no pain now Rheumatoid arthritis for more than 20 yrs. Was on Methotrexate which was discontinued since COVID and she saw her rheumatologist in feb 2021 and it was decided to hold Methotrexate till her lungs cleared- she is currently taking hydroxycholoroquine   IN NOV 2020 she had COVID pulmonary involvement and was hospitalized 11/4-11/9 and received remdisivir and steroids. 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 She came ot the ED on 12/12/19 with fever and  Cough and covid test was positive again. She was  not hospitalized She last saw Pulmonologist on 01/08/20   on 01/27/20  she tripped on her own feet and fell on to the hardwood floor hitting her left knee. She was able to get up and walk and subsequently had bruising and saw Dr.Kubinski on 02/12/20. Xray revealed no fractures but showed tricompartmental arthritis- She was managed conservatively .    Past Medical History:  Diagnosis Date  . Arthritis   . Asthma   . COPD (chronic obstructive pulmonary disease) (HCC)    Dr. Meredeth Ide, [facility] pulmonologist  . Diabetes mellitus   . GERD (gastroesophageal reflux disease)   . Heart murmur   . HLD (hyperlipidemia)   . Hypertension   . Myocardial infarction (HCC)    mild  . PONV (postoperative nausea and vomiting)    itching sometimes  . Rheumatoid arthritis(714.0)   . Shortness of breath     Past Surgical History:  Procedure Laterality Date  . ANTERIOR CERVICAL DECOMP/DISCECTOMY FUSION  05/05/2012   Procedure: ANTERIOR CERVICAL DECOMPRESSION/DISCECTOMY FUSION 1 LEVEL;  Surgeon: Karn Cassis, MD;  Location: [facility];  Service: Neurosurgery;  Laterality: N/A;  Cervical six seven Anterior cervical decompression/diskectomy, Fusion, Plate  . COLONOSCOPY WITH PROPOFOL N/A 09/08/2017   Procedure: COLONOSCOPY WITH PROPOFOL;  Surgeon: Christena Deem, MD;  Location: [facility] ENDOSCOPY;  Service: Endoscopy;  Laterality: N/A;  . ENDOMETRIAL ABLATION     novasure  . FOOT SURGERY  2015  . ROTATOR CUFF REPAIR  bilat  . TRANSTHORACIC ECHOCARDIOGRAM  2010   [facility]  . TUBAL LIGATION      Social History   Socioeconomic History  . Marital status: Married    Spouse name: Not on file  . Number of children: 3  . Years of education: Not on file  . Highest education level: Not on file  Occupational History  . Not on file  Tobacco Use  . Smoking status: Passive Smoke Exposure - Never Smoker  . Smokeless tobacco: Never Used  Substance and Sexual Activity  .  Alcohol use: Not Currently    Alcohol/week: 0.0 standard drinks  . Drug use: No  . Sexual activity: Not on file  Other Topics Concern  . Not on file  Social History Narrative  . Not on file   Social Determinants of Health   Financial Resource Strain: Low Risk   . Difficulty of Paying Living Expenses: Not hard at all  Food Insecurity: No Food Insecurity  . Worried About Programme researcher, broadcasting/film/video in the Last Year: Never true  . Ran Out of Food in the Last Year: Never true  Transportation Needs: No Transportation Needs  . Lack of Transportation (Medical): No  . Lack of Transportation (Non-Medical): No  Physical Activity: Unknown  . Days of Exercise per Week: Patient refused  . Minutes of Exercise per Session: Patient refused  Stress:   . Feeling of Stress :   Social Connections: Unknown  . Frequency of Communication with Friends and Family: More than three times a week  . Frequency of Social Gatherings with Friends and Family: More than three times a week  . Attends Religious Services: Not on file  . Active Member of Clubs or Organizations: Not on file  . Attends Banker Meetings: Not on file  . Marital Status: Married  Catering manager Violence:   . Fear of Current or Ex-Partner:   . Emotionally Abused:   Marland Kitchen Physically Abused:   . Sexually Abused:     Family History  Problem Relation Age of Onset  . Breast cancer Paternal Aunt    Allergies  Allergen Reactions  . Doxycycline Other (See Comments)    Thrush  . Penicillins Hives, Palpitations and Rash  . Azithromycin Other (See Comments)    Thrush  . Levofloxacin Other (See Comments)    Joint pain/muscle pain  . Sulfa Antibiotics Rash    ? Current Facility-Administered Medications  Medication Dose Route Frequency Provider Last Rate Last Admin  . acetaminophen (TYLENOL) tablet 650 mg  650 mg Oral Q6H PRN Tu, Ching T, DO   650 mg at 02/29/20 1428  . acyclovir (ZOVIRAX) 735 mg in dextrose 5 % 150 mL IVPB  10  mg/kg Intravenous Q12H Enedina Finner, MD   Stopped at 02/29/20 1307  . albuterol (PROVENTIL) (2.5 MG/3ML) 0.083% nebulizer solution 2.5 mg  2.5 mg Inhalation Q6H PRN Tu, Ching T, DO      . aspirin EC tablet 81 mg  81 mg Oral Daily Tu, Ching T, DO   81 mg at 02/29/20 0935  . budesonide (PULMICORT) nebulizer solution 0.25 mg  2 mL Inhalation BID Tu, Ching T, DO   0.25 mg at 02/29/20 0903  . ferrous sulfate tablet 325 mg  325 mg Oral BID WC Tu, Ching T, DO   325 mg at 02/29/20 0936  . ipratropium-albuterol (DUONEB) 0.5-2.5 (3) MG/3ML nebulizer solution 3 mL  3 mL Nebulization Once Tu, Ching T, DO   Stopped  at 02/28/20 2337  . loratadine (CLARITIN) tablet 10 mg  10 mg Oral QPM Tu, Ching T, DO      . meropenem (MERREM) 1 g in sodium chloride 0.9 % 100 mL IVPB  1 g Intravenous Q12H Tu, Ching T, DO   Stopped at 02/29/20 1003  . montelukast (SINGULAIR) tablet 10 mg  10 mg Oral QHS Tu, Ching T, DO   10 mg at 02/29/20 0518  . pantoprazole (PROTONIX) EC tablet 40 mg  40 mg Oral Daily Tu, Ching T, DO   40 mg at 02/29/20 0935  . pravastatin (PRAVACHOL) tablet 20 mg  20 mg Oral q1800 Tu, Ching T, DO      . sodium chloride flush (NS) 0.9 % injection 3 mL  3 mL Intravenous Once Tu, Ching T, DO      . vancomycin variable dose per unstable renal function (pharmacist dosing)   Does not apply See admin instructions Ronnald Ramp, RPH      . vitamin B-12 (CYANOCOBALAMIN) tablet 1,000 mcg  1,000 mcg Oral Daily Tu, Ching T, DO   1,000 mcg at 02/29/20 0935     Abtx:  Anti-infectives (From admission, onward)   Start     Dose/Rate Route Frequency Ordered Stop   02/29/20 1000  acyclovir (ZOVIRAX) 735 mg in dextrose 5 % 150 mL IVPB     10 mg/kg  73.5 kg 164.7 mL/hr over 60 Minutes Intravenous Every 12 hours 02/29/20 0858     02/29/20 0853  vancomycin variable dose per unstable renal function (pharmacist dosing)      Does not apply See admin instructions 02/29/20 0853     02/28/20 2230  meropenem (MERREM) 1 g in sodium  chloride 0.9 % 100 mL IVPB     1 g 200 mL/hr over 30 Minutes Intravenous Every 12 hours 02/28/20 2215     02/28/20 2215  vancomycin (VANCOREADY) IVPB 500 mg/100 mL     500 mg 100 mL/hr over 60 Minutes Intravenous  Once 02/28/20 2211 02/29/20 0202   02/28/20 2215  acyclovir (ZOVIRAX) 635 mg in dextrose 5 % 100 mL IVPB  Status:  Discontinued     10 mg/kg  63.6 kg (Adjusted) 112.7 mL/hr over 60 Minutes Intravenous Every 12 hours 02/28/20 2211 02/29/20 0858   02/28/20 1800  aztreonam (AZACTAM) 2 g in sodium chloride 0.9 % 100 mL IVPB     2 g 200 mL/hr over 30 Minutes Intravenous  Once 02/28/20 1728 02/28/20 1917   02/28/20 1730  vancomycin (VANCOCIN) IVPB 1000 mg/200 mL premix     1,000 mg 200 mL/hr over 60 Minutes Intravenous  Once 02/28/20 1728 02/28/20 1901      REVIEW OF SYSTEMS:  Const:  fever, sweating, negative weight loss Eyes: negative diplopia or visual changes, negative eye pain ENT: negative coryza, negative sore throat Resp: dry cough, nohemoptysis, has dyspnea Cards: negative for chest pain, palpitations, lower extremity edema GU: negative for frequency, dysuria and hematuria GI: Negative for abdominal pain, diarrhea, bleeding, constipation Skin: negative for rash and pruritus Heme: negative for easy bruising and gum/nose bleeding MS: chronic back pain worse at LP site, has neck pain and pain in finger joints, thmb left Neurolo:++ headaches, dizziness, vertigo, memory problems  Psych: negative for feelings of anxiety, depression  Endocrine: negative for thyroid, diabetes Allergy/Immunology-as above Objective:  VITALS:  BP (!) 122/55 (BP Location: Right Arm)   Pulse (!) 127   Temp (!) 103.3 F (39.6 C) (Oral)   Resp Marland Kitchen)  28   Ht 5\' 5"  (1.651 m)   Wt 73.5 kg   SpO2 98%   BMI 26.96 kg/m  PHYSICAL EXAM:  General: Alert, cooperative, in some  distress, appears stated age.  Head: Normocephalic, without obvious abnormality, atraumatic. Eyes: Conjunctivae clear,  anicteric sclerae. Pupils are equal ENT Nares normal. No drainage or sinus tenderness. Lips, mucosa, and tongue normal. White patch tongue Neck: Supple, symmetrical, no adenopathy, thyroid: non tender no carotid bruit and no JVD. Back: No CVA tenderness. Lungs: Clear to auscultation bilaterally. No Wheezing or Rhonchi. No rales. Heart: s1s2 Abdomen: Soft, non-tender,not distended. Bowel sounds normal. No masses Extremities: atraumatic, no cyanosis. No edema. No clubbing Skin: No rashes or lesions. Or bruising Lymph: Cervical, supraclavicular normal. Neurologic: Grossly non-focal Pertinent Labs Lab Results CBC    Component Value Date/Time   WBC 11.2 (H) 02/29/2020 0538   RBC 3.69 (L) 02/29/2020 0538   HGB 10.9 (L) 02/29/2020 0538   HCT 33.6 (L) 02/29/2020 0538   PLT 218 02/29/2020 0538   MCV 91.1 02/29/2020 0538   MCH 29.5 02/29/2020 0538   MCHC 32.4 02/29/2020 0538   RDW 13.2 02/29/2020 0538   LYMPHSABS 0.7 12/12/2019 1356   MONOABS 0.7 12/12/2019 1356   EOSABS 0.1 12/12/2019 1356   BASOSABS 0.1 12/12/2019 1356    CMP Latest Ref Rng & Units 02/29/2020 02/28/2020 12/12/2019  Glucose 70 - 99 mg/dL 12/14/2019) 270(W) 237(S)  BUN 8 - 23 mg/dL 19 14 11   Creatinine 0.44 - 1.00 mg/dL 283(T) ) 5.17(O)  Sodium 135 - 145 mmol/L 134(L) 130(L) 135  Potassium 3.5 - 5.1 mmol/L 3.1(L) 4.5 3.6  Chloride 98 - 111 mmol/L 103 96(L) 99  CO2 22 - 32 mmol/L 20(L) 23 23  Calcium 8.9 - 10.3 mg/dL 8.3(L) 8.7(L) 8.8(L)  Total Protein 6.5 - 8.1 g/dL - - 7.0  Total Bilirubin 0.3 - 1.2 mg/dL - - 1.2  Alkaline Phos 38 - 126 U/L - - 87  AST 15 - 41 U/L - - 20  ALT 0 - 44 U/L - - 20      Microbiology: Recent Results (from the past 240 hour(s))  Respiratory Panel by RT PCR (Flu A&B, Covid) - Nasopharyngeal Swab     Status: None   Collection Time: 02/28/20  6:03 PM   Specimen: Nasopharyngeal Swab  Result Value Ref Range Status   SARS Coronavirus 2 by RT PCR NEGATIVE NEGATIVE Final    Comment:  (NOTE) SARS-CoV-2 target nucleic acids are NOT DETECTED. The SARS-CoV-2 RNA is generally detectable in upper respiratoy specimens during the acute phase of infection. The lowest concentration of SARS-CoV-2 viral copies this assay can detect is 131 copies/mL. A negative result does not preclude SARS-Cov-2 infection and should not be used as the sole basis for treatment or other patient management decisions. A negative result may occur with  improper specimen collection/handling, submission of specimen other than nasopharyngeal swab, presence of viral mutation(s) within the areas targeted by this assay, and inadequate number of viral copies (<131 copies/mL). A negative result must be combined with clinical observations, patient history, and epidemiological information. The expected result is Negative. Fact Sheet for Patients:  3.71(G Fact Sheet for Healthcare Providers:  04/29/20 This test is not yet ap proved or cleared by the https://www.moore.com/ FDA and  has been authorized for detection and/or diagnosis of SARS-CoV-2 by FDA under an Emergency Use Authorization (EUA). This EUA will remain  in effect (meaning this test can be used) for the duration  of the COVID-19 declaration under Section 564(b)(1) of the Act, 21 U.S.C. section 360bbb-3(b)(1), unless the authorization is terminated or revoked sooner.    Influenza A by PCR NEGATIVE NEGATIVE Final   Influenza B by PCR NEGATIVE NEGATIVE Final    Comment: (NOTE) The Xpert Xpress SARS-CoV-2/FLU/RSV assay is intended as an aid in  the diagnosis of influenza from Nasopharyngeal swab specimens and  should not be used as a sole basis for treatment. Nasal washings and  aspirates are unacceptable for Xpert Xpress SARS-CoV-2/FLU/RSV  testing. Fact Sheet for Patients: PinkCheek.be Fact Sheet for Healthcare  Providers: GravelBags.it This test is not yet approved or cleared by the [place] FDA and  has been authorized for detection and/or diagnosis of SARS-CoV-2 by  FDA under an Emergency Use Authorization (EUA). This EUA will remain  in effect (meaning this test can be used) for the duration of the  Covid-19 declaration under Section 564(b)(1) of the Act, 21  U.S.C. section 360bbb-3(b)(1), unless the authorization is  terminated or revoked. Performed at [facility], [address] [place], [place] [address]   CSF culture     Status: None (Preliminary result)   Collection Time: 02/28/20 10:15 PM   Specimen: CSF; Cerebrospinal Fluid  Result Value Ref Range Status   Specimen Description CSF  Final   Special Requests Immunocompromised  Final   Gram Stain   Final    WBC SEEN NO ORGANISMS SEEN Performed at [facility], [address] [place], [place] [address]    Culture PENDING  Incomplete   Report Status PENDING  Incomplete   LP CSF - 2 RBC 0 wbc Protein 32  COVID test 11/24/19 positive 02/28/20 negative  IMAGING RESULTS:  I have personally reviewed the films Normal CXR ? Impression/Recommendation ? Febrile illness- unclear etiology No pneumonia, UTI, cellulitis or meningitis No epidemiological risk for tick borne illness  Immunecompromised due to Rheumatoid arthritis and was on Methotrexate until some time early this year. Was also on steroids until Jan and now on inhaled steroids Now on hydroxychloroquine She is at risk for atypical infections- will check fungal antibodies, beta D glucan  Rheumatoid arthritis- off Methotrexate- could this be a flare up??  Await cultures Will continue vanco and meropenem DC acyclovir  PCN allergy- has Type ! Reaction Doxy- only thrush- o not an allergy So is azithromycin Sulfa rash levaquin joint and muscle pain   COVID pneumonia in NOV- and long post covid respiratory  symptoms- on home oxygen   Discussed the management with patient and hospitalist ? ? ___________________________________________________ Discussed with patient, requesting provider Note:  This document was prepared using Dragon voice recognition software and may include unintentional dictation errors.

## 2020-02-29 NOTE — Progress Notes (Addendum)
Pharmacy Antibiotic Note  Misty Briggs is a 69 y.o. female admitted on 02/28/2020 with meningitis.  Pharmacy has been consulted for vanc/meropenem/acyclovir dosing.  Plan: Patient received vanc 1.5g IV load in ED. Scr trending up 1.18 > 1.3. Will dose by levels. Ordered vancomycin random at 1800 for today. Follow up with creatinine with AM labs.   Will start meropenem 1g IV q12h and acyclovir 10 mg/kg q12h per CrCl 30 - 60 ml/min and will continue to monitor and adjust doses per changes in renal fx and/or clinical course.  Height: 5\' 5"  (165.1 cm) Weight: 73.5 kg (162 lb 0.6 oz) IBW/kg (Calculated) : 57  Temp (24hrs), Avg:101.5 F (38.6 C), Min:98.2 F (36.8 C), Max:103.2 F (39.6 C)  Recent Labs  Lab 02/28/20 1207 02/28/20 1755 02/29/20 0538  WBC 12.6*  --  11.2*  CREATININE 1.18*  --  1.34*  LATICACIDVEN  --  1.4  --     Estimated Creatinine Clearance: 40.9 mL/min (A) (by C-G formula based on SCr of 1.34 mg/dL (H)).    Allergies  Allergen Reactions  . Doxycycline Other (See Comments)    Thrush  . Penicillins Hives, Palpitations and Rash  . Azithromycin Other (See Comments)    Thrush  . Levofloxacin Other (See Comments)    Joint pain/muscle pain  . Sulfa Antibiotics Rash   Thank you for allowing pharmacy to be a part of this patient's care.  04/30/20, PharmD, BCPS Clinical Pharmacist 02/29/2020 8:47 AM

## 2020-02-29 NOTE — Progress Notes (Signed)
Order received from Dr Rivka Safer to discontinue droplet isolation

## 2020-02-29 NOTE — Progress Notes (Signed)
Dr Allena Katz informed of elevated respirations and heart rate. She was already aware of the fever. MEWS is 6. Dr patel attributes this to the fever. Order received for a cardiac monitor to be placed on the patient

## 2020-02-29 NOTE — ED Notes (Signed)
Patient given breakfast tray. Sitting up on side of bed eating. No further needs expressed at this time.

## 2020-02-29 NOTE — H&P (Addendum)
History and Physical    Misty Briggs:[id number] DOB: Dec 27, 1950 DOA: 02/28/2020  PCP: Maryland Pink, MD  Patient coming from: Home, lives with husband and son  I have personally briefly reviewed patient's old medical records in [place]  Chief Complaint: Fever and fatigue  HPI: Misty Briggs is a 69 y.o. female with medical history significant for asthma, COPD, hypertension, GERD, hyperlipidemia and Covid infection about 5 months ago who presents with concerns of fever and generalized fatigue.  Her symptoms started acutely last night when she developed fever up to 103 and has noted generalized weakness.  She has a mild cough but denies any runny nose.  She notes some shortness of breath now because she is due for her breathing treatment. She has chronic back pain and notes that it has worsened this morning.  Also has noted headache for the past few days, had a focal sensitivity to the sun yesterday and notes neck stiffness this morning although she has had history of neck surgery. Otherwise, she denies any nausea, vomiting or diarrhea abdominal pain.  Denies any dysuria or urinary changes.  No recent surgery or wound.    She denies any tobacco, alcohol or illicit drug use.  ED Course: She was febrile up to 103.2, tachycardic and had hypotension of 94/39 that improved following 2 L of IV normal saline bolus.  WBC of 12.6.  Lactate of 1.4.  Procalcitonin of 0.39.  Sodium 130, potassium 4.5 with hemolysis, creatinine of 1.18 which is elevated from prior of 1.08. Chest x-ray negative.  UA shows small leukocyte, negative nitrite with no bacteria hyaline casts.  Review of Systems:  Constitutional: No Weight Change, + Fever ENT/Mouth: No sore throat, No Rhinorrhea Eyes: No Eye Pain, No Vision Changes Cardiovascular: No Chest Pain, no SOB Respiratory: + Cough, No Sputum Gastrointestinal: No Nausea, No Vomiting, No Diarrhea, No Constipation, No Pain Genitourinary: no Urinary  Incontinence Musculoskeletal: No Arthralgias, No Myalgias Skin: No Skin Lesions, No Pruritus, Neuro: no Weakness, No Numbnes Psych: No Anxiety/Panic, No Depression, no decrease appetite Heme/Lymph: No Bruising, No Bleeding  Past Medical History:  Diagnosis Date  . Arthritis   . Asthma   . COPD (chronic obstructive pulmonary disease) (HCC)    Dr. Raul Del, [facility] pulmonologist  . Diabetes mellitus   . GERD (gastroesophageal reflux disease)   . Heart murmur   . HLD (hyperlipidemia)   . Hypertension   . Myocardial infarction (HCC)    mild  . PONV (postoperative nausea and vomiting)    itching sometimes  . Rheumatoid arthritis(714.0)   . Shortness of breath     Past Surgical History:  Procedure Laterality Date  . ANTERIOR CERVICAL DECOMP/DISCECTOMY FUSION  05/05/2012   Procedure: ANTERIOR CERVICAL DECOMPRESSION/DISCECTOMY FUSION 1 LEVEL;  Surgeon: Floyce Stakes, MD;  Location: [facility] NEURO ORS;  Service: Neurosurgery;  Laterality: N/A;  Cervical six seven Anterior cervical decompression/diskectomy, Fusion, Plate  . COLONOSCOPY WITH PROPOFOL N/A 09/08/2017   Procedure: COLONOSCOPY WITH PROPOFOL;  Surgeon: Lollie Sails, MD;  Location: [facility] ENDOSCOPY;  Service: Endoscopy;  Laterality: N/A;  . ENDOMETRIAL ABLATION     novasure  . FOOT SURGERY  2015  . ROTATOR CUFF REPAIR     bilat  . TRANSTHORACIC ECHOCARDIOGRAM  2010   [facility]  . TUBAL LIGATION       reports that she is a non-smoker but has been exposed to tobacco smoke. She has never used smokeless tobacco. She reports previous  alcohol use. She reports that she does not use drugs.  Allergies  Allergen Reactions  . Doxycycline Other (See Comments)    Thrush  . Penicillins Hives, Palpitations and Rash  . Azithromycin Other (See Comments)    Thrush  . Levofloxacin Other (See Comments)    Joint pain/muscle pain  . Sulfa Antibiotics Rash    Family History  Problem Relation Age of Onset   . Breast cancer Paternal Aunt      Prior to Admission medications   Medication Sig Start Date End Date Taking? Authorizing Provider  acetaminophen (TYLENOL) 325 MG tablet Take 2 tablets (650 mg total) by mouth every 6 (six) hours as needed for mild pain or headache (fever >/= 101). 10/01/19  Yes Elgergawy, Leana Roe, MD  albuterol (VENTOLIN HFA) 108 (90 Base) MCG/ACT inhaler Inhale 1-2 puffs into the lungs every 6 (six) hours as needed for wheezing or shortness of breath.   Yes [provider]  Ascorbic Acid (VITAMIN C) 1000 MG tablet Take 1,000 mg by mouth daily.   Yes [provider]  aspirin EC 81 MG tablet Take by mouth daily.    Yes [provider]  beclomethasone (QVAR REDIHALER) 80 MCG/ACT inhaler Inhale 2 puffs into the lungs 2 (two) times daily. 01/08/20  Yes Salena Saner, MD  calcium carbonate (OS-CAL - DOSED IN MG OF ELEMENTAL CALCIUM) 1250 (500 Ca) MG tablet Take 2 tablets by mouth daily with breakfast.   Yes [provider]  Coenzyme Q10 (CO Q-10 PO) Take by mouth daily.   Yes [provider]  ferrous sulfate 325 (65 FE) MG tablet Take 1 tablet (325 mg total) by mouth 2 (two) times daily with a meal. 11/19/18 02/28/20 Yes Pyreddy, Vivien Rota, MD  fluticasone (FLONASE) 50 MCG/ACT nasal spray Place 1 spray into both nostrils daily as needed for allergies or rhinitis.   Yes [provider]  hydroxychloroquine (PLAQUENIL) 200 MG tablet Take by mouth. 01/03/20  Yes [provider]  levocetirizine (XYZAL) 5 MG tablet Take 5 mg by mouth every evening.   Yes [provider]  lovastatin (MEVACOR) 20 MG tablet Take 20 mg by mouth at bedtime.   Yes [provider]  magnesium gluconate (MAGONATE) 500 MG tablet Take 500 mg by mouth daily.   Yes [provider]  metFORMIN (GLUCOPHAGE) 1000 MG tablet Take 1 tablet (1,000 mg total) by mouth 2 (two) times daily with a meal. 11/19/18 02/28/20 Yes Pyreddy, Vivien Rota, MD    metoprolol tartrate (LOPRESSOR) 25 MG tablet  11/21/19  Yes [provider]  montelukast (SINGULAIR) 10 MG tablet Take 10 mg by mouth at bedtime.   Yes [provider]  Multiple Vitamin (MULTIVITAMIN) capsule Take 1 capsule by mouth daily.   Yes [provider]  nystatin (MYCOSTATIN) 100000 UNIT/ML suspension Use as directed 5 mLs in the mouth or throat 4 (four) times daily. Twice daily   Yes [provider]  omeprazole (PRILOSEC) 20 MG capsule Take 20 mg by mouth daily.   Yes [provider]  potassium chloride (KLOR-CON) 10 MEQ tablet Take 10 mEq by mouth daily as needed. 02/04/20  Yes [provider]  telmisartan (MICARDIS) 40 MG tablet Take 40 mg by mouth daily.   Yes [provider]  Tiotropium Bromide-Olodaterol (STIOLTO RESPIMAT) 2.5-2.5 MCG/ACT AERS Inhale 2 puffs into the lungs daily. 12/17/19  Yes Salena Saner, MD  vitamin B-12 (CYANOCOBALAMIN) 1000 MCG tablet Take 1,000 mcg by mouth daily.  Yes [provider]    Physical Exam: Vitals:   02/28/20 1900 02/28/20 1930 02/28/20 2000 02/29/20 0030  BP: 113/63 119/65 116/71 (!) 148/79  Pulse: 96 97 97 (!) 128  Resp: (!) 26 (!) 25 (!) 26 (!) 33  Temp:      TempSrc:      SpO2: 100% 98% 99% 94%  Weight:      Height:        Constitutional: NAD, calm, comfortable, nontoxic appearing female laying at 20 degree incline in bed Vitals:   02/28/20 1900 02/28/20 1930 02/28/20 2000 02/29/20 0030  BP: 113/63 119/65 116/71 (!) 148/79  Pulse: 96 97 97 (!) 128  Resp: (!) 26 (!) 25 (!) 26 (!) 33  Temp:      TempSrc:      SpO2: 100% 98% 99% 94%  Weight:      Height:       Eyes: PERRL, lids and conjunctivae normal ENMT: Mucous membranes are moist. Posterior pharynx clear of any exudate or lesions.Normal dentition.  Neck: normal, supple Respiratory: clear to auscultation bilaterally, no wheezing, no crackles. Normal respiratory effort. No accessory muscle use.   Cardiovascular: Sinus tachycardia, no murmurs / rubs / gallops. No extremity edema. 2+ pedal pulses.   Abdomen: no tenderness, no masses palpated.  Bowel sounds positive.  Musculoskeletal: no clubbing / cyanosis. No joint deformity upper and lower extremities. Good ROM, no contractures. Normal muscle tone.  Skin: no rashes, lesions, ulcers. No induration Neurologic: CN 2-12 grossly intact. Sensation intact, DTR normal. Strength 5/5 in all 4.  Negative Kernig and Brezinski sign Psychiatric: Normal judgment and insight. Alert and oriented x 3. Normal mood.     Labs on Admission: I have personally reviewed following labs and imaging studies  CBC: Recent Labs  Lab 02/28/20 1207  WBC 12.6*  HGB 12.9  HCT 39.1  MCV 89.3  PLT 268   Basic Metabolic Panel: Recent Labs  Lab 02/28/20 1207  NA 130*  K 4.5  CL 96*  CO2 23  GLUCOSE 111*  BUN 14  CREATININE 1.18*  CALCIUM 8.7*   GFR: Estimated Creatinine Clearance: 46.4 mL/min (A) (by C-G formula based on SCr of 1.18 mg/dL (H)). Liver Function Tests: No results for input(s): AST, ALT, ALKPHOS, BILITOT, PROT, ALBUMIN in the last 168 hours. No results for input(s): LIPASE, AMYLASE in the last 168 hours. No results for input(s): AMMONIA in the last 168 hours. Coagulation Profile: No results for input(s): INR, PROTIME in the last 168 hours. Cardiac Enzymes: No results for input(s): CKTOTAL, CKMB, CKMBINDEX, TROPONINI in the last 168 hours. BNP (last 3 results) No results for input(s): PROBNP in the last 8760 hours. HbA1C: No results for input(s): HGBA1C in the last 72 hours. CBG: No results for input(s): GLUCAP in the last 168 hours. Lipid Profile: No results for input(s): CHOL, HDL, LDLCALC, TRIG, CHOLHDL, LDLDIRECT in the last 72 hours. Thyroid Function Tests: No results for input(s): TSH, T4TOTAL, FREET4, T3FREE, THYROIDAB in the last 72 hours. Anemia Panel: No results for input(s): VITAMINB12, FOLATE, FERRITIN, TIBC, IRON,  RETICCTPCT in the last 72 hours. Urine analysis:    Component Value Date/Time   COLORURINE YELLOW (A) 02/28/2020 1923   APPEARANCEUR CLEAR (A) 02/28/2020 1923   LABSPEC 1.016 02/28/2020 1923   PHURINE 5.0 02/28/2020 1923   GLUCOSEU NEGATIVE 02/28/2020 1923   HGBUR NEGATIVE 02/28/2020 1923   BILIRUBINUR NEGATIVE 02/28/2020 1923   KETONESUR 5 (A) 02/28/2020 1923   PROTEINUR 100 (A) 02/28/2020  1923   NITRITE NEGATIVE 02/28/2020 1923   LEUKOCYTESUR SMALL (A) 02/28/2020 1923    Radiological Exams on Admission: DG Chest 2 View  Result Date: 02/28/2020 CLINICAL DATA:  Fever EXAM: CHEST - 2 VIEW COMPARISON:  12/12/2019 FINDINGS: The heart size and mediastinal contours are within normal limits. Previously seen hazy interstitial opacities on prior x-ray have largely resolved. There is minimal persistent interstitial prominence in the left lung base without focal airspace consolidation. No pleural effusion or pneumothorax. IMPRESSION: No active cardiopulmonary disease. Electronically Signed   By: Duanne Guess D.O.   On: 02/28/2020 12:46    EKG: Independently reviewed.   Assessment/Plan  Sepsis with unknown source-pt is immunocompromised on hydroxycholorquine  LP obtained to assess for meningitis given patient endorsed headache, photophobia and neck stiffness. IV vancomycin, meropenem and acyclovir was started pending fluid studies.  IV antibiotics were discussed with pharmacy given patient's numerous allergies. Blood culture pending Could consider back x-ray if CSF and blood culture work-up returned negative since she was complaining of slighly worsening back pain.  Hypotension likely secondary to sepsis Has received 2 L of IV normal saline bolus in the ED and now is normotensive Continue to monitor Hold all antihypertensives  Acute kidney injury  Monitor after fluids Avoid nephrotoxic agents   Rheumatoid arthritis Hold hydroxychloroquine during infection  COPD/asthma continue  bronchodilators  GERD continue PPI  Hyperlipidemia Continue statin  DVT prophylaxis: SCDs Code Status: Full Family Communication: Plan discussed with patient at bedside  disposition Plan: Home with at least 2 midnight stays  Consults called:  Admission status: inpatient  Yukari Flax T Ellijah Leffel DO [facility]   If 7PM-7AM, please contact night-coverage [email or web address]   02/29/2020, 12:55 AM

## 2020-02-29 NOTE — ED Notes (Signed)
This RN attempted to call report at this time.  

## 2020-03-01 DIAGNOSIS — R509 Fever, unspecified: Secondary | ICD-10-CM | POA: Diagnosis not present

## 2020-03-01 LAB — CREATININE, SERUM
Creatinine, Ser: 1.16 mg/dL — ABNORMAL HIGH (ref 0.44–1.00)
GFR calc Af Amer: 56 mL/min — ABNORMAL LOW (ref >60)
GFR calc non Af Amer: 49 mL/min — ABNORMAL LOW (ref >60)

## 2020-03-01 LAB — CRYPTOCOCCAL ANTIGEN: Crypto Ag: NEGATIVE

## 2020-03-01 LAB — GLUCOSE, CAPILLARY: Glucose-Capillary: 160 mg/dL — ABNORMAL HIGH (ref 70–99)

## 2020-03-01 MED ORDER — POTASSIUM CHLORIDE CRYS ER 10 MEQ PO TBCR
10.0000 meq | EXTENDED_RELEASE_TABLET | Freq: Every day | ORAL | Status: DC
  Administered 2020-03-01 – 2020-03-06 (×6): 10 meq via ORAL
  Filled 2020-03-01 (×6): qty 1

## 2020-03-01 MED ORDER — CALCIUM CARBONATE ANTACID 500 MG PO CHEW
500.0000 mg | CHEWABLE_TABLET | Freq: Every day | ORAL | Status: DC
  Administered 2020-03-02 – 2020-03-06 (×5): 500 mg via ORAL
  Filled 2020-03-01 (×6): qty 3

## 2020-03-01 MED ORDER — DIGOXIN 0.25 MG/ML IJ SOLN
0.2500 mg | Freq: Once | INTRAMUSCULAR | Status: AC
  Administered 2020-03-02: 0.25 mg via INTRAVENOUS
  Filled 2020-03-01 (×2): qty 2

## 2020-03-01 MED ORDER — ADULT MULTIVITAMIN W/MINERALS CH
1.0000 | ORAL_TABLET | Freq: Every day | ORAL | Status: DC
  Administered 2020-03-01 – 2020-03-06 (×6): 1 via ORAL
  Filled 2020-03-01 (×6): qty 1

## 2020-03-01 MED ORDER — SODIUM CHLORIDE 0.9 % IV SOLN: INTRAVENOUS | Status: DC

## 2020-03-01 MED ORDER — METOPROLOL TARTRATE 25 MG PO TABS
25.0000 mg | ORAL_TABLET | Freq: Two times a day (BID) | ORAL | Status: DC
  Administered 2020-03-01 – 2020-03-06 (×10): 25 mg via ORAL
  Filled 2020-03-01 (×10): qty 1

## 2020-03-01 MED ORDER — SODIUM CHLORIDE 0.9 % IV BOLUS
500.0000 mL | Freq: Once | INTRAVENOUS | Status: AC
  Administered 2020-03-01: 500 mL via INTRAVENOUS

## 2020-03-01 MED ORDER — DIGOXIN 0.25 MG/ML IJ SOLN
0.5000 mg | INTRAMUSCULAR | Status: AC
  Administered 2020-03-01: 20:00:00 0.5 mg via INTRAVENOUS
  Filled 2020-03-01: qty 2

## 2020-03-01 MED ORDER — VANCOMYCIN HCL IN DEXTROSE 1-5 GM/200ML-% IV SOLN
1000.0000 mg | INTRAVENOUS | Status: DC
  Administered 2020-03-01: 15:00:00 1000 mg via INTRAVENOUS
  Filled 2020-03-01 (×2): qty 200

## 2020-03-01 MED ORDER — MAGNESIUM GLUCONATE 500 MG PO TABS
500.0000 mg | ORAL_TABLET | Freq: Every day | ORAL | Status: DC
  Administered 2020-03-01 – 2020-03-05 (×5): 500 mg via ORAL
  Filled 2020-03-01 (×8): qty 1

## 2020-03-01 MED ORDER — SODIUM CHLORIDE 0.9 % IV SOLN
INTRAVENOUS | Status: DC | PRN
  Administered 2020-03-01: 250 mL via INTRAVENOUS

## 2020-03-01 MED ORDER — FLUTICASONE PROPIONATE 50 MCG/ACT NA SUSP
1.0000 | Freq: Every day | NASAL | Status: DC | PRN
  Filled 2020-03-01: qty 16

## 2020-03-01 MED ORDER — BENZONATATE 100 MG PO CAPS
100.0000 mg | ORAL_CAPSULE | Freq: Three times a day (TID) | ORAL | Status: DC | PRN
  Administered 2020-03-01 – 2020-03-06 (×9): 100 mg via ORAL
  Filled 2020-03-01 (×9): qty 1

## 2020-03-01 NOTE — Progress Notes (Signed)
Patient is feeling better. BP low but improving slowly. Most recent BP is 88/69. Dr Allena Katz updated. Bolus is complete Order received to continue NS at 149ml/hr

## 2020-03-01 NOTE — Progress Notes (Signed)
[facility]  - [place] [facility]   PATIENT NAME: Misty Briggs    MR#:  [id number]  DATE OF BIRTH:  Oct 21, 1951  SUBJECTIVE:  patient out in the chair earlier. Trying to eat some. Not much appetite. No fever this morning. Feels better than yesterday. No nausea vomiting abdominal pain or diarrhea no recent travel  REVIEW OF SYSTEMS:   Review of Systems  Constitutional: Positive for chills, fever and malaise/fatigue. Negative for weight loss.  HENT: Negative for ear discharge, ear pain and nosebleeds.   Eyes: Negative for blurred vision, pain and discharge.  Respiratory: Negative for sputum production, shortness of breath, wheezing and stridor.   Cardiovascular: Negative for chest pain, palpitations, orthopnea and PND.  Gastrointestinal: Negative for abdominal pain, diarrhea, nausea and vomiting.  Genitourinary: Negative for frequency and urgency.  Musculoskeletal: Positive for back pain and joint pain.  Neurological: Negative for sensory change, speech change, focal weakness and weakness.  Psychiatric/Behavioral: Negative for depression and hallucinations. The patient is not nervous/anxious.    Tolerating Diet:some Tolerating PT:   DRUG ALLERGIES:   Allergies  Allergen Reactions  . Doxycycline Other (See Comments)    Thrush  . Penicillins Hives, Palpitations and Rash  . Azithromycin Other (See Comments)    Thrush  . Levofloxacin Other (See Comments)    Joint pain/muscle pain  . Sulfa Antibiotics Rash    VITALS:  Blood pressure 98/66, pulse 96, temperature 98.2 F (36.8 C), temperature source Oral, resp. rate 20, height 5\' 5"  (1.651 m), weight 73.5 kg, SpO2 98 %.  PHYSICAL EXAMINATION:   Physical Exam  GENERAL:  69 y.o.-year-old patient lying in the bed with no acute distress. weak EYES: Pupils equal, round, reactive to light and accommodation. No scleral icterus.   HEENT: Head atraumatic, normocephalic. Oropharynx and nasopharynx clear.  NECK:   Supple, no jugular venous distention. No thyroid enlargement, no tenderness.  LUNGS: Normal breath sounds bilaterally, no wheezing, rales, rhonchi. No use of accessory muscles of respiration.  CARDIOVASCULAR: S1, S2 normal. No murmurs, rubs, or gallops. Tachycardia ABDOMEN: Soft, nontender, nondistended. Bowel sounds present. No organomegaly or mass.  EXTREMITIES: No cyanosis, clubbing or edema b/l.    NEUROLOGIC: Cranial nerves II through XII are intact. No focal Motor or sensory deficits b/l.   PSYCHIATRIC:  patient is alert and oriented x 3.  SKIN: No obvious rash, lesion, or ulcer.   LABORATORY PANEL:  CBC Recent Labs  Lab 02/29/20 0538  WBC 11.2*  HGB 10.9*  HCT 33.6*  PLT 218    Chemistries  Recent Labs  Lab 02/29/20 0538 02/29/20 0538 02/29/20 1530 03/01/20 0606  NA 134*  --   --   --   K 3.1*  --   --   --   CL 103  --   --   --   CO2 20*  --   --   --   GLUCOSE 121*  --   --   --   BUN 19  --   --   --   CREATININE 1.34*   < >  --  1.16*  CALCIUM 8.3*  --   --   --   AST  --   --  28  --   ALT  --   --  23  --   ALKPHOS  --   --  77  --   BILITOT  --   --  1.0  --    < > = values  in this interval not displayed.   Cardiac Enzymes No results for input(s): TROPONINI in the last 168 hours. RADIOLOGY:  No results found. ASSESSMENT AND PLAN:  Misty Briggs is a 69 y.o. female with medical history significant for asthma, COPD, hypertension, GERD, hyperlipidemia and Covid infection about 5 months ago who presents with concerns of fever and generalized fatigue. Her symptoms started acutely last night when she developed fever up to 103 and has noted generalized weakness.  She has a mild cough but denies any runny nose.  Sepsis with unknown source Acute Kidney injury/ATN  -pt is immunocompromised on hydroxycholorquine  -came in with fever 103, tachycardia, tachypnea and elevated white count. -Pro calcitonin 0.39 -received broad-spectrum antibiotic  -discussed  with Dr. Delaine Lame ID. - Continue IV Rocephin till blood culture urine culture back. -Possibly could be viral. -LP obtained to assess for meningitis given patient endorsed headache, photophobia and neck stiffness.-- Results so far negative. -received IV vancomycin, meropenem and acyclovir  In the ER -Blood culture negative so far -CXR negative -UA not impressive for UTI  Hypotension likely secondary to sepsis Has received 2 L of IV normal saline bolus in the ED and now is normotensive Continue to monitor Hold all antihypertensives -became hypotensive rapid response was called. Her initial heart rate was in the 120 she received a small dose of metoprolol 25 mg her home dose and blood pressure dropped 60/49. Received bolus of 500 mL normal saline much improved blood pressure continue IV fluids hold antihypertensives. -Cardiology consultation if persistent tachycardia does not improve  Acute kidney injury  Monitor after fluids Avoid nephrotoxic agents  -came in with creat 1.18--1.34  Rheumatoid arthritis Hold hydroxychloroquine during infection  COPD/asthma continue bronchodilators  GERD continue PPI  Hyperlipidemia Continue statin  DVT prophylaxis: SCDs Code Status: Full Family Communication: Plan discussed with patient at bedside  disposition Plan: Home with at least 2 midnight stays  -patient continues to have high-grade fever. Workup still pending. Getting IV antibiotics. Patient became hypotensive. Likely will need to stay over the weekend. Consults called: ID  TOTAL TIME TAKING CARE OF THIS PATIENT: *30* minutes.  >50% time spent on counselling and coordination of care  Note: This dictation was prepared with Dragon dictation along with smaller phrase technology. Any transcriptional errors that result from this process are unintentional.  [name] M.D    [facility]   CC: Primary care physician; Maryland Pink, MDPatient ID: Misty Briggs, female    DOB: September 09, 1951, 69 y.o.   MRN: [id number]

## 2020-03-01 NOTE — Progress Notes (Signed)
Pharmacy Antibiotic Note  Misty Briggs is a 69 y.o. female admitted on 02/28/2020 with meningitis.  Pharmacy has been consulted for vanc/meropenem/acyclovir dosing.  Plan: Patient received vanc 1.5g IV load in ED. Scr trending up 1.18 > 1.34. Will dose by levels. Ordered vancomycin random at 1800 for today. Follow up with creatinine with AM labs.   Scr improved from 1.34>1.16 - will start dosing  Vancomycin 1000 mg IV Q 24 hrs. Goal AUC 400-550. Expected AUC: 477 SCr used: 1.16   Height: 5\' 5"  (165.1 cm) Weight: 73.5 kg (162 lb 0.6 oz) IBW/kg (Calculated) : 57  Temp (24hrs), Avg:99.4 F (37.4 C), Min:98.1 F (36.7 C), Max:103.3 F (39.6 C)  Recent Labs  Lab 02/28/20 1207 02/28/20 1755 02/29/20 0538 02/29/20 1754 03/01/20 0606  WBC 12.6*  --  11.2*  --   --   CREATININE 1.18*  --  1.34*  --  1.16*  LATICACIDVEN  --  1.4  --   --   --   VANCORANDOM  --   --   --  7  --     Estimated Creatinine Clearance: 47.3 mL/min (A) (by C-G formula based on SCr of 1.16 mg/dL (H)).    Allergies  Allergen Reactions  . Doxycycline Other (See Comments)    Thrush  . Penicillins Hives, Palpitations and Rash  . Azithromycin Other (See Comments)    Thrush  . Levofloxacin Other (See Comments)    Joint pain/muscle pain  . Sulfa Antibiotics Rash   Thank you for allowing pharmacy to be a part of this patient's care.  05/01/20, PharmD, BCPS Clinical Pharmacist 03/01/2020 1:08 PM

## 2020-03-01 NOTE — Significant Event (Signed)
Rapid Response Event Note  Overview: Time Called: 1036 Arrival Time: 1038 Event Type: Hypotension  Initial Focused Assessment: called for RR on patient hypotensive. Pt laying in recliner, dizzy, "fuzzy vision", and cool and clammy. BP 60's/40   Interventions: Dr Enedina Finner at bedside, ordered bolus. 500 ml given and BP up to 80's over 60's- see flowsheets for details. Pt reports feeling better.  Plan of Care (if not transferred): Erika patients RN to call if further assistance needed.  Event Summary: Name of Physician Notified: Dr Enedina Finner at 1036    at    Outcome: Stayed in room and stabalized  Event End Time: 1100  Misty Briggs A

## 2020-03-01 NOTE — Progress Notes (Signed)
Patient called out at 1035 to say that she didn't feel good and she was having a hard time seeing. VS low, BP 60/40, Rapid response called. Dr Allena Katz and rapid response team came. Order received from Dr Allena Katz for a 500 ml bolus of NS. BP is slowly improving. Currently 81/53

## 2020-03-01 NOTE — Progress Notes (Signed)
VS 102.8, resp 26, heart rate 131, afib, BP 106/86. Dr patel notified. Order received for digoxin. Pt will receive a loading dose of dig now then a repeat dose at 0115 and 0715 on 03/02/20. Patient is not to receive metoprolol tonight. Tylenol given

## 2020-03-02 ENCOUNTER — Inpatient Hospital Stay: Payer: Medicare Other

## 2020-03-02 DIAGNOSIS — R509 Fever, unspecified: Secondary | ICD-10-CM | POA: Diagnosis not present

## 2020-03-02 LAB — BASIC METABOLIC PANEL
Anion gap: 6 (ref 5–15)
BUN: 19 mg/dL (ref 8–23)
CO2: 22 mmol/L (ref 22–32)
Calcium: 8 mg/dL — ABNORMAL LOW (ref 8.9–10.3)
Chloride: 105 mmol/L (ref 98–111)
Creatinine, Ser: 1.17 mg/dL — ABNORMAL HIGH (ref 0.44–1.00)
GFR calc Af Amer: 56 mL/min — ABNORMAL LOW (ref >60)
GFR calc non Af Amer: 48 mL/min — ABNORMAL LOW (ref >60)
Glucose, Bld: 136 mg/dL — ABNORMAL HIGH (ref 70–99)
Potassium: 4.6 mmol/L (ref 3.5–5.1)
Sodium: 133 mmol/L — ABNORMAL LOW (ref 135–145)

## 2020-03-02 LAB — CBC WITH DIFFERENTIAL/PLATELET
Abs Immature Granulocytes: 0.03 10*3/uL (ref 0.00–0.07)
Basophils Absolute: 0 10*3/uL (ref 0.0–0.1)
Basophils Relative: 0 %
Eosinophils Absolute: 0.1 10*3/uL (ref 0.0–0.5)
Eosinophils Relative: 2 %
HCT: 31.5 % — ABNORMAL LOW (ref 36.0–46.0)
Hemoglobin: 10.1 g/dL — ABNORMAL LOW (ref 12.0–15.0)
Immature Granulocytes: 0 %
Lymphocytes Relative: 8 %
Lymphs Abs: 0.7 10*3/uL (ref 0.7–4.0)
MCH: 29.4 pg (ref 26.0–34.0)
MCHC: 32.1 g/dL (ref 30.0–36.0)
MCV: 91.6 fL (ref 80.0–100.0)
Monocytes Absolute: 0.5 10*3/uL (ref 0.1–1.0)
Monocytes Relative: 5 %
Neutro Abs: 7.5 10*3/uL (ref 1.7–7.7)
Neutrophils Relative %: 85 %
Platelets: 206 10*3/uL (ref 150–400)
RBC: 3.44 MIL/uL — ABNORMAL LOW (ref 3.87–5.11)
RDW: 13.9 % (ref 11.5–15.5)
WBC: 8.9 10*3/uL (ref 4.0–10.5)
nRBC: 0 % (ref 0.0–0.2)

## 2020-03-02 LAB — HEPATIC FUNCTION PANEL
ALT: 73 U/L — ABNORMAL HIGH (ref 0–44)
AST: 71 U/L — ABNORMAL HIGH (ref 15–41)
Albumin: 2.7 g/dL — ABNORMAL LOW (ref 3.5–5.0)
Alkaline Phosphatase: 153 U/L — ABNORMAL HIGH (ref 38–126)
Bilirubin, Direct: 0.3 mg/dL — ABNORMAL HIGH (ref 0.0–0.2)
Indirect Bilirubin: 0.6 mg/dL (ref 0.3–0.9)
Total Bilirubin: 0.9 mg/dL (ref 0.3–1.2)
Total Protein: 5.8 g/dL — ABNORMAL LOW (ref 6.5–8.1)

## 2020-03-02 LAB — FERRITIN: Ferritin: 525 ng/mL — ABNORMAL HIGH (ref 11–307)

## 2020-03-02 LAB — PROCALCITONIN: Procalcitonin: 2.48 ng/mL

## 2020-03-02 LAB — CORTISOL: Cortisol, Plasma: 23.9 ug/dL

## 2020-03-02 LAB — TSH: TSH: 1.946 u[IU]/mL (ref 0.350–4.500)

## 2020-03-02 LAB — MRSA PCR SCREENING: MRSA by PCR: NEGATIVE

## 2020-03-02 MED ORDER — IOHEXOL 9 MG/ML PO SOLN
500.0000 mL | ORAL | Status: AC
  Administered 2020-03-02 (×2): 500 mL via ORAL

## 2020-03-02 MED ORDER — ENOXAPARIN SODIUM 40 MG/0.4ML ~~LOC~~ SOLN
40.0000 mg | SUBCUTANEOUS | Status: DC
  Administered 2020-03-02 – 2020-03-06 (×5): 40 mg via SUBCUTANEOUS
  Filled 2020-03-02 (×5): qty 0.4

## 2020-03-02 MED ORDER — SODIUM CHLORIDE 0.9 % IV SOLN
100.0000 mg | Freq: Two times a day (BID) | INTRAVENOUS | Status: DC
  Administered 2020-03-02 – 2020-03-04 (×4): 100 mg via INTRAVENOUS
  Filled 2020-03-02 (×5): qty 100

## 2020-03-02 MED ORDER — IOHEXOL 300 MG/ML  SOLN
100.0000 mL | Freq: Once | INTRAMUSCULAR | Status: AC | PRN
  Administered 2020-03-02: 100 mL via INTRAVENOUS

## 2020-03-02 MED ORDER — TRAMADOL HCL 50 MG PO TABS
50.0000 mg | ORAL_TABLET | Freq: Three times a day (TID) | ORAL | Status: DC | PRN
  Administered 2020-03-02: 50 mg via ORAL
  Filled 2020-03-02 (×2): qty 1

## 2020-03-02 NOTE — Progress Notes (Signed)
Patient ID: Misty Briggs, female   DOB: [birth year], 69 y.o.   MRN: [id number]  d/w Dr Montez Hageman D/c meropenem Start IV doxy Check urine leionella ag, CMV dna PCR quantitative and Rheumatology to see tomorrow

## 2020-03-02 NOTE — Progress Notes (Signed)
[facility] NAME: Misty Briggs    MR#:  [id number]  DATE OF BIRTH:  1951/09/09  SUBJECTIVE:  continues with high-grade fever. Had temperature of 102.2 this morning. Feels drained out and very weak. No vomiting diarrhea. Some back pain. No skin rash  REVIEW OF SYSTEMS:   Review of Systems  Constitutional: Positive for chills, fever and malaise/fatigue. Negative for weight loss.  HENT: Negative for ear discharge, ear pain and nosebleeds.   Eyes: Negative for blurred vision, pain and discharge.  Respiratory: Negative for sputum production, shortness of breath, wheezing and stridor.   Cardiovascular: Negative for chest pain, palpitations, orthopnea and PND.  Gastrointestinal: Negative for abdominal pain, diarrhea, nausea and vomiting.  Genitourinary: Negative for frequency and urgency.  Musculoskeletal: Positive for back pain and joint pain.  Neurological: Negative for sensory change, speech change, focal weakness and weakness.  Psychiatric/Behavioral: Negative for depression and hallucinations. The patient is not nervous/anxious.    Tolerating Diet:some Tolerating PT:   DRUG ALLERGIES:   Allergies  Allergen Reactions  . Doxycycline Other (See Comments)    Thrush  . Penicillins Hives, Palpitations and Rash  . Azithromycin Other (See Comments)    Thrush  . Levofloxacin Other (See Comments)    Joint pain/muscle pain  . Sulfa Antibiotics Rash    VITALS:  Blood pressure (!) 157/78, pulse 80, temperature 98.5 F (36.9 C), temperature source Oral, resp. rate (!) 24, height '5\' 5"'  (1.651 m), weight 73.5 kg, SpO2 95 %.  PHYSICAL EXAMINATION:   Physical Exam  GENERAL:  69 y.o.-year-old patient lying in the bed with no acute distress. weak EYES: Pupils equal, round, reactive to light and accommodation. No scleral icterus.   HEENT: Head atraumatic, normocephalic. Oropharynx and nasopharynx clear.  NECK:  Supple, no jugular venous  distention. No thyroid enlargement, no tenderness.  LUNGS: Normal breath sounds bilaterally, no wheezing, rales, rhonchi. No use of accessory muscles of respiration.  CARDIOVASCULAR: S1, S2 normal. No murmurs, rubs, or gallops. Tachycardia ABDOMEN: Soft, nontender, nondistended. Bowel sounds present. No organomegaly or mass.  EXTREMITIES: No cyanosis, clubbing or edema b/l.    NEUROLOGIC: Cranial nerves II through XII are intact. No focal Motor or sensory deficits b/l.   PSYCHIATRIC:  patient is alert and oriented x 3.  SKIN: No obvious rash, lesion, or ulcer.   LABORATORY PANEL:  CBC Recent Labs  Lab 03/02/20 1033  WBC 8.9  HGB 10.1*  HCT 31.5*  PLT 206    Chemistries  Recent Labs  Lab 03/02/20 0607  NA 133*  K 4.6  CL 105  CO2 22  GLUCOSE 136*  BUN 19  CREATININE 1.17*  CALCIUM 8.0*  AST 71*  ALT 73*  ALKPHOS 153*  BILITOT 0.9   Cardiac Enzymes No results for input(s): TROPONINI in the last 168 hours. RADIOLOGY:  CT CERVICAL SPINE WO CONTRAST  Result Date: 03/02/2020 CLINICAL DATA:  Fever of unknown origin, history of surgery EXAM: CT CERVICAL SPINE WITHOUT CONTRAST TECHNIQUE: Multidetector CT imaging of the cervical spine was performed without intravenous contrast. Multiplanar CT image reconstructions were also generated. COMPARISON:  None. FINDINGS: Alignment: Anteroposterior alignment is maintained. Skull base and vertebrae: Postoperative changes of anterior fusion at C6-C7 with plate and screw fixation and interbody graft, which appears incorporated. There is associated streak artifact. No evidence of loosening. Vertebral body heights are maintained. Soft tissues and spinal canal: No prevertebral fluid or swelling. No visible epidural collection. Disc levels: C2-C3:  Right greater than left facet hypertrophy. No significant stenosis. C3-C4: Small disc bulge with endplate osteophytes and right greater than left facet and uncovertebral hypertrophy. No significant canal or  left foraminal stenosis. Right foraminal narrowing is present. C4-C5: Small disc bulge with endplate osteophytes uncovertebral and facet hypertrophy. No significant canal stenosis. There is left greater than right foraminal narrowing. C5-C6: Small disc bulge with endplate osteophytes and facet and uncovertebral hypertrophy. No significant canal or left foraminal stenosis. Right foraminal narrowing is present. C6-C7: Operative level. Small endplate osteophytes and facet hypertrophy. No significant stenosis. C7-T1:  Facet hypertrophy.  No significant stenosis. Upper chest: Negative. Other: None. IMPRESSION: No evidence of infectious process involving the cervical spine. Electronically Signed   By: Misty Briggs M.D.   On: 03/02/2020 11:45   CT L-SPINE NO CHARGE  Result Date: 03/02/2020 CLINICAL DATA:  Fever of unknown origin EXAM: CT LUMBAR SPINE WITHOUT CONTRAST TECHNIQUE: Multidetector CT imaging of the lumbar spine was performed without intravenous contrast administration. Multiplanar CT image reconstructions were also generated. COMPARISON:  None. FINDINGS: Segmentation: 5 lumbar type vertebrae. Alignment: Mild retrolisthesis at L3-L4. Vertebrae: No compression deformity. Mild degenerative endplate irregularity. No destructive osseous lesion. Paraspinal and other soft tissues: Unremarkable. Refer to dedicated body imaging for better evaluation of extra-spinal findings. Disc levels: L1-L2:  No significant stenosis. L2-L3: Probable small disc bulge with mild endplate osteophytic ridging. Facet hypertrophy. No significant stenosis. L3-L4: Probable disc bulge with endplate osteophytic ridging. Facet hypertrophy. Mild canal stenosis. Right foraminal stenosis is present. L4-L5: Probable small disc bulge with mild endplate osteophytic ridging. Facet hypertrophy. No significant stenosis. L5-S1: Probable small disc bulge with mild endplate osteophytic ridging. Facet hypertrophy. No significant canal stenosis. Left  foraminal stenosis is present. IMPRESSION: No evidence of infectious process of the lumbar spine. Electronically Signed   By: Misty Briggs M.D.   On: 03/02/2020 11:57   ASSESSMENT AND PLAN:  Misty Briggs is a 69 y.o. female with medical history significant for asthma, COPD, hypertension, GERD, hyperlipidemia and Covid infection about 5 months ago who presents with concerns of fever and generalized fatigue. Her symptoms started acutely last night when she developed fever up to 103 and has noted generalized weakness.  She has a mild cough but denies any runny nose.  Sepsis with unknown source/Febrile illness (infectitous vs autoimmune) Acute Kidney injury/ATN  -pt is immunocompromised on hydroxycholorquine  -came in with fever 103, tachycardia, tachypnea and elevated white count. -Pro calcitonin 0.39 -received broad-spectrum antibiotic  -discussed with Dr. Delaine Lame ID. - pt on IV Vanc and meropenem--Vanc d/ced -CSF culture negative -received IV vancomycin, meropenem and acyclovir  In the ER -Blood culture negative so far -CXR negative -UA not impressive for UTI -ESR 53, CRP 27.7 -Crypto neg -Fungal antibody pending -Ferritin 525 -patient has CT scan of the chest, abdomen, pelvis, cervical and lumbar spine--- no source of infection identified -consider rheumatology consultation  Hypotension likely secondary to sepsis -became hypotensive rapid response was called. Her initial heart rate was in the 120 she received a small dose of metoprolol 25 mg her home dose and blood pressure dropped 60/49. Received bolus of 500 mL normal saline much improved blood pressure continue IV fluids hold antihypertensives. -Cardiology consultation if persistent tachycardia does not improve -received IV digoxin load for tachycardia--could not given BB or CCB due to soft bp  Acute kidney injury  Monitor after fluids Avoid nephrotoxic agents  -came in with creat 1.18--1.34  Rheumatoid  arthritis Hold hydroxychloroquine during infection  COPD/asthma continue bronchodilators  GERD continue PPI  Hyperlipidemia Continue statin  DVT prophylaxis: SCDs Code Status: Full Family Communication: Plan discussed with husband on the phone  disposition Plan: Home with at least 2 midnight stays  -patient continues to have high-grade fever. Workup still pending. Getting IV antibiotics. Patient became hypotensive. Will need rheum evaluation  Consults called: ID  TOTAL TIME TAKING CARE OF THIS PATIENT: *30* minutes.  >50% time spent on counselling and coordination of care  Note: This dictation was prepared with Dragon dictation along with smaller phrase technology. Any transcriptional errors that result from this process are unintentional.  [name] M.D    [facility]   CC: Primary care physician; [place] Pink, MDPatient ID: Misty Briggs, female   DOB: [birth year], 69 y.o.   MRN: [id number]

## 2020-03-02 NOTE — Progress Notes (Signed)
ID Chart reviewed remotely and spoke to patient and Dr.Patel Continues with fever Feeling week Had dropped BP yesterday and recovered with fluids Patient Vitals for the past 24 hrs:  BP Temp Temp src Pulse Resp SpO2  03/02/20 0920 114/79 98.3 F (36.8 C) Oral 88 20 95 %  03/02/20 0815 -- 98.9 F (37.2 C) Oral 85 -- 98 %  03/02/20 0645 -- (!) 102.2 F (39 C) -- -- -- --  03/02/20 0522 (!) 142/72 (!) 100.5 F (38.1 C) Oral (!) 105 (!) 24 99 %  03/02/20 0349 (!) 153/74 -- -- -- (!) 28 --  03/02/20 0347 -- -- -- (!) 108 -- 98 %  03/02/20 0337 (!) 169/99 99.8 F (37.7 C) Oral (!) 105 (!) 22 99 %  03/01/20 2331 113/76 97.7 F (36.5 C) Oral 74 20 100 %  03/01/20 2239 105/74 97.6 F (36.4 C) Oral 93 19 99 %  03/01/20 2124 (!) 115/57 98.8 F (37.1 C) Oral 86 20 98 %  03/01/20 2056 -- -- -- -- -- 97 %  03/01/20 2035 107/81 100.3 F (37.9 C) Oral 84 18 98 %  03/01/20 1852 106/86 (!) 102.8 F (39.3 C) Oral (!) 115 (!) 26 95 %  03/01/20 1523 -- -- -- 96 -- --  03/01/20 1518 98/66 -- -- (!) 101 -- --  03/01/20 1229 (!) 99/53 98.2 F (36.8 C) Oral 82 20 98 %  03/01/20 1123 (!) 88/69 -- -- 88 -- 99 %  03/01/20 1059 (!) 91/57 -- -- -- -- --  03/01/20 1057 (!) 84/59 -- -- 78 -- 99 %  03/01/20 1048 (!) 70/56 -- -- 81 -- 99 %  03/01/20 1043 (!) 61/50 98.1 F (36.7 C) Oral (!) 51 20 (!) 80 %   Pt feels weak Headache better Neck pain + Lumbar pian at the site of LP Baseline cough No sore throat No pain abdomen No diarrhea No rash  Labs ESR 53 CRP 27.7 procal 0.38 and lactate was 1.4 on admission Hb 10.9 PLT 218 Csf N Blood culture N CSF culture N Crypto Neg Beta D glucan pending Fungal antibodies -P   Impression/Recommendation  FUO- Infection VS autoimmune currently on vanco and meropenem with no relief- hence will stop vanco as concern for renal Will get serum ferritin LFT CBC CT chest and abd CT lumbar and cervical spine Rh factor and anticcp antibodies If  imaging normal - will Dc meropenem as well

## 2020-03-03 DIAGNOSIS — R0603 Acute respiratory distress: Secondary | ICD-10-CM

## 2020-03-03 LAB — CSF CULTURE W GRAM STAIN: Culture: NO GROWTH

## 2020-03-03 LAB — PROCALCITONIN: Procalcitonin: 1.1 ng/mL

## 2020-03-03 LAB — PROTEIN / CREATININE RATIO, URINE
Creatinine, Urine: 87 mg/dL
Protein Creatinine Ratio: 1.61 mg/mg{Cre} — ABNORMAL HIGH (ref 0.00–0.15)
Total Protein, Urine: 140 mg/dL

## 2020-03-03 LAB — RHEUMATOID FACTOR: Rheumatoid fact SerPl-aCnc: 18.2 IU/mL — ABNORMAL HIGH (ref 0.0–13.9)

## 2020-03-03 LAB — CYCLIC CITRUL PEPTIDE ANTIBODY, IGG/IGA: CCP Antibodies IgG/IgA: 2 units (ref 0–19)

## 2020-03-03 MED ORDER — SODIUM CHLORIDE 0.9% FLUSH: 10.0000 mL | INTRAVENOUS | Status: DC | PRN

## 2020-03-03 MED ORDER — SODIUM CHLORIDE 0.9% FLUSH
10.0000 mL | Freq: Two times a day (BID) | INTRAVENOUS | Status: DC
  Administered 2020-03-04 – 2020-03-06 (×6): 10 mL

## 2020-03-03 NOTE — Care Management Important Message (Signed)
Important Message  Patient Details  Name: Misty Briggs MRN: [id number] Date of Birth: [birth year]   Medicare Important Message Given:  Yes     Johnell Comings 03/03/2020, 12:50 PM

## 2020-03-03 NOTE — Progress Notes (Signed)
[facility] [place] NAME: Misty Briggs    MR#:  [id number]  DATE OF BIRTH:  09-17-1951  SUBJECTIVE:  tmax 100.3 on 4/11 complaining of shortness of breath.  REVIEW OF SYSTEMS:   Review of Systems  Constitutional: Positive for chills, fever and malaise/fatigue. Negative for weight loss.  HENT: Negative for ear discharge, ear pain and nosebleeds.   Eyes: Negative for blurred vision, pain and discharge.  Respiratory: Negative for sputum production, shortness of breath, wheezing and stridor.   Cardiovascular: Negative for chest pain, palpitations, orthopnea and PND.  Gastrointestinal: Negative for abdominal pain, diarrhea, nausea and vomiting.  Genitourinary: Negative for frequency and urgency.  Musculoskeletal: Positive for back pain and joint pain.  Neurological: Negative for sensory change, speech change, focal weakness and weakness.  Psychiatric/Behavioral: Negative for depression and hallucinations. The patient is not nervous/anxious.    Tolerating Diet:some Tolerating PT:   DRUG ALLERGIES:   Allergies  Allergen Reactions  . Doxycycline Other (See Comments)    Thrush  . Penicillins Hives, Palpitations and Rash  . Azithromycin Other (See Comments)    Thrush  . Levofloxacin Other (See Comments)    Joint pain/muscle pain  . Sulfa Antibiotics Rash    VITALS:  Blood pressure (!) 158/74, pulse 90, temperature 98.6 F (37 C), temperature source Oral, resp. rate 20, height '5\' 5"'  (1.651 m), weight 73.5 kg, SpO2 100 %.  PHYSICAL EXAMINATION:   Physical Exam  GENERAL:  69 y.o.-year-old patient lying in the bed with no acute distress. weak EYES: Pupils equal, round, reactive to light and accommodation. No scleral icterus.   HEENT: Head atraumatic, normocephalic. Oropharynx and nasopharynx clear.  NECK:  Supple, no jugular venous distention. No thyroid enlargement, no tenderness.  LUNGS: Normal breath sounds bilaterally, no wheezing,  rales, rhonchi. No use of accessory muscles of respiration.  CARDIOVASCULAR: S1, S2 normal. No murmurs, rubs, or gallops.  ABDOMEN: Soft, nontender, nondistended. Bowel sounds present. No organomegaly or mass.  EXTREMITIES: No cyanosis, clubbing or edema b/l.    NEUROLOGIC: Cranial nerves II through XII are intact. No focal Motor or sensory deficits b/l.   PSYCHIATRIC:  patient is alert and oriented x 3.  SKIN: No obvious rash, lesion, or ulcer.   LABORATORY PANEL:  CBC Recent Labs  Lab 03/02/20 1033  WBC 8.9  HGB 10.1*  HCT 31.5*  PLT 206    Chemistries  Recent Labs  Lab 03/02/20 0607  NA 133*  K 4.6  CL 105  CO2 22  GLUCOSE 136*  BUN 19  CREATININE 1.17*  CALCIUM 8.0*  AST 71*  ALT 73*  ALKPHOS 153*  BILITOT 0.9   Cardiac Enzymes No results for input(s): TROPONINI in the last 168 hours. RADIOLOGY:  CT CHEST W CONTRAST  Result Date: 03/02/2020 CLINICAL DATA:  Fever of unknown origin. Malaise. History of neck surgery. Increasing shortness of breath. EXAM: CT CHEST, ABDOMEN, AND PELVIS WITH CONTRAST TECHNIQUE: Multidetector CT imaging of the chest, abdomen and pelvis was performed following the standard protocol during bolus administration of intravenous contrast. CONTRAST:  114m OMNIPAQUE IOHEXOL 300 MG/ML  SOLN COMPARISON:  Chest CTA 11/20/2019. FINDINGS: CT CHEST FINDINGS Cardiovascular: No significant vascular findings. No evidence of pulmonary embolism on nondedicated imaging. The heart size is normal. There is no pericardial effusion. Mediastinum/Nodes: There are no enlarged mediastinal, hilar or axillary lymph nodes. The thyroid gland, trachea and esophagus demonstrate no significant findings. Lungs/Pleura: Right greater than left dependent pleural effusions  have mildly enlarged. Increased dependent opacities in both lower lobes and in the right middle lobe likely represent atelectasis, and there is volume loss in the middle lobe. Scattered ground-glass opacities  elsewhere in the lungs are unchanged from the recent chest CT. Musculoskeletal/Chest wall: No chest wall mass or suspicious osseous findings. Postsurgical changes in the lower cervical spine, incompletely visualized. CT ABDOMEN AND PELVIS FINDINGS Hepatobiliary: The liver is normal in density without suspicious focal abnormality. Mild nonspecific pericholecystic fluid without significant gallbladder wall thickening or calcified gallstones. No biliary dilatation. Pancreas: Unremarkable. No pancreatic ductal dilatation or surrounding inflammatory changes. Spleen: Normal in size without focal abnormality. Adrenals/Urinary Tract: Both adrenal glands appear normal. Mild symmetric perinephric soft tissue stranding bilaterally. Tiny cyst in the lower pole of the left kidney. No evidence of urinary tract calculus or hydronephrosis. The bladder appears normal. Stomach/Bowel: No evidence of bowel wall thickening, distention or surrounding inflammatory change. The appendix is not clearly seen. There are no pericecal inflammatory changes to suggest appendicitis. Vascular/Lymphatic: There are no enlarged abdominal or pelvic lymph nodes. Aortic and branch vessel atherosclerosis without acute vascular findings. The portal, superior mesenteric and splenic veins are patent. There are multiple gonadal vein phleboliths bilaterally. Reproductive: The uterus and ovaries appear normal. No adnexal mass. Other: Mild generalized soft tissue edema with trace pelvic ascites. No focal extraluminal fluid collection. Musculoskeletal: No acute or significant osseous findings. Moderate lumbar spondylosis. IMPRESSION: 1. Mildly enlarged bilateral pleural effusions and increased dependent opacities in both lower lobes and in the right middle lobe, most consistent with atelectasis. Scattered ground-glass opacities elsewhere in the lungs are unchanged from recent chest CT. No focal airspace disease. 2. Mild nonspecific pericholecystic fluid without  significant gallbladder wall thickening or calcified gallstones. No biliary dilatation. 3. Mild generalized soft tissue edema with trace pelvic ascites. 4. Aortic Atherosclerosis (ICD10-I70.0). Electronically Signed   By: Richardean Sale M.D.   On: 03/02/2020 14:51   CT CERVICAL SPINE WO CONTRAST  Result Date: 03/02/2020 CLINICAL DATA:  Fever of unknown origin, history of surgery EXAM: CT CERVICAL SPINE WITHOUT CONTRAST TECHNIQUE: Multidetector CT imaging of the cervical spine was performed without intravenous contrast. Multiplanar CT image reconstructions were also generated. COMPARISON:  None. FINDINGS: Alignment: Anteroposterior alignment is maintained. Skull base and vertebrae: Postoperative changes of anterior fusion at C6-C7 with plate and screw fixation and interbody graft, which appears incorporated. There is associated streak artifact. No evidence of loosening. Vertebral body heights are maintained. Soft tissues and spinal canal: No prevertebral fluid or swelling. No visible epidural collection. Disc levels: C2-C3: Right greater than left facet hypertrophy. No significant stenosis. C3-C4: Small disc bulge with endplate osteophytes and right greater than left facet and uncovertebral hypertrophy. No significant canal or left foraminal stenosis. Right foraminal narrowing is present. C4-C5: Small disc bulge with endplate osteophytes uncovertebral and facet hypertrophy. No significant canal stenosis. There is left greater than right foraminal narrowing. C5-C6: Small disc bulge with endplate osteophytes and facet and uncovertebral hypertrophy. No significant canal or left foraminal stenosis. Right foraminal narrowing is present. C6-C7: Operative level. Small endplate osteophytes and facet hypertrophy. No significant stenosis. C7-T1:  Facet hypertrophy.  No significant stenosis. Upper chest: Negative. Other: None. IMPRESSION: No evidence of infectious process involving the cervical spine. Electronically Signed    By: Macy Mis M.D.   On: 03/02/2020 11:45   CT ABDOMEN PELVIS W CONTRAST  Result Date: 03/02/2020 CLINICAL DATA:  Fever of unknown origin. Malaise. History of neck surgery. Increasing shortness of breath.  EXAM: CT CHEST, ABDOMEN, AND PELVIS WITH CONTRAST TECHNIQUE: Multidetector CT imaging of the chest, abdomen and pelvis was performed following the standard protocol during bolus administration of intravenous contrast. CONTRAST:  147m OMNIPAQUE IOHEXOL 300 MG/ML  SOLN COMPARISON:  Chest CTA 11/20/2019. FINDINGS: CT CHEST FINDINGS Cardiovascular: No significant vascular findings. No evidence of pulmonary embolism on nondedicated imaging. The heart size is normal. There is no pericardial effusion. Mediastinum/Nodes: There are no enlarged mediastinal, hilar or axillary lymph nodes. The thyroid gland, trachea and esophagus demonstrate no significant findings. Lungs/Pleura: Right greater than left dependent pleural effusions have mildly enlarged. Increased dependent opacities in both lower lobes and in the right middle lobe likely represent atelectasis, and there is volume loss in the middle lobe. Scattered ground-glass opacities elsewhere in the lungs are unchanged from the recent chest CT. Musculoskeletal/Chest wall: No chest wall mass or suspicious osseous findings. Postsurgical changes in the lower cervical spine, incompletely visualized. CT ABDOMEN AND PELVIS FINDINGS Hepatobiliary: The liver is normal in density without suspicious focal abnormality. Mild nonspecific pericholecystic fluid without significant gallbladder wall thickening or calcified gallstones. No biliary dilatation. Pancreas: Unremarkable. No pancreatic ductal dilatation or surrounding inflammatory changes. Spleen: Normal in size without focal abnormality. Adrenals/Urinary Tract: Both adrenal glands appear normal. Mild symmetric perinephric soft tissue stranding bilaterally. Tiny cyst in the lower pole of the left kidney. No evidence of  urinary tract calculus or hydronephrosis. The bladder appears normal. Stomach/Bowel: No evidence of bowel wall thickening, distention or surrounding inflammatory change. The appendix is not clearly seen. There are no pericecal inflammatory changes to suggest appendicitis. Vascular/Lymphatic: There are no enlarged abdominal or pelvic lymph nodes. Aortic and branch vessel atherosclerosis without acute vascular findings. The portal, superior mesenteric and splenic veins are patent. There are multiple gonadal vein phleboliths bilaterally. Reproductive: The uterus and ovaries appear normal. No adnexal mass. Other: Mild generalized soft tissue edema with trace pelvic ascites. No focal extraluminal fluid collection. Musculoskeletal: No acute or significant osseous findings. Moderate lumbar spondylosis. IMPRESSION: 1. Mildly enlarged bilateral pleural effusions and increased dependent opacities in both lower lobes and in the right middle lobe, most consistent with atelectasis. Scattered ground-glass opacities elsewhere in the lungs are unchanged from recent chest CT. No focal airspace disease. 2. Mild nonspecific pericholecystic fluid without significant gallbladder wall thickening or calcified gallstones. No biliary dilatation. 3. Mild generalized soft tissue edema with trace pelvic ascites. 4. Aortic Atherosclerosis (ICD10-I70.0). Electronically Signed   By: WRichardean SaleM.D.   On: 03/02/2020 14:51   CT L-SPINE NO CHARGE  Result Date: 03/02/2020 CLINICAL DATA:  Fever of unknown origin EXAM: CT LUMBAR SPINE WITHOUT CONTRAST TECHNIQUE: Multidetector CT imaging of the lumbar spine was performed without intravenous contrast administration. Multiplanar CT image reconstructions were also generated. COMPARISON:  None. FINDINGS: Segmentation: 5 lumbar type vertebrae. Alignment: Mild retrolisthesis at L3-L4. Vertebrae: No compression deformity. Mild degenerative endplate irregularity. No destructive osseous lesion.  Paraspinal and other soft tissues: Unremarkable. Refer to dedicated body imaging for better evaluation of extra-spinal findings. Disc levels: L1-L2:  No significant stenosis. L2-L3: Probable small disc bulge with mild endplate osteophytic ridging. Facet hypertrophy. No significant stenosis. L3-L4: Probable disc bulge with endplate osteophytic ridging. Facet hypertrophy. Mild canal stenosis. Right foraminal stenosis is present. L4-L5: Probable small disc bulge with mild endplate osteophytic ridging. Facet hypertrophy. No significant stenosis. L5-S1: Probable small disc bulge with mild endplate osteophytic ridging. Facet hypertrophy. No significant canal stenosis. Left foraminal stenosis is present. IMPRESSION: No evidence of infectious process of  the lumbar spine. Electronically Signed   By: Macy Mis M.D.   On: 03/02/2020 11:57   ASSESSMENT AND PLAN:  OLUWATONI ROTUNNO is a 69 y.o. female with medical history significant for asthma, COPD, hypertension, GERD, hyperlipidemia and Covid infection about 5 months ago who presents with concerns of fever and generalized fatigue. Her symptoms started acutely last night when she developed fever up to 103 and has noted generalized weakness.  She has a mild cough but denies any runny nose.  Sepsis with unknown source/Febrile illness (infectitous vs autoimmune) Acute Kidney injury/ATN  -pt is immunocompromised from RA -came in with fever 103, tachycardia, tachypnea and elevated white count. -Pro calcitonin 0.39 -appreciate infectious disease input - pt on IV Vanc and meropenem--Vanc d/ced-- V doxycycline hundred milligrams BID -CSF culture negative -received IV vancomycin, meropenem and acyclovir  in the ER -Blood culture negative so far -CXR negative -UA not impressive for UTI -ESR 53, CRP 27.7 -Crypto neg -Fungal antibody pending -Ferritin 525 -patient has CT scan of the chest, abdomen, pelvis, cervical and lumbar spine--- no source of infection  identified -consider rheumatology consultation appreicated. 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 Recommends prednisone 60 mg daily if okay with ID.  Hypotension likely secondary to sepsis-- -resolved  Acute kidney injury  Monitor after fluids Avoid nephrotoxic agents  -came in with creat 1.18--1.34  Rheumatoid arthritis Hold hydroxychloroquine during infection  COPD/asthma continue bronchodilators  GERD continue PPI  Hyperlipidemia Continue statin  DVT prophylaxis: SCDs Code Status: Full Family Communication: Plan discussed with husband on the phone  disposition Plan: Home with at least 2 midnight stays  need to monitor her for fever. Currently afebrile. Home in 1 to 2 days. Consults called: ID, Rheumatology  TOTAL TIME TAKING CARE OF THIS PATIENT: *30* minutes.  >50% time spent on counselling and coordination of care  Note: This dictation was prepared with Dragon dictation along with smaller phrase technology. Any transcriptional errors that result from this process are unintentional.  [name] M.D    [facility]   CC: Primary care physician; [place] Pink, MDPatient ID: Misty Briggs, female   DOB: Jun 01, 1951, 69 y.o.   MRN: [id number]

## 2020-03-03 NOTE — Progress Notes (Signed)
ID Pt feeling fatigued Sob Wheezy Head ache better  Patient Vitals for the past 24 hrs:  BP Temp Temp src Pulse Resp SpO2  03/03/20 1926 (!) 179/92 98.3 F (36.8 C) Oral 96 (!) 22 99 %  03/03/20 1152 (!) 158/74 98.6 F (37 C) Oral 90 -- 100 %  03/03/20 0535 (!) 154/79 98.2 F (36.8 C) Oral 92 20 100 %  03/03/20 0020 110/84 98.2 F (36.8 C) Oral 75 20 94 %  03/02/20 2123 (!) 158/82 100.3 F (37.9 C) Oral 96 20 98 %  03/02/20 2016 -- -- -- -- -- 94 %    O/e in some resp distress Chest b/l rhonchi HS irregular Abd soft  Labs CBC Latest Ref Rng & Units 03/02/2020 02/29/2020 02/28/2020  WBC 4.0 - 10.5 K/uL 8.9 11.2(H) 12.6(H)  Hemoglobin 12.0 - 15.0 g/dL 10.1(L) 10.9(L) 12.9  Hematocrit 36.0 - 46.0 % 31.5(L) 33.6(L) 39.1  Platelets 150 - 400 K/uL 206 218 268    CMP Latest Ref Rng & Units 03/02/2020 03/01/2020 02/29/2020  Glucose 70 - 99 mg/dL 010(U) - 725(D)  BUN 8 - 23 mg/dL 19 - 19  Creatinine 6.64 - 1.00 mg/dL 4.03(K) 7.42(V) 9.56(L)  Sodium 135 - 145 mmol/L 133(L) - 134(L)  Potassium 3.5 - 5.1 mmol/L 4.6 - 3.1(L)  Chloride 98 - 111 mmol/L 105 - 103  CO2 22 - 32 mmol/L 22 - 20(L)  Calcium 8.9 - 10.3 mg/dL 8.0(L) - 8.3(L)  Total Protein 6.5 - 8.1 g/dL 8.7(F) - 6.1(L)  Total Bilirubin 0.3 - 1.2 mg/dL 0.9 - 1.0  Alkaline Phos 38 - 126 U/L 153(H) - 77  AST 15 - 41 U/L 71(H) - 28  ALT 0 - 44 U/L 73(H) - 23    Impression/recommendation  FUO- unclear etiology- infection VS autoimmune Vs MAS Blood culture neg CT abd/pelvis/Chest/cervical spine and lumbar spine - no evidence of infection CSF N Beta d glucan, fungal antibodies, ehrlichia, ricketsia, CMV PCR pending She did not respond to vanco and meropenem which were discontinued over the weekend Because of a history  of a insect  Bite while working in the yard  ( she did not know what it was )( but no tick found ) was started on doxy yesterday- fever curve so far coming down- will observe closely  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 Appreciate Dr.Kernodle's recommedation   Discussed the management with patient and care team

## 2020-03-03 NOTE — Consult Note (Signed)
Reason for Consult: Rheumatoid arthritis, fevers  Referring Physician: Hospitalist  Misty Briggs   HPI: 69 year old white female.  Long history of asthma and COPD as well as rheumatoid arthritis.  Previously on methotrexate.  Had Covid in the fall complicated by 2 hospitalizations and was some residual shortness of breath and interstitial lung disease.  Last steroids were in February.  And for 4 days she had intermittent fevers.  So weak she could hardly get out of bed.  Was admitted.  Mild elevation creatinine 1.3.  Liver functions with transaminitis.  Elevated ferritin at 525 CRP at 27 sed rate at 63 and mild proteinuria with urine protein at 100 without active sediment Chest CT shows some scarring and interstitial lung disease and atelectasis but no major change. She has not had a rash.  Joints have not been flared.  She is only been on Plaquenil She has not had any abdominal pain or diarrhea.  No sun photosensitivity.  No raynauds.  No headache.  No jaw stiffness.  No morning stiffness. PMH: Diabetes COPD.  Osteoarthritis.  Rheumatoid arthritis  SURGICAL HISTORY: Cervical fusion fusion  Family History: No significant family history of connective tissue disease  Social History: No cigarettes or alcohol  Allergies:  Allergies  Allergen Reactions  . Doxycycline Other (See Comments)    Thrush  . Penicillins Hives, Palpitations and Rash  . Azithromycin Other (See Comments)    Thrush  . Levofloxacin Other (See Comments)    Joint pain/muscle pain  . Sulfa Antibiotics Rash    Medications:  Scheduled: . aspirin EC  81 mg Oral Daily  . budesonide  2 mL Inhalation BID  . calcium carbonate  500 mg of elemental calcium Oral Q breakfast  . enoxaparin (LOVENOX) injection  40 mg Subcutaneous Q24H  . ferrous sulfate  325 mg Oral BID WC  . ipratropium-albuterol  3 mL Nebulization Once  . loratadine  10 mg Oral QPM  . magnesium gluconate  500 mg Oral Daily  . metoprolol tartrate  25 mg  Oral BID  . montelukast  10 mg Oral QHS  . multivitamin with minerals  1 tablet Oral Daily  . pantoprazole  40 mg Oral Daily  . potassium chloride  10 mEq Oral Daily  . pravastatin  20 mg Oral q1800  . sodium chloride flush  3 mL Intravenous Once  . vitamin B-12  1,000 mcg Oral Daily        ROS: Weight stable.  No visual changes.  No diarrhea.   PHYSICAL EXAM: Blood pressure (!) 158/74, pulse 90, temperature 98.6 F (37 C), temperature source Oral, resp. rate 20, height 5\' 5"  (1.651 m), weight 73.5 kg, SpO2 100 %. Pleasant female.  No acute distress.  Oxygen in place.  Mild dyspneic with moving around the bed.  No rash.  Sclera clear.  Oropharynx clear.  No cervical or axillary adenopathy.  No significant murmur.  Nontender abdomen.  1+ edema. Musculoskeletal: Mild decreased range of motion of cervical spine.  Shoulders move well.  Elbows without effusions.  Wrists MCPs PIPs without significant synovitis.  Hips move well.  No knee effusions.  Symmetric reflexes  Assessment: Systemic illness with fever, mild transaminitis, mild proteinuria, anemia, shortness of breath with pulmonary infiltrates since her Covid.  Concerned about connective tissue disease.  Not consistent with temporal arteritis or flare of rheumatoid.  Does not have other manifestations of lupus though does have some proteinuria.  Does not have sinus disease consistent with ANCA positive  vasculitis but does have some pulmonary disease and mild proteinuria Other concern would be post Covid syndrome such as incomplete stills disease or macrophage activation COPD Rheumatoid thrice, quiet   Recommendations: Discussed with hospitalist Dr. Allena Katz.  Serologies.  Urine protein to creatinine ratio.  If no obvious infection then would consider starting prednisone 60 mg a day and watch her laboratory and temperature response  Kandyce Rud 03/03/2020, 1:16 PM

## 2020-03-04 DIAGNOSIS — R509 Fever, unspecified: Secondary | ICD-10-CM | POA: Diagnosis not present

## 2020-03-04 DIAGNOSIS — R7401 Elevation of levels of liver transaminase levels: Secondary | ICD-10-CM

## 2020-03-04 DIAGNOSIS — Z8616 Personal history of covid-19: Secondary | ICD-10-CM | POA: Diagnosis not present

## 2020-03-04 DIAGNOSIS — M069 Rheumatoid arthritis, unspecified: Secondary | ICD-10-CM | POA: Diagnosis not present

## 2020-03-04 LAB — MISC LABCORP TEST (SEND OUT)
LabCorp test name: 138412
Labcorp test code: 138412

## 2020-03-04 LAB — LEGIONELLA PNEUMOPHILA SEROGP 1 UR AG: L. pneumophila Serogp 1 Ur Ag: NEGATIVE

## 2020-03-04 LAB — CULTURE, BLOOD (ROUTINE X 2)
Culture: NO GROWTH
Culture: NO GROWTH

## 2020-03-04 LAB — HEPATIC FUNCTION PANEL
ALT: 147 U/L — ABNORMAL HIGH (ref 0–44)
AST: 86 U/L — ABNORMAL HIGH (ref 15–41)
Albumin: 2.6 g/dL — ABNORMAL LOW (ref 3.5–5.0)
Alkaline Phosphatase: 401 U/L — ABNORMAL HIGH (ref 38–126)
Bilirubin, Direct: 0.4 mg/dL — ABNORMAL HIGH (ref 0.0–0.2)
Indirect Bilirubin: 0.8 mg/dL (ref 0.3–0.9)
Total Bilirubin: 1.2 mg/dL (ref 0.3–1.2)
Total Protein: 5.8 g/dL — ABNORMAL LOW (ref 6.5–8.1)

## 2020-03-04 LAB — PROTEIN ELECTROPHORESIS, SERUM
A/G Ratio: 1 (ref 0.7–1.7)
Albumin ELP: 2.6 g/dL — ABNORMAL LOW (ref 2.9–4.4)
Alpha-1-Globulin: 0.3 g/dL (ref 0.0–0.4)
Alpha-2-Globulin: 1 g/dL (ref 0.4–1.0)
Beta Globulin: 0.8 g/dL (ref 0.7–1.3)
Gamma Globulin: 0.7 g/dL (ref 0.4–1.8)
Globulin, Total: 2.7 g/dL (ref 2.2–3.9)
Total Protein ELP: 5.3 g/dL — ABNORMAL LOW (ref 6.0–8.5)

## 2020-03-04 LAB — HSV DNA BY PCR (REFERENCE LAB)
HSV 1 DNA: NEGATIVE
HSV 2 DNA: NEGATIVE

## 2020-03-04 LAB — MPO/PR-3 (ANCA) ANTIBODIES
ANCA Proteinase 3: <3.5 U/mL (ref 0.0–3.5)
Myeloperoxidase Abs: <9 U/mL (ref 0.0–9.0)

## 2020-03-04 LAB — PROCALCITONIN: Procalcitonin: 0.75 ng/mL

## 2020-03-04 LAB — ANA W/REFLEX: Anti Nuclear Antibody (ANA): NEGATIVE

## 2020-03-04 LAB — FUNGITELL, SERUM: Fungitell Result: <31 pg/mL (ref <80)

## 2020-03-04 MED ORDER — METOPROLOL TARTRATE 25 MG PO TABS: 25.0000 mg | ORAL_TABLET | Freq: Two times a day (BID) | ORAL | Status: DC

## 2020-03-04 MED ORDER — FLUTICASONE PROPIONATE HFA 110 MCG/ACT IN AERO
2.0000 | INHALATION_SPRAY | Freq: Two times a day (BID) | RESPIRATORY_TRACT | Status: DC
  Administered 2020-03-04 – 2020-03-05 (×4): 2 via RESPIRATORY_TRACT
  Filled 2020-03-04: qty 12

## 2020-03-04 MED ORDER — ALBUTEROL SULFATE (2.5 MG/3ML) 0.083% IN NEBU: 2.5000 mg | INHALATION_SOLUTION | RESPIRATORY_TRACT | Status: DC | PRN

## 2020-03-04 MED ORDER — PREDNISONE 20 MG PO TABS
60.0000 mg | ORAL_TABLET | Freq: Every day | ORAL | Status: DC
  Administered 2020-03-05 – 2020-03-06 (×2): 60 mg via ORAL
  Filled 2020-03-04 (×2): qty 3

## 2020-03-04 MED ORDER — BUDESONIDE 0.25 MG/2ML IN SUSP
0.5000 mg | Freq: Two times a day (BID) | RESPIRATORY_TRACT | Status: DC
  Administered 2020-03-04 – 2020-03-06 (×4): 0.5 mg via RESPIRATORY_TRACT
  Filled 2020-03-04 (×4): qty 4

## 2020-03-04 MED ORDER — NYSTATIN 100000 UNIT/ML MT SUSP
5.0000 mL | Freq: Four times a day (QID) | OROMUCOSAL | Status: DC
  Administered 2020-03-04 – 2020-03-06 (×8): 500000 [IU] via OROMUCOSAL
  Filled 2020-03-04 (×7): qty 5

## 2020-03-04 MED ORDER — TIOTROPIUM BROMIDE MONOHYDRATE 18 MCG IN CAPS
18.0000 ug | ORAL_CAPSULE | Freq: Every day | RESPIRATORY_TRACT | Status: DC
  Administered 2020-03-04 – 2020-03-05 (×2): 18 ug via RESPIRATORY_TRACT
  Filled 2020-03-04: qty 5

## 2020-03-04 MED ORDER — IPRATROPIUM-ALBUTEROL 0.5-2.5 (3) MG/3ML IN SOLN
3.0000 mL | Freq: Four times a day (QID) | RESPIRATORY_TRACT | Status: DC
  Administered 2020-03-04 – 2020-03-05 (×4): 3 mL via RESPIRATORY_TRACT
  Filled 2020-03-04 (×3): qty 3

## 2020-03-04 MED ORDER — DOXYCYCLINE HYCLATE 100 MG PO TABS
100.0000 mg | ORAL_TABLET | Freq: Two times a day (BID) | ORAL | Status: DC
  Administered 2020-03-04 – 2020-03-06 (×4): 100 mg via ORAL
  Filled 2020-03-04 (×4): qty 1

## 2020-03-04 NOTE — Progress Notes (Signed)
ID Fever down X 48 hrs Sob better Cough better Participated with [employer] better  Patient Vitals for the past 24 hrs:  BP Temp Temp src Pulse Resp SpO2  03/04/20 2033 (!) 178/85 98.4 F (36.9 C) Oral 100 (!) 24 100 %  03/04/20 1438 -- -- -- 81 20 99 %  03/04/20 1150 (!) 146/69 99.3 F (37.4 C) Oral 72 20 100 %  03/04/20 0756 -- -- -- 74 18 99 %  03/04/20 0539 (!) 161/93 98.5 F (36.9 C) Oral 91 20 99 %   O/e awake and alert Ches b/l air entry [id number] Abd soft  Labs  CBC Latest Ref Rng & Units 03/02/2020 02/29/2020 02/28/2020  WBC 4.0 - 10.5 K/uL 8.9 11.2(H) 12.6(H)  Hemoglobin 12.0 - 15.0 g/dL 10.1(L) 10.9(L) 12.9  Hematocrit 36.0 - 46.0 % 31.5(L) 33.6(L) 39.1  Platelets 150 - 400 K/uL 206 218 268    CMP Latest Ref Rng & Units 03/04/2020 03/02/2020 03/01/2020  Glucose 70 - 99 mg/dL - 353(I) -  BUN 8 - 23 mg/dL - 19 -  Creatinine 1.44 - 1.00 mg/dL - 3.15(Q) 0.08(Q)  Sodium 135 - 145 mmol/L - 133(L) -  Potassium 3.5 - 5.1 mmol/L - 4.6 -  Chloride 98 - 111 mmol/L - 105 -  CO2 22 - 32 mmol/L - 22 -  Calcium 8.9 - 10.3 mg/dL - 8.0(L) -  Total Protein 6.5 - 8.1 g/dL 7.6(P) 9.5(K) -  Total Bilirubin 0.3 - 1.2 mg/dL 1.2 0.9 -  Alkaline Phos 38 - 126 U/L 401(H) 153(H) -  AST 15 - 41 U/L 86(H) 71(H) -  ALT 0 - 44 U/L 147(H) 73(H) -   Infectious disease work up Crypto neg Fungal antibodies Beta d glucan < 31 Blood culture Csf culture N Ehrlichia PCR Rickettsial antibodies CMV PCR HIv neg  Impression/recommendation  Fever resolved -unclear etiology- infection VS inflammatory It resolved since starting doxy- could be coincidental Cultures neg, ehrlichia/rickettsia pending  transaminitis and alkaline phosphotase increase Get [place] RUQ   Rheumatoid arthritis- on hydroxychloroquine Seen by Dr.Kernodle- on hold- started prednisone 60mg  only today ( 4/13) -inflammatory , 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  Discussed the management with the  patient and Dr.patel

## 2020-03-04 NOTE — Progress Notes (Signed)
[facility] [place] NAME: Misty Briggs    MR#:  [id number]  DATE OF BIRTH:  July 11, 1951  SUBJECTIVE:  remains afebrile now. Complains of poor taste and thrush in mouth. complaining of shortness of breath. Week deconditioned  REVIEW OF SYSTEMS:   Review of Systems  Constitutional: Positive for chills, fever and malaise/fatigue. Negative for weight loss.  HENT: Negative for ear discharge, ear pain and nosebleeds.   Eyes: Negative for blurred vision, pain and discharge.  Respiratory: Negative for sputum production, shortness of breath, wheezing and stridor.   Cardiovascular: Negative for chest pain, palpitations, orthopnea and PND.  Gastrointestinal: Negative for abdominal pain, diarrhea, nausea and vomiting.  Genitourinary: Negative for frequency and urgency.  Musculoskeletal: Positive for back pain and joint pain.  Neurological: Negative for sensory change, speech change, focal weakness and weakness.  Psychiatric/Behavioral: Negative for depression and hallucinations. The patient is not nervous/anxious.    Tolerating Diet:some Tolerating PT: pending  DRUG ALLERGIES:   Allergies  Allergen Reactions  . Doxycycline Other (See Comments)    Thrush  . Penicillins Hives, Palpitations and Rash  . Azithromycin Other (See Comments)    Thrush  . Levofloxacin Other (See Comments)    Joint pain/muscle pain  . Sulfa Antibiotics Rash    VITALS:  Blood pressure (!) 146/69, pulse 81, temperature 99.3 F (37.4 C), temperature source Oral, resp. rate 20, height '5\' 5"'  (1.651 m), weight 73.5 kg, SpO2 99 %.  PHYSICAL EXAMINATION:   Physical Exam  GENERAL:  69 y.o.-year-old patient lying in the bed with no acute distress. weak EYES: Pupils equal, round, reactive to light and accommodation. No scleral icterus.   HEENT: Head atraumatic, normocephalic. Oropharynx and nasopharynx clear. thrush NECK:  Supple, no jugular venous distention. No thyroid  enlargement, no tenderness.  LUNGS: coarse breath sounds bilaterally, no wheezing, rales, rhonchi. No use of accessory muscles of respiration.  CARDIOVASCULAR: S1, S2 normal. No murmurs, rubs, or gallops.  ABDOMEN: Soft, nontender, nondistended. Bowel sounds present. No organomegaly or mass.  EXTREMITIES: No cyanosis, clubbing or edema b/l.    NEUROLOGIC: Cranial nerves II through XII are intact. No focal Motor or sensory deficits b/l.   PSYCHIATRIC:  patient is alert and oriented x 3.  SKIN: No obvious rash, lesion, or ulcer.   LABORATORY PANEL:  CBC Recent Labs  Lab 03/02/20 1033  WBC 8.9  HGB 10.1*  HCT 31.5*  PLT 206    Chemistries  Recent Labs  Lab 03/02/20 0607  NA 133*  K 4.6  CL 105  CO2 22  GLUCOSE 136*  BUN 19  CREATININE 1.17*  CALCIUM 8.0*  AST 71*  ALT 73*  ALKPHOS 153*  BILITOT 0.9   Cardiac Enzymes No results for input(s): TROPONINI in the last 168 hours. RADIOLOGY:  No results found. ASSESSMENT AND PLAN:  Misty Briggs is a 69 y.o. female with medical history significant for asthma, COPD, hypertension, GERD, hyperlipidemia and Covid infection about 5 months ago who presents with concerns of fever and generalized fatigue. Her symptoms started acutely last night when she developed fever up to 103 and has noted generalized weakness.  She has a mild cough but denies any runny nose.  Sepsis with unknown source/Febrile illness (infectitous vs autoimmune) Acute Kidney injury/ATN  -came in with fever 103, tachycardia, tachypnea and elevated white count. -Pro calcitonin 0.39--2.4--1.1--0.75 - pt was on IV Vanc and meropenem- d/ced--> now on empiric doxycycline for ?tick bite -CSF culture  negative -Blood culture negative so far -CXR negative -UA not impressive for UTI -ESR 53, CRP 27.7 -Crypto neg -Fungal antibody pending -Ferritin 525 -cryptococcal Ag neg -patient has CT scan of the chest, abdomen, pelvis, cervical and lumbar spine--- no source of  infection identified -Appreciate rheumatology consultation - Could bePost covid syndrome /? macrophage activation syndrome.  -Recommends prednisone 60 mg daily --will start it for now and pt will need to f/u Dr Jefm Bryant as out pt, Hold off plaquenil  Acute kidney injury  Monitor after fluids Avoid nephrotoxic agents  -came in with creat 1.18--1.34--1.17  Rheumatoid arthritis Hold hydroxychloroquine for now at discharge--pt can f/u Dr Jefm Bryant as outpt  COPD/asthma with bilateralground glass opacities continue bronchodilators Assess for home oxygen use desats to 80's w/o oxygen Pt will need to follow-up with pulmonary Dr. Raul Del as outpatient  GERD continue PPI  Hyperlipidemia Continue statin  PT to see pt today TOC  For d/c plans  DVT prophylaxis: SCDs Code Status: Full Family Communication: Plan discussed with husband in the room disposition Plan: Home tomorrow with home health and most likely oxygen -PT pending -assess for home oxygen Consults called: ID, Rheumatology  TOTAL TIME TAKING CARE OF THIS PATIENT: *30* minutes.  >50% time spent on counselling and coordination of care  Note: This dictation was prepared with Dragon dictation along with smaller phrase technology. Any transcriptional errors that result from this process are unintentional.  [name] M.D    [facility]   CC: Primary care physician; Misty Briggs, MDPatient ID: Misty Briggs, female   DOB: [birth year], 69 y.o.   MRN: [id number]

## 2020-03-04 NOTE — Consult Note (Signed)
Rheumatology follow-up Temperatures down the still feels weak.  Not a lot of appetite.  Has been out of bed.  Some cough and shortness of breath.  On oxygen No joint pain.  No abdominal pain. Elevated protein to creatinine ratio.  CBC stable.  Pending serologies.  Exam: Distant lung sounds.  Faint crackles.  No rash.  Nontender abdomen.  No synovitis  Impressions: Inflammatory illness.  No obvious infection.  Features of interstitial lung disease, proteinuria (I discussed with Dr. Thedore Mins of nephrology.  This could be related to her contrast CT studies) mild transaminitis, fevers, improving.  No obvious flare of her synovitis Pending ANA and ANCA antibodies.  Cannot rule out a post Covid inflammatory syndrome or macrophage activation though blood counts have held up well  Recommendations: Consider starting prednisone 60 mg a day if no evidence of active infectious disease unless ID thinks that she has  improved enough to hold off .

## 2020-03-05 ENCOUNTER — Inpatient Hospital Stay: Payer: Medicare Other

## 2020-03-05 DIAGNOSIS — R7989 Other specified abnormal findings of blood chemistry: Secondary | ICD-10-CM | POA: Diagnosis not present

## 2020-03-05 DIAGNOSIS — R519 Headache, unspecified: Secondary | ICD-10-CM | POA: Diagnosis not present

## 2020-03-05 DIAGNOSIS — R748 Abnormal levels of other serum enzymes: Secondary | ICD-10-CM

## 2020-03-05 DIAGNOSIS — M069 Rheumatoid arthritis, unspecified: Secondary | ICD-10-CM | POA: Diagnosis not present

## 2020-03-05 DIAGNOSIS — R509 Fever, unspecified: Secondary | ICD-10-CM | POA: Diagnosis not present

## 2020-03-05 LAB — COMPREHENSIVE METABOLIC PANEL
ALT: 127 U/L — ABNORMAL HIGH (ref 0–44)
AST: 51 U/L — ABNORMAL HIGH (ref 15–41)
Albumin: 2.6 g/dL — ABNORMAL LOW (ref 3.5–5.0)
Alkaline Phosphatase: 433 U/L — ABNORMAL HIGH (ref 38–126)
Anion gap: 10 (ref 5–15)
BUN: 14 mg/dL (ref 8–23)
CO2: 24 mmol/L (ref 22–32)
Calcium: 8.5 mg/dL — ABNORMAL LOW (ref 8.9–10.3)
Chloride: 105 mmol/L (ref 98–111)
Creatinine, Ser: 0.79 mg/dL (ref 0.44–1.00)
GFR calc Af Amer: >60 mL/min (ref >60)
GFR calc non Af Amer: >60 mL/min (ref >60)
Glucose, Bld: 159 mg/dL — ABNORMAL HIGH (ref 70–99)
Potassium: 4.1 mmol/L (ref 3.5–5.1)
Sodium: 139 mmol/L (ref 135–145)
Total Bilirubin: 1.4 mg/dL — ABNORMAL HIGH (ref 0.3–1.2)
Total Protein: 6 g/dL — ABNORMAL LOW (ref 6.5–8.1)

## 2020-03-05 LAB — MISC LABCORP TEST (SEND OUT): Labcorp test code: 164284

## 2020-03-05 LAB — RICKETTSIAL FEVER ABS
Q Fever Phase I: NEGATIVE
Q Fever Phase II: NEGATIVE
RMSF IgG: NEGATIVE

## 2020-03-05 LAB — HEPATITIS PANEL, ACUTE
HCV Ab: NONREACTIVE
Hep A IgM: NONREACTIVE
Hep B C IgM: NONREACTIVE
Hepatitis B Surface Ag: NONREACTIVE

## 2020-03-05 LAB — CMV DNA, QUANTITATIVE, PCR
CMV DNA Quant: NEGATIVE IU/mL
Log10 CMV Qn DNA Pl: UNDETERMINED log10 IU/mL

## 2020-03-05 MED ORDER — IPRATROPIUM-ALBUTEROL 0.5-2.5 (3) MG/3ML IN SOLN
3.0000 mL | Freq: Three times a day (TID) | RESPIRATORY_TRACT | Status: DC
  Administered 2020-03-05 – 2020-03-06 (×2): 3 mL via RESPIRATORY_TRACT
  Filled 2020-03-05 (×2): qty 3

## 2020-03-05 MED ORDER — IRBESARTAN 150 MG PO TABS
150.0000 mg | ORAL_TABLET | Freq: Every day | ORAL | Status: DC
  Administered 2020-03-05 – 2020-03-06 (×2): 150 mg via ORAL
  Filled 2020-03-05 (×2): qty 1

## 2020-03-05 MED ORDER — METOPROLOL TARTRATE 25 MG PO TABS: 25.0000 mg | ORAL_TABLET | Freq: Two times a day (BID) | ORAL | Status: DC

## 2020-03-05 MED ORDER — FLUCONAZOLE 100 MG PO TABS
100.0000 mg | ORAL_TABLET | Freq: Every day | ORAL | Status: DC
  Filled 2020-03-05: qty 1

## 2020-03-05 NOTE — Evaluation (Addendum)
Physical Therapy Evaluation Patient Details Name: Misty Briggs MRN: [id number] DOB: 14-Mar-1951 Today's Date: 03/05/2020   History of Present Illness  Misty Briggs is a 41yoF who [facility] c fever and malaise. RR called on 4/10 for visual changes and hypotension. PMH: asthma, RA, COPD, hypertension, GERD, hyperlipidemia and Covid infection about 5 months ago, remote ACDF, RCR, MI.  Clinical Impression  Pt admitted with above diagnosis. Pt currently with functional limitations due to the deficits listed below (see "PT Problem List"). Upon entry, pt in bed, awake and agreeable to participate. The pt is alert and oriented x4 pleasant, conversational, and generally a good historian. Supervision for transfers and AMB with a RW. Pt has good awareness of safety and of deficits. Pt is most affected by acute balance deficits and continued DOE not correlated with hypoxia. Functional mobility assessment demonstrates increased effort/time requirements, poor tolerance, and need for physical assistance, whereas the patient performed these at a higher level of independence PTA. Pt encouraged to AMB daily with nursing, so that PT sessions can be more focal to balance training. Pt will benefit from skilled PT intervention to increase independence and safety with basic mobility in preparation for discharge to the venue listed below.       Follow Up Recommendations Home health PT    Equipment Recommendations  None recommended by PT    Recommendations for Other Services       Precautions / Restrictions Precautions Precautions: Fall Restrictions Weight Bearing Restrictions: No      Mobility  Bed Mobility               General bed mobility comments: Seated EOB at entry  Transfers Overall transfer level: Needs assistance Equipment used: Rolling walker (2 wheeled) Transfers: Sit to/from Stand Sit to Stand: Min guard         General transfer comment: max effort, heavy UE support;  slow  Ambulation/Gait Ambulation/Gait assistance: Supervision Gait Distance (Feet): 400 Feet Assistive device: Rolling walker (2 wheeled) Gait Pattern/deviations: Step-to pattern Gait velocity: 0.3m/s   General Gait Details: slow, stops intermittently for recovery of DOE, SpO2 95%, HR 100s. Not safe to AMB without RW at this time.  Stairs            Wheelchair Mobility    Modified Rankin (Stroke Patients Only)       Balance Overall balance assessment: Needs assistance;Modified Independent             Standing balance comment: heightened falls anxiety with normal stance, narrow stance, and eyes closed normal stance, sway WNL, no gross LOB. Pt aware of deficits, mostly subjetcive.                             Pertinent Vitals/Pain Pain Assessment: Faces(back pain in AMB) Faces Pain Scale: Hurts little more Pain Intervention(s): Limited activity within patient's tolerance;Monitored during session;Repositioned    Home Living Family/patient expects to be discharged to:: Private residence Living Arrangements: Spouse/significant other;Children(Son works FT daytime, Husband is retired) Available Help at Discharge: Family Type of Home: House Home Access: Level entry     Home Layout: Two level;Able to live on main level with bedroom/bathroom Home Equipment: Kasandra Knudsen - single point;Walker - 2 wheels;Shower seat Additional Comments: Pt reports using 2L/min O2 at night only.    Prior Function           Comments: At baseline, pt is a limited community AMB, some  SOB/DOE issues from COPD/asthma (COVID), no balance issues until admission.     Hand Dominance   Dominant Hand: Right    Extremity/Trunk Assessment   Upper Extremity Assessment Upper Extremity Assessment: Generalized weakness;Overall [facility] for tasks assessed    Lower Extremity Assessment Lower Extremity Assessment: Overall WFL for tasks assessed;Generalized weakness    Cervical / Trunk  Assessment Cervical / Trunk Assessment: Normal  Communication      Cognition Arousal/Alertness: Awake/alert Behavior During Therapy: WFL for tasks assessed/performed Overall Cognitive Status: Within Functional Limits for tasks assessed                                        General Comments      Exercises     Assessment/Plan    PT Assessment Patient needs continued PT services  PT Problem List Decreased strength;Decreased range of motion;Decreased activity tolerance;Decreased balance;Decreased mobility       PT Treatment Interventions DME instruction;Gait training;Balance training;Stair training;Functional mobility training;Therapeutic activities;Therapeutic exercise;Patient/family education    PT Goals (Current goals can be found in the Care Plan section)  Acute Rehab PT Goals Patient Stated Goal: return to home, regain [place] for community distance AMB PT Goal Formulation: With patient Time For Goal Achievement: 03/19/20 Potential to Achieve Goals: Fair    Frequency Min 2X/week   Barriers to discharge        Co-evaluation               AM-PAC PT "6 Clicks" Mobility  Outcome Measure Help needed turning from your back to your side while in a flat bed without using bedrails?: A Little Help needed moving from lying on your back to sitting on the side of a flat bed without using bedrails?: A Little Help needed moving to and from a bed to a chair (including a wheelchair)?: A Little Help needed standing up from a chair using your arms (e.g., wheelchair or bedside chair)?: A Little Help needed to walk in hospital room?: A Little Help needed climbing 3-5 steps with a railing? : A Little 6 Click Score: 18    End of Session Equipment Utilized During Treatment: Gait belt Activity Tolerance: Patient tolerated treatment well;Patient limited by fatigue;Patient limited by pain Patient left: in chair;with call bell/phone within reach;with chair alarm  set Nurse Communication: Mobility status PT Visit Diagnosis: Unsteadiness on feet (R26.81);Other abnormalities of gait and mobility (R26.89);Difficulty in walking, not elsewhere classified (R26.2)    Time: 9211-9417 PT Time Calculation (min) (ACUTE ONLY): 30 min   Charges:   PT Evaluation $PT Eval Moderate Complexity: 1 Mod PT Treatments $Therapeutic Exercise: 8-22 mins        11:40 AM, 03/05/20 Rosamaria Lints, PT, DPT Physical Therapist - [facility]  [phone] (ASCOM)    Higinio Grow C 03/05/2020, 11:37 AM

## 2020-03-05 NOTE — Progress Notes (Signed)
ID antibioitcs Vanco/meropenem 4/8 >>4/11  Doxycycline 4/11>>   Pt doing much better No fever in > 48 hrs    O/e awake and alert, no distress Chest b/l air entry abd soft- no rt upper quadrant tenderness minimal thrush mouth CN non focal   ID work up Blood culture Neg CSF culture neg CMV PCR serum negative Ehrlichia PCR Neg Beta D glucan < 31 Cryptococcus neg HIV NR Rickettsia antibodies -pending Urine legionella - neg Fungal antibodies ESR 63 CRP 27 Ferritin 525  lfts      Impression/Recommendation  Fever with headache /  increasing LFTS -- infection VS inflammatory  Did not respond to vanco and meropenem- they were stopped and she was started on Doxy on 03/02/20 because of concern for tick borne illness as she had a h/o insect bite ( but no tick attached or seen) and with increasing LFTS  - Platelet and wbc were normal. 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 Also Rhematoid  factor 18-Rrheumatologist seen the patient. Recommended steroids  Rheumatoid arthritis- methotrexate was stopped in feb because of COVID and resp pathology Wondered whether her fever was due to flare up of rheumatoid arthritis. But before steroids were started ( 4/14( the fever had resolved on 4/12  Worsening transaminitis and increasing alkaline phosphotase - will DC  Tylenol. Primary team to consider stopping stain until LFTS normalize Will get hepatitis panel and EBV PCR Pt getting ultrasound to look at the rt upper quadrant Will continue Doxy for another 2 days  Discussed the management with patient

## 2020-03-05 NOTE — Progress Notes (Signed)
SATURATION QUALIFICATIONS: (This note is used to comply with regulatory documentation for home oxygen)  Patient Saturations on Room Air at Rest = *96%  Patient Saturations on Room Air while Ambulating = 95%  Patient Saturations on 2 Liters of oxygen while Ambulating = 100%  Please briefly explain why patient needs home oxygen: Pt on Home O2 at night for previous COVID symptoms. At this time, no present need for O2 while awake and ambulating.

## 2020-03-05 NOTE — Progress Notes (Signed)
[facility] [place] NAME: Misty Briggs    MR#:  [id number]  DATE OF BIRTH:  July 07, 1951  SUBJECTIVE:  No overnight issues, complaining of dry cough, shortness of breath has been improved.  Patient remained afebrile.   REVIEW OF SYSTEMS:   Review of Systems  Constitutional: Positive for malaise/fatigue. Negative for weight loss.  HENT: Negative for ear discharge, ear pain and nosebleeds.   Eyes: Negative for blurred vision, pain and discharge.  Respiratory: Negative for sputum production, shortness of breath, wheezing and stridor.   Cardiovascular: Negative for chest pain, palpitations, orthopnea and PND.  Gastrointestinal: Negative for abdominal pain, diarrhea, nausea and vomiting.  Genitourinary: Negative for frequency and urgency.  Musculoskeletal: Positive for back pain and joint pain.  Neurological: Negative for sensory change, speech change, focal weakness and weakness.  Psychiatric/Behavioral: Negative for depression and hallucinations. The patient is not nervous/anxious.    Tolerating Diet:some Tolerating PT: pending  DRUG ALLERGIES:   Allergies  Allergen Reactions  . Doxycycline Other (See Comments)    Thrush  . Penicillins Hives, Palpitations and Rash  . Azithromycin Other (See Comments)    Thrush  . Levofloxacin Other (See Comments)    Joint pain/muscle pain  . Sulfa Antibiotics Rash    VITALS:  Blood pressure (!) 156/70, pulse 83, temperature 98.6 F (37 C), temperature source Oral, resp. rate 20, height '5\' 5"'  (1.651 m), weight 73.5 kg, SpO2 95 %.  PHYSICAL EXAMINATION:   Physical Exam  GENERAL:  69 y.o.-year-old patient lying in the bed with no acute distress. weak EYES: Pupils equal, round, reactive to light and accommodation. No scleral icterus.   HEENT: Head atraumatic, normocephalic. Oropharynx and nasopharynx clear. thrush NECK:  Supple, no jugular venous distention. No thyroid enlargement, no tenderness.   LUNGS: coarse breath sounds bilaterally, no wheezing, rales, rhonchi. No use of accessory muscles of respiration.  CARDIOVASCULAR: S1, S2 normal. No murmurs, rubs, or gallops.  ABDOMEN: Soft, nontender, nondistended. Bowel sounds present. No organomegaly or mass.  EXTREMITIES: No cyanosis, clubbing or edema b/l.    NEUROLOGIC: Cranial nerves II through XII are intact. No focal Motor or sensory deficits b/l.   PSYCHIATRIC:  patient is alert and oriented x 3.  SKIN: No obvious rash, lesion, or ulcer.   LABORATORY PANEL:  CBC Recent Labs  Lab 03/02/20 1033  WBC 8.9  HGB 10.1*  HCT 31.5*  PLT 206    Chemistries  Recent Labs  Lab 03/02/20 0607 03/02/20 0607 03/04/20 1559  NA 133*  --   --   K 4.6  --   --   CL 105  --   --   CO2 22  --   --   GLUCOSE 136*  --   --   BUN 19  --   --   CREATININE 1.17*  --   --   CALCIUM 8.0*  --   --   AST 71*   < > 86*  ALT 73*   < > 147*  ALKPHOS 153*   < > 401*  BILITOT 0.9   < > 1.2   < > = values in this interval not displayed.   Cardiac Enzymes No results for input(s): TROPONINI in the last 168 hours. RADIOLOGY:  No results found. ASSESSMENT AND PLAN:  Misty Briggs is a 69 y.o. female with medical history significant for asthma, COPD, hypertension, GERD, hyperlipidemia and Covid infection about 5 months ago who presents with  concerns of fever and generalized fatigue. Her symptoms started acutely last night when she developed fever up to 103 and has noted generalized weakness.  She has a mild cough but denies any runny nose.  Sepsis with unknown source/Febrile illness (infectitous vs autoimmune) Acute Kidney injury/ATN  -came in with fever 103, tachycardia, tachypnea and elevated white count. -Pro calcitonin 0.39--2.4--1.1--0.75 - pt was on IV Vanc and meropenem- d/ced--> now on empiric doxycycline for ?tick bite -CSF culture negative -Blood culture negative so far -CXR negative -UA not impressive for UTI -ESR 53, CRP  27.7 -Crypto neg -Fungal antibody pending -Ferritin 525 -cryptococcal Ag neg -patient has CT scan of the chest, abdomen, pelvis, cervical and lumbar spine--- no source of infection identified -Appreciate rheumatology consultation - Could bePost covid syndrome /? macrophage activation syndrome.  -Recommends prednisone 60 mg daily --start on 4/14, f/u Dr Jefm Bryant as out pt, Hold off plaquenil   Transaminitis, elevated AST/ALT Follow LFTs and ultrasound abdomen RUQ   Acute kidney injury  Monitor after fluids Avoid nephrotoxic agents  -came in with creat 1.18--1.34--1.17  Rheumatoid arthritis Hold hydroxychloroquine for now at discharge--pt can f/u Dr Jefm Bryant as outpt  COPD/asthma with bilateralground glass opacities continue bronchodilators Assess for home oxygen use desats to 80's w/o oxygen Pt will need to follow-up with pulmonary Dr. Raul Del as outpatient  GERD continue PPI  Hyperlipidemia Continue statin   Hypertension Continue metoprolol 25 g p.o. twice daily, resumed Telmisartan 40 mg switch to irbesartan 150 mg pod, started on 03/05/2020 Monitor BP and titrate medications accordingly.   PT eval done, recommended home health PT TOC  For d/c plans  DVT prophylaxis: SCDs Code Status: Full Family Communication: None available today, plan was discussed with husband by my colleague  disposition Plan: Home tomorrow with home health, ultrasound abdomen pending, LFTs still elevated,  most likely oxygen, -assess for home oxygen  Consults called: ID, Rheumatology  TOTAL TIME TAKING CARE OF THIS PATIENT: *30* minutes.  >50% time spent on counselling and coordination of care  Note: This dictation was prepared with Dragon dictation along with smaller phrase technology. Any transcriptional errors that result from this process are unintentional.  Val Riles M.D    [facility]   CC: Primary care physician; [place] Pink, MDPatient ID: Misty Briggs,  female   DOB: October 26, 1951, 69 y.o.   MRN: [id number]

## 2020-03-06 LAB — CBC
HCT: 28.6 % — ABNORMAL LOW (ref 36.0–46.0)
Hemoglobin: 9.2 g/dL — ABNORMAL LOW (ref 12.0–15.0)
MCH: 29.4 pg (ref 26.0–34.0)
MCHC: 32.2 g/dL (ref 30.0–36.0)
MCV: 91.4 fL (ref 80.0–100.0)
Platelets: 409 10*3/uL — ABNORMAL HIGH (ref 150–400)
RBC: 3.13 MIL/uL — ABNORMAL LOW (ref 3.87–5.11)
RDW: 13.9 % (ref 11.5–15.5)
WBC: 6.8 10*3/uL (ref 4.0–10.5)
nRBC: 0 % (ref 0.0–0.2)

## 2020-03-06 LAB — BASIC METABOLIC PANEL
Anion gap: 9 (ref 5–15)
BUN: 17 mg/dL (ref 8–23)
CO2: 26 mmol/L (ref 22–32)
Calcium: 8.7 mg/dL — ABNORMAL LOW (ref 8.9–10.3)
Chloride: 105 mmol/L (ref 98–111)
Creatinine, Ser: 0.77 mg/dL (ref 0.44–1.00)
GFR calc Af Amer: >60 mL/min (ref >60)
GFR calc non Af Amer: >60 mL/min (ref >60)
Glucose, Bld: 162 mg/dL — ABNORMAL HIGH (ref 70–99)
Potassium: 4.1 mmol/L (ref 3.5–5.1)
Sodium: 140 mmol/L (ref 135–145)

## 2020-03-06 LAB — HEPATIC FUNCTION PANEL
ALT: 101 U/L — ABNORMAL HIGH (ref 0–44)
AST: 36 U/L (ref 15–41)
Albumin: 2.4 g/dL — ABNORMAL LOW (ref 3.5–5.0)
Alkaline Phosphatase: 357 U/L — ABNORMAL HIGH (ref 38–126)
Bilirubin, Direct: 0.1 mg/dL (ref 0.0–0.2)
Indirect Bilirubin: 0.7 mg/dL (ref 0.3–0.9)
Total Bilirubin: 0.8 mg/dL (ref 0.3–1.2)
Total Protein: 5.7 g/dL — ABNORMAL LOW (ref 6.5–8.1)

## 2020-03-06 LAB — MAGNESIUM: Magnesium: 2.2 mg/dL (ref 1.7–2.4)

## 2020-03-06 LAB — PHOSPHORUS: Phosphorus: 3 mg/dL (ref 2.5–4.6)

## 2020-03-06 MED ORDER — AMLODIPINE BESYLATE 5 MG PO TABS: 5.0000 mg | ORAL_TABLET | Freq: Every evening | ORAL | Status: DC

## 2020-03-06 MED ORDER — BUDESONIDE 0.5 MG/2ML IN SUSP: 0.5000 mg | Freq: Two times a day (BID) | RESPIRATORY_TRACT | Status: DC

## 2020-03-06 MED ORDER — METOPROLOL TARTRATE 25 MG PO TABS: 25.0000 mg | ORAL_TABLET | Freq: Two times a day (BID) | ORAL | 0 refills | Status: DC

## 2020-03-06 MED ORDER — PREDNISONE 20 MG PO TABS: 60.0000 mg | ORAL_TABLET | Freq: Every day | ORAL | 0 refills | Status: AC

## 2020-03-06 MED ORDER — AMLODIPINE BESYLATE 5 MG PO TABS: 5.0000 mg | ORAL_TABLET | Freq: Every evening | ORAL | 0 refills | Status: DC

## 2020-03-06 MED ORDER — GUAIFENESIN-DM 100-10 MG/5ML PO SYRP: 5.0000 mL | ORAL_SOLUTION | Freq: Four times a day (QID) | ORAL | 0 refills | Status: DC | PRN

## 2020-03-06 MED ORDER — AMLODIPINE BESYLATE 5 MG PO TABS
5.0000 mg | ORAL_TABLET | Freq: Every day | ORAL | Status: DC
  Administered 2020-03-06: 08:00:00 5 mg via ORAL
  Filled 2020-03-06: qty 1

## 2020-03-06 MED ORDER — GUAIFENESIN-DM 100-10 MG/5ML PO SYRP: 5.0000 mL | ORAL_SOLUTION | Freq: Four times a day (QID) | ORAL | Status: DC | PRN

## 2020-03-06 MED ORDER — BENZONATATE 100 MG PO CAPS: 100.0000 mg | ORAL_CAPSULE | Freq: Three times a day (TID) | ORAL | 0 refills | Status: DC | PRN

## 2020-03-06 MED ORDER — MENTHOL 3 MG MT LOZG
1.0000 | LOZENGE | OROMUCOSAL | Status: DC | PRN
  Filled 2020-03-06: qty 9

## 2020-03-06 MED ORDER — LORAZEPAM 1 MG PO TABS
1.0000 mg | ORAL_TABLET | Freq: Once | ORAL | Status: AC
  Administered 2020-03-06: 1 mg via ORAL
  Filled 2020-03-06: qty 1

## 2020-03-06 MED ORDER — MAGNESIUM OXIDE 400 (241.3 MG) MG PO TABS
400.0000 mg | ORAL_TABLET | Freq: Every day | ORAL | Status: DC
  Administered 2020-03-06: 14:00:00 400 mg via ORAL
  Filled 2020-03-06: qty 1

## 2020-03-06 MED ORDER — DOXYCYCLINE HYCLATE 100 MG PO TABS: 100.0000 mg | ORAL_TABLET | Freq: Two times a day (BID) | ORAL | 0 refills | Status: AC

## 2020-03-06 NOTE — Progress Notes (Signed)
ID Afebrile X 4 days All ID work up including ricketsia, ehrlichia negative- so she does not need any furtherr doxy- today is day 5 I told her not to fill prescription for Doxy before she was discharged

## 2020-03-06 NOTE — Progress Notes (Signed)
Physical Therapy Treatment Patient Details Name: Misty Briggs MRN: [id number] DOB: June 19, 1951 Today's Date: 03/06/2020    History of Present Illness Misty Briggs is a 68yoF who comes [facility] on 02/28/20 Briggs fever and malaise. RR called on 4/10 for visual changes and hypotension. PMH: asthma, RA, COPD, hypertension, GERD, hyperlipidemia and Covid infection about 5 months ago, remote ACDF, RCR, MI.    PT Comments    Pt in bed upon entry agreeable to participate. Repeated 427ft AMB again for 2nd day, pt able to converse, has less DOE, no need to stop for dyspnea recovery, and gait speed increased by 59% this date compared to prior. Focused on static balance interventions for latter portion of session. SpO2 remains 94% or higher throughout session, some intermittent dry cough. No gross LOB with balance interventions, but pt requires minGuard assist for safety.       Follow Up Recommendations  Home health PT     Equipment Recommendations  Rolling walker with 5" wheels    Recommendations for Other Services       Precautions / Restrictions Precautions Precautions: Fall Restrictions Weight Bearing Restrictions: No    Mobility  Bed Mobility Overal bed mobility: Modified Independent Bed Mobility: Supine to Sit              Transfers Overall transfer level: Needs assistance Equipment used: Rolling walker (2 wheeled) Transfers: Sit to/from Stand Sit to Stand: Modified independent (Device/Increase time)         General transfer comment: moderate use of BUE to rise  Ambulation/Gait Ambulation/Gait assistance: Supervision Gait Distance (Feet): 400 Feet Assistive device: Rolling walker (2 wheeled) Gait Pattern/deviations: Step-to pattern Gait velocity: 0.64m/s (0.80m/s 1DA)   General Gait Details: Able to perform without stopping to recover DOE, is able to talk throughout.   Stairs             Wheelchair Mobility    Modified Rankin (Stroke Patients Only)        Balance Overall balance assessment: Modified Independent;Mild deficits observed, not formally tested Sitting-balance support: No upper extremity supported;Feet supported Sitting balance-Leahy Scale: Normal     Standing balance support: No upper extremity supported Standing balance-Leahy Scale: Good                              Cognition Arousal/Alertness: Awake/alert Behavior During Therapy: WFL for tasks assessed/performed Overall Cognitive Status: Within Functional Limits for tasks assessed                                        Exercises Other Exercises Other Exercises: -EC normal stance 3x30sec, no UE support, min guard assist Other Exercises: -Marching in place 1x20, no UE support, min guard assist Other Exercises: -narrow stance, horizontal head turns 1x10 bilat, alternating directions. supervision level assistance    General Comments        Pertinent Vitals/Pain Pain Assessment: No/denies pain Pain Intervention(s): Limited activity within patient's tolerance;Monitored during session    Home Living                      Prior Function            PT Goals (current goals can now be found in the care plan section) Acute Rehab PT Goals Patient Stated Goal: return to home, regain [place] for [place] distance  AMB PT Goal Formulation: With patient Time For Goal Achievement: 03/19/20 Potential to Achieve Goals: Fair Progress towards PT goals: Progressing toward goals    Frequency    Min 2X/week      PT Plan Current plan remains appropriate;Equipment recommendations need to be updated    Co-evaluation              AM-PAC PT "6 Clicks" Mobility   Outcome Measure  Help needed turning from your back to your side while in a flat bed without using bedrails?: None Help needed moving from lying on your back to sitting on the side of a flat bed without using bedrails?: None Help needed moving to and from a bed to a chair  (including a wheelchair)?: None Help needed standing up from a chair using your arms (e.g., wheelchair or bedside chair)?: None Help needed to walk in hospital room?: A Little Help needed climbing 3-5 steps with a railing? : A Little 6 Click Score: 22    End of Session Equipment Utilized During Treatment: Gait belt Activity Tolerance: Patient tolerated treatment well;No increased pain Patient left: in chair;with call bell/phone within reach Nurse Communication: Mobility status PT Visit Diagnosis: Unsteadiness on feet (R26.81);Other abnormalities of gait and mobility (R26.89);Difficulty in walking, not elsewhere classified (R26.2)     Time: 2094-7096 PT Time Calculation (min) (ACUTE ONLY): 30 min  Charges:  $Therapeutic Exercise: 8-22 mins $Neuromuscular Re-education: 8-22 mins                     12:01 PM, 03/06/20 Misty Briggs, PT, DPT Physical Therapist - [facility]  [phone] ([place])    Misty Briggs 03/06/2020, 11:55 AM

## 2020-03-06 NOTE — Care Management Important Message (Signed)
Important Message  Patient Details  Name: Misty Briggs MRN: [id number] Date of Birth: 06-06-1951   Medicare Important Message Given:  Yes     Johnell Comings 03/06/2020, 2:12 PM

## 2020-03-06 NOTE — Discharge Summary (Signed)
[facility] Discharge Summary   Patient: Misty Briggs NTZ:[id number]  PCP: Maryland Pink, MD  Date of admission: 02/28/2020   Date of discharge:  03/06/2020     Discharge Diagnoses:   Principal Problem:   Fever of unknown origin Active Problems:   Asthma without status asthmaticus   Gastro-esophageal reflux disease without esophagitis   Rheumatoid arthritis involving multiple joints ([place])   Sepsis ([place])   AKI (acute kidney injury) ([place])   Hypotension   COPD (chronic obstructive pulmonary disease) ([place])   HLD (hyperlipidemia)   Admitted From: Home  Disposition:  Home with [facility]  Recommendations for Outpatient Follow-up:  1. PCP: In 1 week 2. With ID and rheumatologist in 1 to 2 weeks 3.   Follow-up Information    Erby Pian, MD. Schedule an appointment as soon as possible for a visit in 1 week(s).   Specialty: Specialist Why: copd asthma Contact information: [place] [address] [phone]        Emmaline Kluver., MD. Schedule an appointment as soon as possible for a visit in 1 week(s).   Specialty: Rheumatology Why: RA Contact information: [place] [address] [phone]        Maryland Pink, MD. Schedule an appointment as soon as possible for a visit in 1 week(s).   Specialty: Family Medicine Contact information: [address] [place] [address] [phone]          Diet recommendation: Carb modified diet  Activity: The patient is advised to gradually reintroduce usual activities, as tolerated  Discharge Condition: stable  Code Status: Full code   History of present illness: As per the H and P dictated on admission. Hospital Course:  Latresa Gasser Jonesis a 69 y.o.femalewith medical history significant forasthma, COPD, hypertension, GERD, hyperlipidemia and Covid infection about 5 months ago who presents with concerns of fever and  generalized fatigue. Her symptoms started acutely last night when she developed fever up to 103 and has noted generalized weakness. She has a mild cough but denies any runny nose.  Sepsis with unknown source/Febrile illness (infectitous vs autoimmune) Acute Kidney injury/ATN  -came in with fever 103, tachycardia, tachypnea and elevated white count. -Pro calcitonin 0.39--2.4--1.1--0.75. - pt was on IV Vanc and meropenem- d/ced--> now on empiric doxycycline for ?tick bite, prescribed for 2 additional days on discharge. CSF culture negative. -Blood culture negative so far. CXR negative. UA not impressive for UTI -ESR 53, CRP 27.7, Crypto neg, Fungal antibody pending. -Ferritin 525 -cryptococcal Ag neg -patient has CT scan of the chest, abdomen, pelvis, cervical and lumbar spine--- no source of infection identified -Appreciate rheumatology consultation - Could bePost covid syndrome /? macrophage activation syndrome. -Recommends prednisone 60 mg daily --start on 4/14, f/u Dr Jefm Bryant as out pt, Hold off plaquenil.  Prescribed prednisone 60 mg p.o. daily for 5 additional days.  Recommend to follow with rheumatologist as an outpatient.   Transaminitis, elevated AST/ALT, LFTs improving, and ultrasound abdomen RUQ rule out any acute pathology  Acute kidney injury , resolved after IV hydration   Rheumatoid arthritis Hold hydroxychloroquine for now at discharge--pt can f/u Dr Jefm Bryant as outpt   COPD/asthma with bilateralground glass opacities continue bronchodilators, wheezing improved, patient is saturating well on room air. Pt will need to follow-up with pulmonary Dr. Raul Del as outpatient  GERD, continue PPI  Hyperlipidemia, Continue statin  Hypertension, Continue metoprolol 25 g p.o. twice daily, resumed Telmisartan  40 mg. Monitor BP at home and follow with PCP to titrate medications accordingly.   Body mass index is 26.96 kg/m.  Nutrition Interventions:   Patient was seen by  physical therapy, who recommended Home health, which was arranged. On the day of the discharge the patient's vitals were stable, and no other acute medical condition were reported by patient. the patient was felt safe to be discharge at Home with Home health.  Consultants: Rheumatology and ID Procedures: None  Discharge Exam: General: Appear in no distress, no Rash; Oral Mucosa Clear, moist. Cardiovascular: S1 and S2 Present, no Murmur, Respiratory: normal respiratory effort, Bilateral Air entry present and no Crackles, no wheezes Abdomen: Bowel Sound present, Soft and no tenderness, no hernia Extremities: no Pedal edema, no calf tenderness Neurology: alert and oriented to time, place, and person affect appropriate.  Filed Weights   02/28/20 1153  Weight: 73.5 kg   Vitals:   03/06/20 1150 03/06/20 1217  BP:  (!) 156/68  Pulse: 89 70  Resp:  18  Temp:  98 F (36.7 C)  SpO2: 94% 96%    DISCHARGE MEDICATION: Allergies as of 03/06/2020      Reactions   Doxycycline Other (See Comments)   Thrush   Penicillins Hives, Palpitations, Rash   Azithromycin Other (See Comments)   Thrush   Levofloxacin Other (See Comments)   Joint pain/muscle pain   Sulfa Antibiotics Rash      Medication List    TAKE these medications   acetaminophen 325 MG tablet Commonly known as: TYLENOL Take 2 tablets (650 mg total) by mouth every 6 (six) hours as needed for mild pain or headache (fever >/= 101).   albuterol 108 (90 Base) MCG/ACT inhaler Commonly known as: VENTOLIN HFA Inhale 1-2 puffs into the lungs every 6 (six) hours as needed for wheezing or shortness of breath.   amLODipine 5 MG tablet Commonly known as: NORVASC Take 1 tablet (5 mg total) by mouth every evening. Start taking on: March 07, 2020   aspirin EC 81 MG tablet Take by mouth daily.   benzonatate 100 MG capsule Commonly known as: TESSALON Take 1 capsule (100 mg total) by mouth 3 (three) times daily as needed for cough.    calcium carbonate 1250 (500 Ca) MG tablet Commonly known as: OS-CAL - dosed in mg of elemental calcium Take 2 tablets by mouth daily with breakfast.   CO Q-10 PO Take by mouth daily.   doxycycline 100 MG tablet Commonly known as: VIBRA-TABS Take 1 tablet (100 mg total) by mouth every 12 (twelve) hours for 2 days.   ferrous sulfate 325 (65 FE) MG tablet Take 1 tablet (325 mg total) by mouth 2 (two) times daily with a meal.   fluticasone 50 MCG/ACT nasal spray Commonly known as: FLONASE Place 1 spray into both nostrils daily as needed for allergies or rhinitis.   guaiFENesin-dextromethorphan 100-10 MG/5ML syrup Commonly known as: ROBITUSSIN DM Take 5 mLs by mouth every 6 (six) hours as needed for cough.   hydroxychloroquine 200 MG tablet Commonly known as: PLAQUENIL Take by mouth.   levocetirizine 5 MG tablet Commonly known as: XYZAL Take 5 mg by mouth every evening.   lovastatin 20 MG tablet Commonly known as: MEVACOR Take 20 mg by mouth at bedtime.   magnesium gluconate 500 MG tablet Commonly known as: MAGONATE Take 500 mg by mouth daily.   metFORMIN 1000 MG tablet Commonly known as: GLUCOPHAGE Take 1 tablet (1,000 mg total) by mouth 2 (  two) times daily with a meal.   metoprolol tartrate 25 MG tablet Commonly known as: LOPRESSOR Take 1 tablet (25 mg total) by mouth 2 (two) times daily. What changed:   how much to take  how to take this  when to take this   montelukast 10 MG tablet Commonly known as: SINGULAIR Take 10 mg by mouth at bedtime.   multivitamin capsule Take 1 capsule by mouth daily.   nystatin 100000 UNIT/ML suspension Commonly known as: MYCOSTATIN Use as directed 5 mLs in the mouth or throat 4 (four) times daily. Twice daily   omeprazole 20 MG capsule Commonly known as: PRILOSEC Take 20 mg by mouth daily.   potassium chloride 10 MEQ tablet Commonly known as: KLOR-CON Take 10 mEq by mouth daily as needed.   predniSONE 20 MG  tablet Commonly known as: DELTASONE Take 3 tablets (60 mg total) by mouth daily with breakfast for 5 days. Start taking on: March 07, 2020   Qvar RediHaler 80 MCG/ACT inhaler Generic drug: beclomethasone Inhale 2 puffs into the lungs 2 (two) times daily.   Stiolto Respimat 2.5-2.5 MCG/ACT Aers Generic drug: Tiotropium Bromide-Olodaterol Inhale 2 puffs into the lungs daily.   telmisartan 40 MG tablet Commonly known as: MICARDIS Take 40 mg by mouth daily.   vitamin B-12 1000 MCG tablet Commonly known as: CYANOCOBALAMIN Take 1,000 mcg by mouth daily.   vitamin C 1000 MG tablet Take 1,000 mg by mouth daily.            Durable Medical Equipment  (From admission, onward)         Start     Ordered   03/06/20 1148  For home use only DME Walker rolling  Once    Question Answer Comment  Walker: With 5 Inch Wheels   Patient needs a walker to treat with the following condition Weakness      03/06/20 1148         Allergies  Allergen Reactions  . Doxycycline Other (See Comments)    Thrush  . Penicillins Hives, Palpitations and Rash  . Azithromycin Other (See Comments)    Thrush  . Levofloxacin Other (See Comments)    Joint pain/muscle pain  . Sulfa Antibiotics Rash   Discharge Instructions    Call MD for:   Complete by: As directed    Difficulty breathing   Call MD for:  temperature >100.4   Complete by: As directed    Diet - low sodium heart healthy   Complete by: As directed    Discharge instructions   Complete by: As directed    Follow with PCP in 1 week, continue to monitor blood pressure at home and follow with PCP to titrate medications accordingly Repeat CMP after 1 week   Increase activity slowly   Complete by: As directed       The results of significant diagnostics from this hospitalization (including imaging, microbiology, ancillary and laboratory) are listed below for reference.    Significant Diagnostic Studies: DG Chest 2 View  Result  Date: 02/28/2020 CLINICAL DATA:  Fever EXAM: CHEST - 2 VIEW COMPARISON:  12/12/2019 FINDINGS: The heart size and mediastinal contours are within normal limits. Previously seen hazy interstitial opacities on prior x-ray have largely resolved. There is minimal persistent interstitial prominence in the left lung base without focal airspace consolidation. No pleural effusion or pneumothorax. IMPRESSION: No active cardiopulmonary disease. Electronically Signed   By: Davina Poke D.O.   On: 02/28/2020 12:46  CT CHEST W CONTRAST  Result Date: 03/02/2020 CLINICAL DATA:  Fever of unknown origin. Malaise. History of neck surgery. Increasing shortness of breath. EXAM: CT CHEST, ABDOMEN, AND PELVIS WITH CONTRAST TECHNIQUE: Multidetector CT imaging of the chest, abdomen and pelvis was performed following the standard protocol during bolus administration of intravenous contrast. CONTRAST:  18m OMNIPAQUE IOHEXOL 300 MG/ML  SOLN COMPARISON:  Chest CTA 11/20/2019. FINDINGS: CT CHEST FINDINGS Cardiovascular: No significant vascular findings. No evidence of pulmonary embolism on nondedicated imaging. The heart size is normal. There is no pericardial effusion. Mediastinum/Nodes: There are no enlarged mediastinal, hilar or axillary lymph nodes. The thyroid gland, trachea and esophagus demonstrate no significant findings. Lungs/Pleura: Right greater than left dependent pleural effusions have mildly enlarged. Increased dependent opacities in both lower lobes and in the right middle lobe likely represent atelectasis, and there is volume loss in the middle lobe. Scattered ground-glass opacities elsewhere in the lungs are unchanged from the recent chest CT. Musculoskeletal/Chest wall: No chest wall mass or suspicious osseous findings. Postsurgical changes in the lower cervical spine, incompletely visualized. CT ABDOMEN AND PELVIS FINDINGS Hepatobiliary: The liver is normal in density without suspicious focal abnormality. Mild  nonspecific pericholecystic fluid without significant gallbladder wall thickening or calcified gallstones. No biliary dilatation. Pancreas: Unremarkable. No pancreatic ductal dilatation or surrounding inflammatory changes. Spleen: Normal in size without focal abnormality. Adrenals/Urinary Tract: Both adrenal glands appear normal. Mild symmetric perinephric soft tissue stranding bilaterally. Tiny cyst in the lower pole of the left kidney. No evidence of urinary tract calculus or hydronephrosis. The bladder appears normal. Stomach/Bowel: No evidence of bowel wall thickening, distention or surrounding inflammatory change. The appendix is not clearly seen. There are no pericecal inflammatory changes to suggest appendicitis. Vascular/Lymphatic: There are no enlarged abdominal or pelvic lymph nodes. Aortic and branch vessel atherosclerosis without acute vascular findings. The portal, superior mesenteric and splenic veins are patent. There are multiple gonadal vein phleboliths bilaterally. Reproductive: The uterus and ovaries appear normal. No adnexal mass. Other: Mild generalized soft tissue edema with trace pelvic ascites. No focal extraluminal fluid collection. Musculoskeletal: No acute or significant osseous findings. Moderate lumbar spondylosis. IMPRESSION: 1. Mildly enlarged bilateral pleural effusions and increased dependent opacities in both lower lobes and in the right middle lobe, most consistent with atelectasis. Scattered ground-glass opacities elsewhere in the lungs are unchanged from recent chest CT. No focal airspace disease. 2. Mild nonspecific pericholecystic fluid without significant gallbladder wall thickening or calcified gallstones. No biliary dilatation. 3. Mild generalized soft tissue edema with trace pelvic ascites. 4. Aortic Atherosclerosis (ICD10-I70.0). Electronically Signed   By: WRichardean SaleM.D.   On: 03/02/2020 14:51   CT CERVICAL SPINE WO CONTRAST  Result Date: 03/02/2020 CLINICAL  DATA:  Fever of unknown origin, history of surgery EXAM: CT CERVICAL SPINE WITHOUT CONTRAST TECHNIQUE: Multidetector CT imaging of the cervical spine was performed without intravenous contrast. Multiplanar CT image reconstructions were also generated. COMPARISON:  None. FINDINGS: Alignment: Anteroposterior alignment is maintained. Skull base and vertebrae: Postoperative changes of anterior fusion at C6-C7 with plate and screw fixation and interbody graft, which appears incorporated. There is associated streak artifact. No evidence of loosening. Vertebral body heights are maintained. Soft tissues and spinal canal: No prevertebral fluid or swelling. No visible epidural collection. Disc levels: C2-C3: Right greater than left facet hypertrophy. No significant stenosis. C3-C4: Small disc bulge with endplate osteophytes and right greater than left facet and uncovertebral hypertrophy. No significant canal or left foraminal stenosis. Right foraminal narrowing is present.  C4-C5: Small disc bulge with endplate osteophytes uncovertebral and facet hypertrophy. No significant canal stenosis. There is left greater than right foraminal narrowing. C5-C6: Small disc bulge with endplate osteophytes and facet and uncovertebral hypertrophy. No significant canal or left foraminal stenosis. Right foraminal narrowing is present. C6-C7: Operative level. Small endplate osteophytes and facet hypertrophy. No significant stenosis. C7-T1:  Facet hypertrophy.  No significant stenosis. Upper chest: Negative. Other: None. IMPRESSION: No evidence of infectious process involving the cervical spine. Electronically Signed   By: Macy Mis M.D.   On: 03/02/2020 11:45   CT ABDOMEN PELVIS W CONTRAST  Result Date: 03/02/2020 CLINICAL DATA:  Fever of unknown origin. Malaise. History of neck surgery. Increasing shortness of breath. EXAM: CT CHEST, ABDOMEN, AND PELVIS WITH CONTRAST TECHNIQUE: Multidetector CT imaging of the chest, abdomen and pelvis  was performed following the standard protocol during bolus administration of intravenous contrast. CONTRAST:  120m OMNIPAQUE IOHEXOL 300 MG/ML  SOLN COMPARISON:  Chest CTA 11/20/2019. FINDINGS: CT CHEST FINDINGS Cardiovascular: No significant vascular findings. No evidence of pulmonary embolism on nondedicated imaging. The heart size is normal. There is no pericardial effusion. Mediastinum/Nodes: There are no enlarged mediastinal, hilar or axillary lymph nodes. The thyroid gland, trachea and esophagus demonstrate no significant findings. Lungs/Pleura: Right greater than left dependent pleural effusions have mildly enlarged. Increased dependent opacities in both lower lobes and in the right middle lobe likely represent atelectasis, and there is volume loss in the middle lobe. Scattered ground-glass opacities elsewhere in the lungs are unchanged from the recent chest CT. Musculoskeletal/Chest wall: No chest wall mass or suspicious osseous findings. Postsurgical changes in the lower cervical spine, incompletely visualized. CT ABDOMEN AND PELVIS FINDINGS Hepatobiliary: The liver is normal in density without suspicious focal abnormality. Mild nonspecific pericholecystic fluid without significant gallbladder wall thickening or calcified gallstones. No biliary dilatation. Pancreas: Unremarkable. No pancreatic ductal dilatation or surrounding inflammatory changes. Spleen: Normal in size without focal abnormality. Adrenals/Urinary Tract: Both adrenal glands appear normal. Mild symmetric perinephric soft tissue stranding bilaterally. Tiny cyst in the lower pole of the left kidney. No evidence of urinary tract calculus or hydronephrosis. The bladder appears normal. Stomach/Bowel: No evidence of bowel wall thickening, distention or surrounding inflammatory change. The appendix is not clearly seen. There are no pericecal inflammatory changes to suggest appendicitis. Vascular/Lymphatic: There are no enlarged abdominal or pelvic  lymph nodes. Aortic and branch vessel atherosclerosis without acute vascular findings. The portal, superior mesenteric and splenic veins are patent. There are multiple gonadal vein phleboliths bilaterally. Reproductive: The uterus and ovaries appear normal. No adnexal mass. Other: Mild generalized soft tissue edema with trace pelvic ascites. No focal extraluminal fluid collection. Musculoskeletal: No acute or significant osseous findings. Moderate lumbar spondylosis. IMPRESSION: 1. Mildly enlarged bilateral pleural effusions and increased dependent opacities in both lower lobes and in the right middle lobe, most consistent with atelectasis. Scattered ground-glass opacities elsewhere in the lungs are unchanged from recent chest CT. No focal airspace disease. 2. Mild nonspecific pericholecystic fluid without significant gallbladder wall thickening or calcified gallstones. No biliary dilatation. 3. Mild generalized soft tissue edema with trace pelvic ascites. 4. Aortic Atherosclerosis (ICD10-I70.0). Electronically Signed   By: WRichardean SaleM.D.   On: 03/02/2020 14:51   CT L-SPINE NO CHARGE  Result Date: 03/02/2020 CLINICAL DATA:  Fever of unknown origin EXAM: CT LUMBAR SPINE WITHOUT CONTRAST TECHNIQUE: Multidetector CT imaging of the lumbar spine was performed without intravenous contrast administration. Multiplanar CT image reconstructions were also generated. COMPARISON:  None.  FINDINGS: Segmentation: 5 lumbar type vertebrae. Alignment: Mild retrolisthesis at L3-L4. Vertebrae: No compression deformity. Mild degenerative endplate irregularity. No destructive osseous lesion. Paraspinal and other soft tissues: Unremarkable. Refer to dedicated body imaging for better evaluation of extra-spinal findings. Disc levels: L1-L2:  No significant stenosis. L2-L3: Probable small disc bulge with mild endplate osteophytic ridging. Facet hypertrophy. No significant stenosis. L3-L4: Probable disc bulge with endplate  osteophytic ridging. Facet hypertrophy. Mild canal stenosis. Right foraminal stenosis is present. L4-L5: Probable small disc bulge with mild endplate osteophytic ridging. Facet hypertrophy. No significant stenosis. L5-S1: Probable small disc bulge with mild endplate osteophytic ridging. Facet hypertrophy. No significant canal stenosis. Left foraminal stenosis is present. IMPRESSION: No evidence of infectious process of the lumbar spine. Electronically Signed   By: Macy Mis M.D.   On: 03/02/2020 11:57   MM 3D SCREEN BREAST BILATERAL  Result Date: 02/21/2020 CLINICAL DATA:  Screening. EXAM: DIGITAL SCREENING BILATERAL MAMMOGRAM WITH TOMO AND CAD COMPARISON:  Previous exam(s). ACR Breast Density Category c: The breast tissue is heterogeneously dense, which may obscure small masses. FINDINGS: There are no findings suspicious for malignancy. Images were processed with CAD. IMPRESSION: No mammographic evidence of malignancy. A result letter of this screening mammogram will be mailed directly to the patient. RECOMMENDATION: Screening mammogram in one year. (Code:[id number]) BI-RADS CATEGORY  1: Negative. Electronically Signed   By: Lovey Newcomer M.D.   On: 02/21/2020 16:36   US Abdomen Limited RUQ  Result Date: 03/05/2020 CLINICAL DATA:  Elevated LFTs EXAM: ULTRASOUND ABDOMEN LIMITED RIGHT UPPER QUADRANT COMPARISON:  None. FINDINGS: Gallbladder: No gallstones or wall thickening visualized. No sonographic Murphy sign noted by sonographer. Common bile duct: Diameter: 7.4 mm Liver: Increased echotexture seen throughout. No focal abnormality or biliary ductal dilatation. Portal vein is patent on color Doppler imaging with normal direction of blood flow towards the liver. Other: There is a small right pleural effusion present. IMPRESSION: Hepatic steatosis. Normal gallbladder. Small right pleural effusion. Electronically Signed   By: Prudencio Pair M.D.   On: 03/05/2020 15:40    Microbiology: Recent Results (from the  past 240 hour(s))  Blood Culture (routine x 2)     Status: None   Collection Time: 02/28/20  5:33 PM   Specimen: BLOOD  Result Value Ref Range Status   Specimen Description BLOOD BLOOD RIGHT ARM  Final   Special Requests   Final    BOTTLES DRAWN AEROBIC AND ANAEROBIC Blood Culture results may not be optimal due to an inadequate volume of blood received in culture bottles   Culture   Final    NO GROWTH 5 DAYS Performed at [facility], [address] [place], [place] [address]    Report Status 03/04/2020 FINAL  Final  Blood Culture (routine x 2)     Status: None   Collection Time: 02/28/20  5:55 PM   Specimen: BLOOD  Result Value Ref Range Status   Specimen Description BLOOD BLOOD RIGHT WRIST  Final   Special Requests   Final    BOTTLES DRAWN AEROBIC AND ANAEROBIC Blood Culture results may not be optimal due to an inadequate volume of blood received in culture bottles   Culture   Final    NO GROWTH 5 DAYS Performed at [facility], [address], [place], [place] [address]    Report Status 03/04/2020 FINAL  Final  Respiratory Panel by RT PCR (Flu A&B, Covid) - Nasopharyngeal Swab     Status: None   Collection Time: 02/28/20  6:03 PM   Specimen: Nasopharyngeal Swab  Result Value Ref Range Status   SARS Coronavirus 2 by RT PCR NEGATIVE NEGATIVE Final    Comment: (NOTE) SARS-CoV-2 target nucleic acids are NOT DETECTED. The SARS-CoV-2 RNA is generally detectable in upper respiratoy specimens during the acute phase of infection. The lowest concentration of SARS-CoV-2 viral copies this assay can detect is 131 copies/mL. A negative result does not preclude SARS-Cov-2 infection and should not be used as the sole basis for treatment or other patient management decisions. A negative result may occur with  improper specimen collection/handling, submission of specimen other than nasopharyngeal swab, presence of viral mutation(s) within the areas targeted by this  assay, and inadequate number of viral copies (<131 copies/mL). A negative result must be combined with clinical observations, patient history, and epidemiological information. The expected result is Negative. Fact Sheet for Patients:  PinkCheek.be Fact Sheet for Healthcare Providers:  GravelBags.it This test is not yet ap proved or cleared by the [place] FDA and  has been authorized for detection and/or diagnosis of SARS-CoV-2 by FDA under an Emergency Use Authorization (EUA). This EUA will remain  in effect (meaning this test can be used) for the duration of the COVID-19 declaration under Section 564(b)(1) of the Act, 21 U.S.C. section 360bbb-3(b)(1), unless the authorization is terminated or revoked sooner.    Influenza A by PCR NEGATIVE NEGATIVE Final   Influenza B by PCR NEGATIVE NEGATIVE Final    Comment: (NOTE) The Xpert Xpress SARS-CoV-2/FLU/RSV assay is intended as an aid in  the diagnosis of influenza from Nasopharyngeal swab specimens and  should not be used as a sole basis for treatment. Nasal washings and  aspirates are unacceptable for Xpert Xpress SARS-CoV-2/FLU/RSV  testing. Fact Sheet for Patients: PinkCheek.be Fact Sheet for Healthcare Providers: GravelBags.it This test is not yet approved or cleared by the [place] FDA and  has been authorized for detection and/or diagnosis of SARS-CoV-2 by  FDA under an Emergency Use Authorization (EUA). This EUA will remain  in effect (meaning this test can be used) for the duration of the  Covid-19 declaration under Section 564(b)(1) of the Act, 21  U.S.C. section 360bbb-3(b)(1), unless the authorization is  terminated or revoked. Performed at [facility], [address] [place], [place] [address]   CSF culture     Status: None   Collection Time: 02/28/20 10:15 PM   Specimen: CSF;  Cerebrospinal Fluid  Result Value Ref Range Status   Specimen Description   Final    CSF Performed at [facility], [address], [place], [place] [address]    Special Requests   Final    Immunocompromised Performed at [facility], [address], [place], [place] [address]    Gram Stain   Final    WBC SEEN NO ORGANISMS SEEN Performed at [facility], [address], [place], [place] [address]    Culture   Final    NO GROWTH 3 DAYS Performed at [facility], [place] [address] [place], [place] [address]    Report Status 03/03/2020 FINAL  Final  Culture, fungus without smear     Status: None (Preliminary result)   Collection Time: 02/28/20 10:15 PM   Specimen: CSF; Cerebrospinal Fluid  Result Value Ref Range Status   Specimen Description   Final    CSF Performed at [facility], [address], [place],  [address]    Special Requests ADDED [phone] 03/03/2020  Final   Culture   Final    NO GROWTH 2 DAYS Performed at [facility], [place] [address], [place], [place] [address]    Report Status PENDING  Incomplete  MRSA PCR Screening     Status: None   Collection Time: 03/02/20  5:24 PM   Specimen: Nasopharyngeal  Result Value Ref Range Status   MRSA by PCR NEGATIVE NEGATIVE Final    Comment:        The GeneXpert MRSA Assay (FDA approved for NASAL specimens only), is one component of a comprehensive MRSA colonization surveillance program. It is not intended to diagnose MRSA infection nor to guide or monitor treatment for MRSA infections. Performed at [facility], [address] [place], [place] [address]      Labs: CBC: Recent Labs  Lab 02/29/20 0538 03/02/20 1033 03/06/20 0343  WBC 11.2* 8.9 6.8  NEUTROABS  --  7.5  --   HGB 10.9* 10.1* 9.2*  HCT 33.6* 31.5* 28.6*  MCV 91.1 91.6 91.4  PLT 218 206 546*   Basic Metabolic Panel: Recent Labs  Lab 02/29/20 0538 03/01/20 0606 03/02/20 0607  03/05/20 1231 03/06/20 0343  NA 134*  --  133* 139 140  K 3.1*  --  4.6 4.1 4.1  CL 103  --  105 105 105  CO2 20*  --  '22 24 26  ' GLUCOSE 121*  --  136* 159* 162*  BUN 19  --  '19 14 17  ' CREATININE 1.34* 1.16* 1.17* 0.79 0.77  CALCIUM 8.3*  --  8.0* 8.5* 8.7*  MG  --   --   --   --  2.2  PHOS  --   --   --   --  3.0   Liver Function Tests: Recent Labs  Lab 02/29/20 1530 03/02/20 0607 03/04/20 1559 03/05/20 1231 03/06/20 0343  AST 28 71* 86* 51* 36  ALT 23 73* 147* 127* 101*  ALKPHOS 77 153* 401* 433* 357*  BILITOT 1.0 0.9 1.2 1.4* 0.8  PROT 6.1* 5.8* 5.8* 6.0* 5.7*  ALBUMIN 3.1* 2.7* 2.6* 2.6* 2.4*   No results for input(s): LIPASE, AMYLASE in the last 168 hours. No results for input(s): AMMONIA in the last 168 hours. Cardiac Enzymes: No results for input(s): CKTOTAL, CKMB, CKMBINDEX, TROPONINI in the last 168 hours. BNP (last 3 results) No results for input(s): BNP in the last 8760 hours. CBG: Recent Labs  Lab 03/01/20 1035  GLUCAP 160*    Time spent: 35 minutes  Signed:  Val Riles  [facility]  03/06/2020 2:30 PM

## 2020-03-06 NOTE — TOC Initial Note (Addendum)
Transition of Care [facility]) - Initial/Assessment Note    Patient Details  Name: Misty Briggs MRN: [id number] Date of Birth: 1951-04-18  Transition of Care [facility]) CM/SW Contact:    Beverly Sessions, RN Phone Number: 03/06/2020, 11:26 AM  Clinical Narrative:                 Patient admitted for fever of unknown origin.  Per MD patient to discharge home today  Patient lives at home with husband and son  PCP Kary Kos - States her and her husband alternate driving  Ph[facility]acy [facility] [place] - denies issues obtaining medications  Patient states that she has a cane, RW, shower seat.  PT has assessed patient and recommends home health PT.  Patient states that she is in agreement and does not have a preference of agency.  CMS Medicare.gov Compare Post Acute Care list reviewed with patient. Referral to [facility] with [facility]  Patient does not qualify for home O2.   Patient states "my husband inogen concentrator, home concentrator, and portable tanks, I just borrow some of his when I feel like it". MD notified.  Patient to follow up with pulmonology on 03/13/20  UPDATE:  RW that patient had was older and belonged to her mother. Brad with [employer] has delivered RW to bedside   Expected Discharge Plan: [place] Barriers to Discharge: No Barriers Identified   Patient Goals and CMS Choice     Choice offered to / list presented to : Patient  Expected Discharge Plan and Services Expected Discharge Plan: [place]   Discharge Planning Services: CM Consult Post Acute Care Choice: [place] arrangements for the past 2 months: Single Family Home                           HH Arranged: PT [place]: [place] Date [place]: 03/06/20 Time HH Agency Contacted: 66 Representative spoke with at [place]: Tommi Rumps  Prior Living Arrangements/Services Living arrangements for the past 2 months: Single Family Home Lives with::  Spouse Patient language and need for interpreter reviewed:: Yes Do you feel safe going back to the place where you live?: Yes      Need for Family Participation in Patient Care: Yes (Comment) Care giver support system in place?: Yes (comment) Current home services: DME Criminal Activity/Legal Involvement Pertinent to Current Situation/Hospitalization: No - Comment as needed  Activities of Daily Living Home Assistive Devices/Equipment: None ADL Screening (condition at time of admission) Patient's cognitive ability adequate to safely complete daily activities?: Yes Is the patient deaf or have difficulty hearing?: No Does the patient have difficulty seeing, even when wearing glasses/contacts?: No Does the patient have difficulty concentrating, remembering, or making decisions?: No Patient able to express need for assistance with ADLs?: No Does the patient have difficulty dressing or bathing?: No Independently performs ADLs?: Yes (appropriate for developmental age) Does the patient have difficulty walking or climbing stairs?: Yes Weakness of Legs: Both Weakness of Arms/Hands: Both  Permission Sought/Granted                  Emotional Assessment Appearance:: Appears stated age     Orientation: : Oriented to Self, Oriented to Place, Oriented to  Time, Oriented to Situation   Psych Involvement: No (comment)  Admission diagnosis:  Fever of unknown origin [R50.9] Generalized weakness [R53.1] Fever in adult [R50.9] Patient Active Problem List  Diagnosis Date Noted  . Sepsis (HCC) 02/29/2020  . AKI (acute kidney injury) (HCC) 02/29/2020  . Hypotension 02/29/2020  . COPD (chronic obstructive pulmonary disease) (HCC) 02/29/2020  . HLD (hyperlipidemia) 02/29/2020  . Fever of unknown origin 02/28/2020  . Acute respiratory failure with hypoxia (HCC) 09/26/2019  . Pneumonia due to COVID-19 virus 09/26/2019  . COPD exacerbation (HCC) 11/17/2018  . Asthma without status asthmaticus  09/04/2015  . Arthritis, degenerative 09/04/2015  . Essential (primary) hypertension 07/01/2015  . Gastro-esophageal reflux disease without esophagitis 07/01/2015  . Combined fat and carbohydrate induced hyperlipemia 07/01/2015  . Rheumatoid arthritis involving multiple joints (HCC) 09/10/2014   PCP:  Jerl Mina, MD Pharmacy:   [facility] [address], [place] - [address] [place] [address] Phone: [phone] Fax: [phone]  [facility] [address], [place] - [address] [place] [address] Phone: [phone] Fax: [phone]     Social Determinants of Health (SDOH) Interventions    Readmission Risk Interventions Readmission Risk Prevention Plan 03/06/2020  Transportation Screening Complete  PCP or Specialist Appt within 3-5 Days Complete  HRI or Home Care Consult Complete  Palliative Care Screening Not Applicable  Medication Review (RN Care Manager) Complete  Some recent data might be hidden

## 2020-03-10 LAB — EPSTEIN BARR VRS(EBV DNA BY PCR)
EBV DNA QN by PCR: 121 copies/mL
log10 EBV DNA Qn PCR: 2.083 log10 copy/mL

## 2020-03-13 ENCOUNTER — Encounter: Payer: Self-pay | Admitting: Pulmonary Disease

## 2020-03-13 ENCOUNTER — Ambulatory Visit (INDEPENDENT_AMBULATORY_CARE_PROVIDER_SITE_OTHER): Payer: Medicare Other | Admitting: Pulmonary Disease

## 2020-03-13 ENCOUNTER — Other Ambulatory Visit: Payer: Self-pay

## 2020-03-13 VITALS — BP 174/64 | HR 77 | Temp 97.5°F | Ht 65.0 in | Wt 159.8 lb

## 2020-03-13 DIAGNOSIS — M069 Rheumatoid arthritis, unspecified: Secondary | ICD-10-CM

## 2020-03-13 DIAGNOSIS — R0602 Shortness of breath: Secondary | ICD-10-CM

## 2020-03-13 DIAGNOSIS — J449 Chronic obstructive pulmonary disease, unspecified: Secondary | ICD-10-CM

## 2020-03-13 DIAGNOSIS — Z8616 Personal history of COVID-19: Secondary | ICD-10-CM

## 2020-03-13 MED ORDER — BREZTRI AEROSPHERE 160-9-4.8 MCG/ACT IN AERO: 2.0000 | INHALATION_SPRAY | Freq: Two times a day (BID) | RESPIRATORY_TRACT | 0 refills | Status: DC

## 2020-03-13 MED ORDER — ALBUTEROL SULFATE (2.5 MG/3ML) 0.083% IN NEBU: 2.5000 mg | INHALATION_SOLUTION | Freq: Four times a day (QID) | RESPIRATORY_TRACT | 12 refills | Status: DC | PRN

## 2020-03-13 NOTE — Progress Notes (Signed)
Subjective:    Patient ID: Misty Briggs, female    DOB: 04-Oct-1951, 69 y.o.   MRN: [id number]  HPI Misty Briggs is a 69 year old lifelong never smoker with significant passive smoke exposure in the past, who presents for follow-up on the issue of moderate persistent asthma with COPD features likely due to chronic airway remodeling.  This is a scheduled visit.  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  She has persistent shortness of breath and is using nebulizer treatments 3 times a day still with nonproductive cough.  Her rheumatoid arthritis medications are being adjusted by Dr. Jefm Bryant.  Does not voice any other complaint today.  She will need PFTs, these have been on a moratorium due to COVID-19.  Review of Systems A 10 point review of systems was performed and it is as noted above otherwise negative.  Patient Active Problem List   Diagnosis Date Noted  . History of COVID-19 11/03/2020  . Chronic hypoxemic respiratory failure ([place]) 11/03/2020  . Acute exacerbation of chronic obstructive pulmonary disease (COPD) ([place]) 10/29/2020  . AKI (acute kidney injury) ([place]) 10/29/2020  . Hypotension 10/29/2020  . Asthma-COPD overlap syndrome ([place]) 02/29/2020  . HLD (hyperlipidemia) 02/29/2020  . Arthritis, degenerative 09/04/2015  . Essential (primary) hypertension 07/01/2015  . Gastro-esophageal reflux disease without esophagitis 07/01/2015  . Combined fat and carbohydrate induced hyperlipemia 07/01/2015  . Rheumatoid arthritis involving multiple joints ([place]) 09/10/2014   .    Objective:   Physical Exam BP (!) 174/64 (BP Location: Right Arm, Cuff Size: Normal)   Pulse 77   Temp (!) 97.5 F (36.4 C) (Temporal)   Ht 5\' 5"  (1.651 m)   Wt 159 lb 12.8 oz (72.5 kg)   SpO2 99%   BMI 26.59 kg/m   GENERAL: Awake,alert, anxious demeanor, fully ambulatory HEAD: Normocephalic, atraumatic.  EYES: Pupils equal,  round, reactive to light. No scleral icterus.  MOUTH: Nose/mouth/throat not examined due to masking requirements for COVID 19. NECK: Supple. No thyromegaly. No nodules. No JVD. No tracheal deviation. PULMONARY: No respiratory distress, occasional wheezing to auscultation bilaterally,no other adventitious sounds. CARDIOVASCULAR: S1 and S2. Regular rate and rhythm.No rubs murmurs or gallops heard. GASTROINTESTINAL: No distention noted MUSCULOSKELETAL: RA changes on both hands, no clubbing, no edema.  NEUROLOGIC: No focal deficits, fluent speech. SKIN: Intact,warm,dry. Limited exam shows no rashes PSYCH:Mood and behavior appropriate.      Assessment & Plan:     ICD-10-CM   1. Asthma-COPD overlap syndrome (HCC)  J44.9    Mostly element of moderate persistent asthma Element of COPD due to airway remodeling Trial of Breztri 2 puffs twice a day  2. Rheumatoid arthritis involving multiple joints (HCC)  M06.9    This issue has complexity to her management Complicates management of her pulmonary issues   3. Shortness of breath  R06.02    Persistent particularly after COVID-19  4. Personal history of covid-19  Z86.16    This issue adds complexity to her management Had recurrent infection Had respiratory sequela   Meds ordered this encounter  Medications  .  albuterol (PROVENTIL) (2.5 MG/3ML) 0.083% nebulizer solution    Sig: Take 3 mLs (2.5 mg total) by nebulization every 6 (six) hours as needed for wheezing or shortness of breath.    Dispense:  75 mL    Refill:  12    COPD J44.1  . Budeson-Glycopyrrol-Formoterol (BREZTRI AEROSPHERE) 160-9-4.8 MCG/ACT AERO    Sig: Inhale 2  puffs into the lungs in the morning and at bedtime.    Dispense:  5.9 g    Refill:  0    Order Specific Question:   Lot Number?    Answer:   [id number]    Order Specific Question:   Expiration Date?    Answer:   06/20/2021    Order Specific Question:   Manufacturer?    Answer:   [employer] [[id number]]    Order  Specific Question:   Quantity    Answer:   2   For her issues of persistent dyspnea and symptoms of moderate persistent asthma with COPD features we will give her a trial of Breztri 2 inhalations twice a day.  She does not do well with powdered inhalers.  She will rinse her mouth well after use.  She is to let us know of the [name] work so we can call in a prescription to her pharmacy.  She will be seen in follow-up in 4 to 6 weeks time she is to contact us prior to that time should any new difficulties arise.  Gailen Shelter, MD [facility]   *This note was dictated using voice recognition software/Dragon.  Despite best efforts to proofread, errors can occur which can change the meaning.  Any change was purely unintentional.

## 2020-03-13 NOTE — Patient Instructions (Signed)
We are going to give you a trial of Breztri 2 inhalations twice a day will be provided with a spacer.  Continue to rinse your mouth well after use this inhaler.  If the [name] works for you please let us know so we can call a prescription to your pharmacy.  Hold all other inhalers except for your rescue inhaler while using the Breztri.  We will see you in follow-up in 4 to 6 weeks time.  He did refill your albuterol nebulizer solution.

## 2020-03-20 ENCOUNTER — Telehealth: Payer: Self-pay | Admitting: Pulmonary Disease

## 2020-03-20 MED ORDER — ALBUTEROL SULFATE (2.5 MG/3ML) 0.083% IN NEBU: 2.5000 mg | INHALATION_SOLUTION | Freq: Four times a day (QID) | RESPIRATORY_TRACT | 2 refills | Status: DC

## 2020-03-20 NOTE — Telephone Encounter (Signed)
Spoke with pt and advised rx sent to pharmacy. Nothing further is needed.   

## 2020-03-24 LAB — CULTURE, FUNGUS WITHOUT SMEAR

## 2020-03-26 ENCOUNTER — Telehealth: Payer: Self-pay | Admitting: Pulmonary Disease

## 2020-03-26 MED ORDER — BREZTRI AEROSPHERE 160-9-4.8 MCG/ACT IN AERO: 2.0000 | INHALATION_SPRAY | Freq: Two times a day (BID) | RESPIRATORY_TRACT | 3 refills | Status: DC

## 2020-03-26 NOTE — Telephone Encounter (Signed)
Called and spoke with patients husband Onalee Hua per [employer]. Verified inhaler and pharmacy. RX sent to [facility]. Nothing further needed at this time

## 2020-04-22 ENCOUNTER — Ambulatory Visit: Payer: Medicare Other | Admitting: Pulmonary Disease

## 2020-04-22 ENCOUNTER — Other Ambulatory Visit: Payer: Self-pay

## 2020-04-22 ENCOUNTER — Encounter: Payer: Self-pay | Admitting: Pulmonary Disease

## 2020-04-22 VITALS — BP 160/90 | HR 85 | Temp 97.1°F | Ht 65.0 in | Wt 166.0 lb

## 2020-04-22 DIAGNOSIS — M069 Rheumatoid arthritis, unspecified: Secondary | ICD-10-CM

## 2020-04-22 DIAGNOSIS — R0602 Shortness of breath: Secondary | ICD-10-CM

## 2020-04-22 DIAGNOSIS — Z8616 Personal history of COVID-19: Secondary | ICD-10-CM

## 2020-04-22 DIAGNOSIS — J449 Chronic obstructive pulmonary disease, unspecified: Secondary | ICD-10-CM

## 2020-04-22 MED ORDER — BREZTRI AEROSPHERE 160-9-4.8 MCG/ACT IN AERO: 2.0000 | INHALATION_SPRAY | Freq: Two times a day (BID) | RESPIRATORY_TRACT | 0 refills | Status: DC

## 2020-04-22 NOTE — Progress Notes (Signed)
 Assessment & Plan:  1. 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 Rheumatoid arthritis involving multiple joints (HCC) Comments: This issue adds complexity to her management   3. Shortness of breath Comments: Resolved on Breztri   4. Personal history of covid-19 Comments: She had prolonged sequela from COVID-19  Discussion:  She appears to be markedly improved on Breztri  twice a day.  She did not tolerate powdered inhalers.  Still gets thrush but less bothersome than on other inhalers.  Her description of thrush is of small blisterlike lesions in her mouth.  Query aphthous ulcers.  She was instructed to avoid SLS in her toothpaste and personal hygiene products.  Sometimes these can lead to aphthous ulcers.  Continue to rinse after Breztri  with sodium bicarbonate in the rinsing water.  Also continue to use spacer for administration of brace 3.  We will see her in follow-up in 3 months time she is to contact us  prior to that time should any new difficulties arise.  Patient Instructions  Continue Breztri  2 inhalations twice a day.   We will see you in follow-up in 3 months time call sooner should any new difficulties arise.  Please note: late entry documentation due to logistical difficulties during COVID-19 pandemic. This note is filed for information purposes only, and is not intended to be used for billing, nor does it represent the full scope/nature of the visit in question. Please see any associated scanned media linked to date of encounter for additional pertinent information.  Subjective:    HPI: Misty Briggs is a 69 y.o. female presenting to the pulmonology clinic on 04/22/2020 with report of: Follow-up (Patient is doing much better since last visit. Patient is doing good with Breztri  but is going to be in  donut hole soon and it will be $700. Patient still has some shortness of breath but it has got better. Still using oxygen at night.)     Outpatient Encounter Medications as of 04/22/2020  Medication Sig Note   Ascorbic Acid  (VITAMIN C ) 1000 MG tablet Take 1,000 mg by mouth daily.    aspirin  EC 81 MG tablet Take by mouth daily.     calcium  carbonate (OS-CAL - DOSED IN MG OF ELEMENTAL CALCIUM ) 1250 (500 Ca) MG tablet Take 2 tablets by mouth daily with breakfast.    Coenzyme Q10 (CO Q-10 PO) Take by mouth daily.    ferrous sulfate  325 (65 FE) MG tablet Take 1 tablet (325 mg total) by mouth 2 (two) times daily with a meal.    fluticasone  (FLONASE ) 50 MCG/ACT nasal spray Place 1 spray into both nostrils daily as needed for allergies or rhinitis.    furosemide  (LASIX ) 20 MG tablet Take 20 mg by mouth daily.     gabapentin  (NEURONTIN ) 100 MG capsule Take 3 capsules by mouth at bedtime.    hydroxychloroquine  (PLAQUENIL ) 200 MG tablet Monday -Friday take 2 tablets Saturday and Sunday take 1 tablet 03/06/2024: Monday-Friday take 2 tablets Saturday and Sunday take 1 tablet   levocetirizine (XYZAL ) 5 MG tablet Take 5 mg by mouth every evening.    lovastatin (MEVACOR) 20 MG tablet Take 20 mg by mouth at bedtime.    metFORMIN  (GLUCOPHAGE ) 1000 MG tablet Take 1 tablet (1,000 mg total) by mouth 2 (two) times daily with a meal. (Patient taking differently: Take 500 mg by mouth 2 (two) times daily with  a meal.)    Multiple Vitamin (MULTIVITAMIN) capsule Take 1 capsule by mouth daily.    omeprazole (PRILOSEC) 20 MG capsule Take 20 mg by mouth daily.    vitamin B-12 (CYANOCOBALAMIN ) 1000 MCG tablet Take 1,000 mcg by mouth daily.    [DISCONTINUED] acetaminophen  (TYLENOL ) 325 MG tablet Take 2 tablets (650 mg total) by mouth every 6 (six) hours as needed for mild pain or headache (fever >/= 101).    [DISCONTINUED] albuterol  (PROVENTIL ) (2.5 MG/3ML) 0.083% nebulizer solution Take 3 mLs (2.5 mg total) by nebulization  every 6 (six) hours.    [DISCONTINUED] albuterol  (VENTOLIN  HFA) 108 (90 Base) MCG/ACT inhaler Inhale 1-2 puffs into the lungs every 6 (six) hours as needed for wheezing or shortness of breath.    [DISCONTINUED] amLODipine  (NORVASC ) 5 MG tablet Take 1 tablet (5 mg total) by mouth every evening.    [DISCONTINUED] Budeson-Glycopyrrol-Formoterol (BREZTRI  AEROSPHERE) 160-9-4.8 MCG/ACT AERO Inhale 2 puffs into the lungs in the morning and at bedtime.    [DISCONTINUED] Budeson-Glycopyrrol-Formoterol (BREZTRI  AEROSPHERE) 160-9-4.8 MCG/ACT AERO Inhale 2 puffs into the lungs in the morning and at bedtime.    [DISCONTINUED] magnesium  gluconate (MAGONATE) 500 MG tablet Take 500 mg by mouth daily.    [DISCONTINUED] metoprolol  tartrate (LOPRESSOR ) 25 MG tablet Take 1 tablet (25 mg total) by mouth 2 (two) times daily.    [DISCONTINUED] nystatin  (MYCOSTATIN ) 100000 UNIT/ML suspension Use as directed 5 mLs in the mouth or throat 4 (four) times daily. Twice daily    [DISCONTINUED] potassium chloride  (KLOR-CON ) 10 MEQ tablet Take 10 mEq by mouth daily as needed.    [DISCONTINUED] predniSONE  (DELTASONE ) 10 MG tablet Take 1 tablet by mouth daily. 10 mg then taper down to 5 mg    [DISCONTINUED] telmisartan (MICARDIS) 40 MG tablet Take 40 mg by mouth daily.    [DISCONTINUED] benzonatate  (TESSALON ) 100 MG capsule Take 1 capsule (100 mg total) by mouth 3 (three) times daily as needed for cough.    [DISCONTINUED] Budeson-Glycopyrrol-Formoterol (BREZTRI  AEROSPHERE) 160-9-4.8 MCG/ACT AERO Inhale 2 puffs into the lungs 2 (two) times daily.    [DISCONTINUED] guaiFENesin -dextromethorphan  (ROBITUSSIN DM) 100-10 MG/5ML syrup Take 5 mLs by mouth every 6 (six) hours as needed for cough.    [DISCONTINUED] montelukast  (SINGULAIR ) 10 MG tablet Take 10 mg by mouth at bedtime.    No facility-administered encounter medications on file as of 04/22/2020.      Objective:   Vitals:   04/22/20 1341  BP: (!) 160/90  Pulse: 85  Temp: (!)  97.1 F (36.2 C)  Height: 5' 5 (1.651 m)  Weight: 166 lb (75.3 kg)  SpO2: 98%  TempSrc: Temporal  BMI (Calculated): 27.62     Physical exam documentation is limited by delayed entry of information.

## 2020-04-22 NOTE — Patient Instructions (Signed)
Continue Breztri 2 inhalations twice a day.   We will see you in follow-up in 3 months time call sooner should any new difficulties arise.

## 2020-04-23 ENCOUNTER — Telehealth: Payer: Self-pay | Admitting: Pulmonary Disease

## 2020-04-23 NOTE — Telephone Encounter (Signed)
Patient filled [place] paperwork yesterday at appointment for [facility] due to her going into donut hole soon. Was faxed to [phone]. Confirmation received. Nothing further needed at this time

## 2020-07-24 ENCOUNTER — Other Ambulatory Visit: Payer: Self-pay

## 2020-07-24 ENCOUNTER — Ambulatory Visit (INDEPENDENT_AMBULATORY_CARE_PROVIDER_SITE_OTHER): Payer: Medicare Other | Admitting: Pulmonary Disease

## 2020-07-24 ENCOUNTER — Encounter: Payer: Self-pay | Admitting: Pulmonary Disease

## 2020-07-24 VITALS — BP 128/78 | HR 67 | Temp 98.0°F | Ht 66.0 in | Wt 168.2 lb

## 2020-07-24 DIAGNOSIS — J449 Chronic obstructive pulmonary disease, unspecified: Secondary | ICD-10-CM | POA: Diagnosis not present

## 2020-07-24 DIAGNOSIS — J9611 Chronic respiratory failure with hypoxia: Secondary | ICD-10-CM | POA: Diagnosis not present

## 2020-07-24 LAB — CBC WITH DIFFERENTIAL/PLATELET
Basophils Absolute: 0.1 10*3/uL (ref 0.0–0.1)
Basophils Relative: 0.7 % (ref 0.0–3.0)
Eosinophils Absolute: 0.4 10*3/uL (ref 0.0–0.7)
Eosinophils Relative: 5.4 % — ABNORMAL HIGH (ref 0.0–5.0)
HCT: 37.2 % (ref 36.0–46.0)
Hemoglobin: 12.2 g/dL (ref 12.0–15.0)
Lymphocytes Relative: 27.9 % (ref 12.0–46.0)
Lymphs Abs: 2.1 10*3/uL (ref 0.7–4.0)
MCHC: 32.8 g/dL (ref 30.0–36.0)
MCV: 89.7 fl (ref 78.0–100.0)
Monocytes Absolute: 0.7 10*3/uL (ref 0.1–1.0)
Monocytes Relative: 9.8 % (ref 3.0–12.0)
Neutro Abs: 4.3 10*3/uL (ref 1.4–7.7)
Neutrophils Relative %: 56.2 % (ref 43.0–77.0)
Platelets: 341 10*3/uL (ref 150.0–400.0)
RBC: 4.15 Mil/uL (ref 3.87–5.11)
RDW: 12.5 % (ref 11.5–15.5)
WBC: 7.6 10*3/uL (ref 4.0–10.5)

## 2020-07-24 MED ORDER — BREZTRI AEROSPHERE 160-9-4.8 MCG/ACT IN AERO: 2.0000 | INHALATION_SPRAY | Freq: Two times a day (BID) | RESPIRATORY_TRACT | 0 refills | Status: DC

## 2020-07-24 MED ORDER — BENZONATATE 100 MG PO CAPS: 100.0000 mg | ORAL_CAPSULE | Freq: Four times a day (QID) | ORAL | 1 refills | Status: DC | PRN

## 2020-07-24 NOTE — Patient Instructions (Signed)
Asthma-COPD --CONTINUE Breztri TWO puff TWICE a day --CONTINUE Albuterol as needed for shortness of breath or wheezing --Continue CBC with diff and IgE  Allergy --Weekly allergy shots  Rheumatoid Arthritis Previously on methotrexate --Plaquenil  Osteoporosis --Recommend minimizing steroid use if able  Follow-up in 3 months with me

## 2020-07-24 NOTE — Progress Notes (Signed)
Subjective:   PATIENT ID: Misty Briggs GENDER: female DOB: [birth year], MRN: [id number]   HPI  Chief Complaint  Patient presents with  . New Patient (Initial Visit)    SOB with allergies, dry cough, all have improved     Reason for Visit: Follow-up   [name] is 69 year old female with asthma-COPD, rheumatoid arthritis who presents for follow-up.  She was previously seen in [facility] in [place] by Dr. Jayme Cloud. Last visit on 05/12/20 was reviewed. She has tried multiple inhalers for her COPD and does not tolerate powder forms. She was recently tried on [place] and feels this inhaler is doing really well for her.   Today she reports she wishes continue the [facility] as she feels she is doing really well with it. She occasionally has a productive cough when laying flat. Denies wheezing, shortness of breath. She has not needed to use her albuterol inhaler. Symptoms usually worse with heat/changes in humidity. The fall is usually her worst season. She was previously on oxygen nightly after she was diagnosed with covid pneumonia. She has mild snoring but no witnessed episodes of apnea. Denies nocturnal dyspnea.   She was last hospitalized in April for sepsis secondary to ?infection vs inflammation and she was discharged on a prolonged steroid taper which she finally finished in July 2021. She was hospitalized for covid pneumonia in November and treated with steroids and remdesivir. Discharge summaries from 10/01/19 and 4/15 were reviewed.  Social History: Husband has COPD  I have personally reviewed patient's past medical/family/social history, allergies, current medications.  Past Medical History:  Diagnosis Date  . Arthritis   . Asthma   . COPD (chronic obstructive pulmonary disease) (HCC)    Dr. Meredeth Ide, [facility] pulmonologist  . Diabetes mellitus   . GERD (gastroesophageal reflux disease)   . Heart murmur   . HLD (hyperlipidemia)   . Hypertension    . Myocardial infarction (HCC)    mild  . PONV (postoperative nausea and vomiting)    itching sometimes  . Rheumatoid arthritis(714.0)   . Shortness of breath      Family History  Problem Relation Age of Onset  . Breast cancer Paternal Aunt      Social History   Occupational History  . Not on file  Tobacco Use  . Smoking status: Passive Smoke Exposure - Never Smoker  . Smokeless tobacco: Never Used  Vaping Use  . Vaping Use: Never used  Substance and Sexual Activity  . Alcohol use: Not Currently    Alcohol/week: 0.0 standard drinks  . Drug use: No  . Sexual activity: Not on file    Allergies  Allergen Reactions  . Doxycycline Other (See Comments)    Thrush  . Penicillins Hives, Palpitations and Rash  . Azithromycin Other (See Comments)    Thrush  . Levofloxacin Other (See Comments)    Joint pain/muscle pain  . Sulfa Antibiotics Rash     Outpatient Medications Prior to Visit  Medication Sig Dispense Refill  . acetaminophen (TYLENOL) 325 MG tablet Take 2 tablets (650 mg total) by mouth every 6 (six) hours as needed for mild pain or headache (fever >/= 101).    Marland Kitchen albuterol (PROVENTIL) (2.5 MG/3ML) 0.083% nebulizer solution Take 3 mLs (2.5 mg total) by nebulization every 6 (six) hours. 360 mL 2  . albuterol (VENTOLIN HFA) 108 (90 Base) MCG/ACT inhaler Inhale 1-2 puffs into the lungs every 6 (six) hours as needed for wheezing or  shortness of breath.    Marland Kitchen amLODipine (NORVASC) 5 MG tablet Take 1 tablet (5 mg total) by mouth every evening. 30 tablet 0  . Ascorbic Acid (VITAMIN C) 1000 MG tablet Take 1,000 mg by mouth daily.    Marland Kitchen aspirin EC 81 MG tablet Take by mouth daily.     . Budeson-Glycopyrrol-Formoterol (BREZTRI AEROSPHERE) 160-9-4.8 MCG/ACT AERO Inhale 2 puffs into the lungs in the morning and at bedtime. 10.7 g 3  . calcium carbonate (OS-CAL - DOSED IN MG OF ELEMENTAL CALCIUM) 1250 (500 Ca) MG tablet Take 2 tablets by mouth daily with breakfast.    . Coenzyme Q10  (CO Q-10 PO) Take by mouth daily.    . ferrous sulfate 325 (65 FE) MG tablet Take 1 tablet (325 mg total) by mouth 2 (two) times daily with a meal. 60 tablet 0  . fluticasone (FLONASE) 50 MCG/ACT nasal spray Place 1 spray into both nostrils daily as needed for allergies or rhinitis.    . furosemide (LASIX) 20 MG tablet Take 1 tablet by mouth daily.    Marland Kitchen gabapentin (NEURONTIN) 100 MG capsule Take 1 capsule by mouth at bedtime.    . hydroxychloroquine (PLAQUENIL) 200 MG tablet Take by mouth.    . levocetirizine (XYZAL) 5 MG tablet Take 5 mg by mouth every evening.    . lovastatin (MEVACOR) 20 MG tablet Take 20 mg by mouth at bedtime.    . magnesium gluconate (MAGONATE) 500 MG tablet Take 500 mg by mouth daily.    . metFORMIN (GLUCOPHAGE) 1000 MG tablet Take 1 tablet (1,000 mg total) by mouth 2 (two) times daily with a meal. 60 tablet 0  . metoprolol tartrate (LOPRESSOR) 25 MG tablet Take 1 tablet (25 mg total) by mouth 2 (two) times daily. 60 tablet 0  . Multiple Vitamin (MULTIVITAMIN) capsule Take 1 capsule by mouth daily.    Marland Kitchen nystatin (MYCOSTATIN) 100000 UNIT/ML suspension Use as directed 5 mLs in the mouth or throat 4 (four) times daily. Twice daily    . omeprazole (PRILOSEC) 20 MG capsule Take 20 mg by mouth daily.    . potassium chloride (KLOR-CON) 10 MEQ tablet Take 10 mEq by mouth daily as needed.    Marland Kitchen telmisartan (MICARDIS) 40 MG tablet Take 40 mg by mouth daily.    . vitamin B-12 (CYANOCOBALAMIN) 1000 MCG tablet Take 1,000 mcg by mouth daily.    . Budeson-Glycopyrrol-Formoterol (BREZTRI AEROSPHERE) 160-9-4.8 MCG/ACT AERO Inhale 2 puffs into the lungs 2 (two) times daily. 5.9 g 0  . predniSONE (DELTASONE) 10 MG tablet Take 1 tablet by mouth daily. 10 mg then taper down to 5 mg     No facility-administered medications prior to visit.    Review of Systems  Constitutional: Negative for chills, diaphoresis, fever, malaise/fatigue and weight loss.  HENT: Negative for congestion, ear pain  and sore throat.   Respiratory: Positive for cough and sputum production. Negative for hemoptysis, shortness of breath and wheezing.   Cardiovascular: Negative for chest pain, palpitations and leg swelling.  Gastrointestinal: Negative for abdominal pain, heartburn and nausea.  Genitourinary: Negative for frequency.  Musculoskeletal: Negative for joint pain and myalgias.  Skin: Negative for itching and rash.  Neurological: Negative for dizziness, weakness and headaches.  Endo/Heme/Allergies: Does not bruise/bleed easily.  Psychiatric/Behavioral: Negative for depression. The patient is not nervous/anxious.     Objective:   Vitals:   07/24/20 1419  BP: 128/78  Pulse: 67  Temp: 98 F (36.7 C)  TempSrc: Temporal  SpO2: 98%  Weight: 168 lb 3.2 oz (76.3 kg)  Height: 5\' 6"  (1.676 m)   SpO2: 98 % O2 Device: None (Room air)  Physical Exam: General: Well-appearing, no acute distress HENT: [place], AT Eyes: EOMI, no scleral icterus Respiratory: Clear to auscultation bilaterally.  No crackles, wheezing or rales Cardiovascular: RRR, -M/R/G, no JVD GI: BS+, soft, nontender Extremities:-Edema,-tenderness Neuro: AAO x4, CNII-XII grossly intact Skin: Intact, no rashes or bruising Psych: Normal mood, normal affect  Data Reviewed:  Imaging: CT Chest 03/02/20 - Subsegemental atelectasis. Bilateral pleural effusions. Unchanged ground glass opacities.   PFT: None on file  Labs: CBC    Component Value Date/Time   WBC 6.8 03/06/2020 0343   RBC 3.13 (L) 03/06/2020 0343   HGB 9.2 (L) 03/06/2020 0343   HCT 28.6 (L) 03/06/2020 0343   PLT 409 (H) 03/06/2020 0343   MCV 91.4 03/06/2020 0343   MCH 29.4 03/06/2020 0343   MCHC 32.2 03/06/2020 0343   RDW 13.9 03/06/2020 0343   LYMPHSABS 0.7 03/02/2020 1033   MONOABS 0.5 03/02/2020 1033   EOSABS 0.1 03/02/2020 1033   BASOSABS 0.0 03/02/2020 1033      Assessment & Plan:   Discussion: 69 year old female with asthma-COPD overlap who presents  for follow-up. In the last year she has had covid pneumonia requiring hospitalization twice in November 2020. She is tolerating Breztri with reduced bronchitis symptoms. We discussed pulmonary rehab however she declined at this time. We also discussed evaluation to determine eligibility for biologic agents for asthma. Will need high dose flu vaccine.  Asthma-COPD --CONTINUE Breztri TWO puff TWICE a day --CONTINUE Albuterol as needed for shortness of breath or wheezing --Continue CBC with diff and IgE --Declined pulmonary rehab. 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 Fluad Quad(high Dose 65+) 07/20/2019  . Moderna SARS-COVID-2 Vaccination 06/18/2020, 07/14/2020  . Pneumococcal Conjugate-13 01/23/2019  . Zoster Recombinat (Shingrix) 04/13/2019, 06/27/2019   CT Lung Screen - not qualified  Orders Placed This Encounter  Procedures  . CBC w/Diff    Standing Status:   Future    Standing Expiration Date:   07/24/2021  . IgE    Standing Status:   Future    Standing Expiration Date:   07/24/2021  . Pulse oximetry, overnight    Standing Status:   Future    Standing Expiration Date:   07/24/2021    Scheduling Instructions:     On room air   Meds ordered this encounter  Medications  . Budeson-Glycopyrrol-Formoterol (BREZTRI AEROSPHERE) 160-9-4.8 MCG/ACT AERO    Sig: Inhale 2 puffs into the lungs in the morning and at bedtime.    Dispense:  5.9 g    Refill:  0    Order Specific Question:   Lot Number?    Answer:   [id number]    Order Specific Question:   Expiration Date?    Answer:   11/21/2021    Order Specific Question:   Manufacturer?    Answer:   [employer] [[id number]]    Order  Specific Question:   Quantity    Answer:   4  . benzonatate (TESSALON) 100 MG capsule    Sig: Take 1 capsule (100 mg total) by mouth every 6 (six) hours as needed for cough.  Dispense:  30 capsule    Refill:  1    Return in about 1 month (around 08/23/2020) for with me.  I have spent a total time of 40-minutes on the day of the appointment reviewing prior documentation, coordinating care and discussing medical diagnosis and plan with the patient/family. Imaging, labs and tests included in this note have been reviewed and interpreted independently by me.  Finnlee Guarnieri Mechele Collin, MD [facility] 07/24/2020 3:00 PM  Office Number [phone]

## 2020-07-29 LAB — IGE: IgE (Immunoglobulin E), Serum: 36 kU/L (ref <=114)

## 2020-09-03 ENCOUNTER — Encounter: Payer: Self-pay | Admitting: Pulmonary Disease

## 2020-09-03 ENCOUNTER — Ambulatory Visit (INDEPENDENT_AMBULATORY_CARE_PROVIDER_SITE_OTHER): Payer: Medicare Other | Admitting: Pulmonary Disease

## 2020-09-03 ENCOUNTER — Other Ambulatory Visit: Payer: Self-pay

## 2020-09-03 VITALS — BP 130/80 | HR 84 | Temp 98.3°F | Ht 66.0 in | Wt 165.2 lb

## 2020-09-03 DIAGNOSIS — J449 Chronic obstructive pulmonary disease, unspecified: Secondary | ICD-10-CM

## 2020-09-03 DIAGNOSIS — Z8616 Personal history of COVID-19: Secondary | ICD-10-CM

## 2020-09-03 MED ORDER — BENZONATATE 100 MG PO CAPS: 100.0000 mg | ORAL_CAPSULE | Freq: Four times a day (QID) | ORAL | 3 refills | Status: DC | PRN

## 2020-09-03 NOTE — Progress Notes (Signed)
Subjective:   PATIENT ID: Misty Briggs GENDER: female DOB: Jan 19, 1951, MRN: [id number]   HPI  Chief Complaint  Patient presents with  . Follow-up    losing her voice.  this is her allergy season.  getting allergy injections weekly.    Reason for Visit: Follow-up   [name] is 69 year old female never smoker with asthma-COPD, rheumatoid arthritis and hx covid pneumonia 09/2019 who presents for follow-up.  Synopsis: She was previously seen in [facility] in [place] by Dr. Jayme Cloud. Last visit on 05/12/20 was reviewed. She has tried multiple inhalers for her COPD and does not tolerate powder forms. She recently tried on [place] and feels this inhaler is doing really well for her.   Since our last visit, she has been compliant with Breztri and rinses her mouth with baking soda. Using her spacer. She rarely uses her albuterol inhaler, usually during dry humidity and/or with exertion. Denies cough, wheezing or shortness of breath. She reports her nadir O2 is is 93%. She reports her breathing overall is slightly better however persistent. Her husband reports she snores and she has episodes of awakening at night with dyspnea. She recently took her flu shot and is up-to-date on vaccinations.    Social History: Husband has COPD  I have personally reviewed patient's past medical/family/social history/allergies/current medications.   Past Medical History:  Diagnosis Date  . Arthritis   . Asthma   . COPD (chronic obstructive pulmonary disease) (HCC)    Dr. Meredeth Ide, [facility] pulmonologist  . Diabetes mellitus   . Fever of unknown origin 02/28/2020  . GERD (gastroesophageal reflux disease)   . Heart murmur   . HLD (hyperlipidemia)   . Hypertension   . Myocardial infarction (HCC)    mild  . Pneumonia due to COVID-19 virus 09/26/2019  . PONV (postoperative nausea and vomiting)    itching sometimes  . Rheumatoid arthritis(714.0)   . Shortness of breath       Allergies  Allergen Reactions  . Doxycycline Other (See Comments)    Thrush  . Penicillins Hives, Palpitations and Rash  . Azithromycin Other (See Comments)    Thrush  . Levofloxacin Other (See Comments)    Joint pain/muscle pain  . Sulfa Antibiotics Rash     Outpatient Medications Prior to Visit  Medication Sig Dispense Refill  . acetaminophen (TYLENOL) 325 MG tablet Take 2 tablets (650 mg total) by mouth every 6 (six) hours as needed for mild pain or headache (fever >/= 101).    Marland Kitchen albuterol (PROVENTIL) (2.5 MG/3ML) 0.083% nebulizer solution Take 3 mLs (2.5 mg total) by nebulization every 6 (six) hours. 360 mL 2  . albuterol (VENTOLIN HFA) 108 (90 Base) MCG/ACT inhaler Inhale 1-2 puffs into the lungs every 6 (six) hours as needed for wheezing or shortness of breath.    . Ascorbic Acid (VITAMIN C) 1000 MG tablet Take 1,000 mg by mouth daily.    Marland Kitchen aspirin EC 81 MG tablet Take by mouth daily.     . benzonatate (TESSALON) 100 MG capsule Take 1 capsule (100 mg total) by mouth every 6 (six) hours as needed for cough. 30 capsule 1  . Budeson-Glycopyrrol-Formoterol (BREZTRI AEROSPHERE) 160-9-4.8 MCG/ACT AERO Inhale 2 puffs into the lungs in the morning and at bedtime. 10.7 g 3  . Budeson-Glycopyrrol-Formoterol (BREZTRI AEROSPHERE) 160-9-4.8 MCG/ACT AERO Inhale 2 puffs into the lungs in the morning and at bedtime. 5.9 g 0  . calcium carbonate (OS-CAL - DOSED IN  MG OF ELEMENTAL CALCIUM) 1250 (500 Ca) MG tablet Take 2 tablets by mouth daily with breakfast.    . Coenzyme Q10 (CO Q-10 PO) Take by mouth daily.    . fluticasone (FLONASE) 50 MCG/ACT nasal spray Place 1 spray into both nostrils daily as needed for allergies or rhinitis.    . furosemide (LASIX) 20 MG tablet Take 1 tablet by mouth daily.    Marland Kitchen gabapentin (NEURONTIN) 100 MG capsule Take 1 capsule by mouth at bedtime.    . hydroxychloroquine (PLAQUENIL) 200 MG tablet Take by mouth.    . levocetirizine (XYZAL) 5 MG tablet Take 5 mg by  mouth every evening.    . lovastatin (MEVACOR) 20 MG tablet Take 20 mg by mouth at bedtime.    . magnesium gluconate (MAGONATE) 500 MG tablet Take 500 mg by mouth daily.    . Multiple Vitamin (MULTIVITAMIN) capsule Take 1 capsule by mouth daily.    Marland Kitchen nystatin (MYCOSTATIN) 100000 UNIT/ML suspension Use as directed 5 mLs in the mouth or throat 4 (four) times daily. Twice daily    . omeprazole (PRILOSEC) 20 MG capsule Take 20 mg by mouth daily.    . potassium chloride (KLOR-CON) 10 MEQ tablet Take 10 mEq by mouth daily as needed.    Marland Kitchen telmisartan (MICARDIS) 40 MG tablet Take 40 mg by mouth daily.    . vitamin B-12 (CYANOCOBALAMIN) 1000 MCG tablet Take 1,000 mcg by mouth daily.    Marland Kitchen amLODipine (NORVASC) 5 MG tablet Take 1 tablet (5 mg total) by mouth every evening. 30 tablet 0  . ferrous sulfate 325 (65 FE) MG tablet Take 1 tablet (325 mg total) by mouth 2 (two) times daily with a meal. 60 tablet 0  . metFORMIN (GLUCOPHAGE) 1000 MG tablet Take 1 tablet (1,000 mg total) by mouth 2 (two) times daily with a meal. 60 tablet 0  . metoprolol tartrate (LOPRESSOR) 25 MG tablet Take 1 tablet (25 mg total) by mouth 2 (two) times daily. 60 tablet 0   No facility-administered medications prior to visit.    Review of Systems  Constitutional: Negative for chills, diaphoresis, fever, malaise/fatigue and weight loss.  HENT: Negative for congestion.   Respiratory: Negative for cough, hemoptysis, sputum production, shortness of breath and wheezing.   Cardiovascular: Negative for chest pain, palpitations and leg swelling.    Objective:   Vitals:   09/03/20 1433  BP: 130/80  Pulse: 84  Temp: 98.3 F (36.8 C)  TempSrc: Temporal  SpO2: 98%  Weight: 165 lb 3.2 oz (74.9 kg)  Height: 5\' 6"  (1.676 m)   SpO2: 98 % O2 Device: None (Room air)  Physical Exam: General: Well-appearing, no acute distress HENT: [place], AT, OP clear, MMM Eyes: EOMI, no scleral icterus Respiratory: Clear to auscultation bilaterally.   No crackles, wheezing or rales Cardiovascular: RRR, -M/R/G, no JVD Extremities:-Edema,-tenderness Neuro: AAO x4, CNII-XII grossly intact Skin: Intact, no rashes or bruising Psych: Normal mood, normal affect  Data Reviewed:  Imaging: CT Chest 03/02/20 - Subsegemental atelectasis. Bilateral pleural effusions. Unchanged ground glass opacities.   PFT: None on file  Labs: CBC    Component Value Date/Time   WBC 7.6 07/24/2020 1501   RBC 4.15 07/24/2020 1501   HGB 12.2 07/24/2020 1501   HCT 37.2 07/24/2020 1501   PLT 341.0 07/24/2020 1501   MCV 89.7 07/24/2020 1501   MCH 29.4 03/06/2020 0343   MCHC 32.8 07/24/2020 1501   RDW 12.5 07/24/2020 1501   LYMPHSABS 2.1 07/24/2020 1501  MONOABS 0.7 07/24/2020 1501   EOSABS 0.4 07/24/2020 1501   BASOSABS 0.1 07/24/2020 1501     Imaging, labs and test noted above have been reviewed independently by me.  Assessment & Plan:   Discussion: 69 year old female with asthma-COPD overlap and allergic rhinitis who presents for follow-up. In the last year she has had covid pneumonia requiring hospitalization twice since November 2020. Tolerating and having good response with Breztri. We have previously discussed evaluation to determine eligibility for biologic agents for asthma. No indication at this time since she is doing well on bronchodilators alone.  Asthma-COPD --CONTINUE Breztri TWO puff TWICE a day --CONTINUE Albuterol as needed for shortness of breath or wheezing --Declined pulmonary rehab. 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 If positive, will order sleep study   Allergy --Weekly allergy shots  Rheumatoid Arthritis Previously on methotrexate --Plaquenil  Osteoporosis --Recommend minimizing steroid use if able  Health Maintenance Immunization History  Administered Date(s) Administered  . Fluad Quad(high Dose 65+) 07/20/2019, 08/19/2020  .  Moderna SARS-COVID-2 Vaccination 06/18/2020, 07/14/2020  . Pneumococcal Conjugate-13 01/23/2019  . Zoster Recombinat (Shingrix) 04/13/2019, 06/27/2019   CT Lung Screen - not qualified  No orders of the defined types were placed in this encounter.  Meds ordered this encounter  Medications  . benzonatate (TESSALON) 100 MG capsule    Sig: Take 1 capsule (100 mg total) by mouth every 6 (six) hours as needed for cough.    Dispense:  60 capsule    Refill:  3    Return in about 6 months (around 03/04/2021).  I have spent a total time of 31-minutes on the day of the appointment reviewing prior documentation, coordinating care and discussing medical diagnosis and plan with the patient/family. Imaging, labs and tests included in this note have been reviewed and interpreted independently by me.  Norina Cowper Mechele Collin, MD [facility] 09/03/2020 2:47 PM  Office Number [phone]

## 2020-09-03 NOTE — Patient Instructions (Signed)
Asthma-COPD --CONTINUE Breztri TWO puff TWICE a day --CONTINUE Albuterol as needed for shortness of breath or wheezing --Declined pulmonary rehab. 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 If positive, will order sleep study  Follow-up in 6 months with me

## 2020-09-27 ENCOUNTER — Encounter: Payer: Self-pay | Admitting: Pulmonary Disease

## 2020-09-28 DIAGNOSIS — Z8616 Personal history of COVID-19: Secondary | ICD-10-CM

## 2020-09-28 DIAGNOSIS — J449 Chronic obstructive pulmonary disease, unspecified: Secondary | ICD-10-CM

## 2020-09-30 ENCOUNTER — Telehealth: Payer: Self-pay | Admitting: Pulmonary Disease

## 2020-09-30 NOTE — Telephone Encounter (Signed)
I called and spoke with Rod Holler with [employer] and she stated the ONO was done and the results were faxed to the [employer]. Office. Per Rod Holler the ONO results are too old now and will have to start over again with office visit and ONO ordered.  According to Rod Holler she did qualify for 02.  When did Dr. Everardo All come back from [facility]

## 2020-09-30 NOTE — Telephone Encounter (Signed)
Synetta Fail- were they going to refax the results since we did not receive them?

## 2020-10-01 NOTE — Telephone Encounter (Signed)
Yes they were suppose to be faxing them to the [place] office on [address]

## 2020-10-02 NOTE — Telephone Encounter (Signed)
Dr. Everardo All, we have received the ONO results from [facility]. This is dated 08/06/20 which is now too old.  Info from the ONO states time spent less than or equal to 88%: 9.70min, time spent less than or equal to 89%: 13.16min. High SpO2: 99% and low SpO2: 71%. Basal SpO2: 93.4%, Delta SpO2: 9.4 MIN, time consecutive less than or equal to 88%: 0.9 MIN, Awake SpO2: 98%, Artifact events: 0.2 MIN  SpO2 ODL Data: oxygen desaturation events (3%): 362 and oxygen desaturation index: 41  For considerations there is a message stated saying: it appears this patient qualifies for nocturnal oxygen per medicare guidelines, please inquire with respiratory company for coverage guidelines for Group 1.  Based off of info from ONO and also with date being too old now for [place] to send order to have pt started on O2, Dr. Everardo All, please advise what you want to do if you want another ONO to be ordered or if you want a sleep study to be ordered?

## 2020-10-02 NOTE — Telephone Encounter (Signed)
Has anyone gotten this ONO results yet

## 2020-10-02 NOTE — Telephone Encounter (Signed)
We have received the ONO results on pt by [facility]. The date on this when pt had the ONO performed was 08/06/20 so it is too old to get pt started on nocturnal O2.  Pt last saw Dr. Everardo All in office 09/03/20 and there is a note from this visit stating it ONO was positive, will order sleep study.  Since this OV is not yet 77days old yet, would this be okay to use if a new order was to be placed or would pt need to go ahead and have another OV?

## 2020-10-02 NOTE — Telephone Encounter (Signed)
All I know is there was a message or note from Dr. Everardo All asking about the ONO results that she didn't have on 09/03/20. I called [facility] about the results and they were faxing to the [facility]

## 2020-10-29 ENCOUNTER — Emergency Department: Payer: Medicare Other

## 2020-10-29 ENCOUNTER — Encounter: Payer: Self-pay | Admitting: Emergency Medicine

## 2020-10-29 ENCOUNTER — Other Ambulatory Visit: Payer: Self-pay

## 2020-10-29 ENCOUNTER — Inpatient Hospital Stay
Admission: EM | Admit: 2020-10-29 | Discharge: 2020-11-01 | DRG: 191 | Disposition: A | Discharge status: Home / Self Care | Payer: Medicare Other | Attending: Internal Medicine | Admitting: Internal Medicine

## 2020-10-29 DIAGNOSIS — R0902 Hypoxemia: Secondary | ICD-10-CM | POA: Diagnosis present

## 2020-10-29 DIAGNOSIS — Z20822 Contact with and (suspected) exposure to covid-19: Secondary | ICD-10-CM | POA: Diagnosis present

## 2020-10-29 DIAGNOSIS — I440 Atrioventricular block, first degree: Secondary | ICD-10-CM | POA: Diagnosis present

## 2020-10-29 DIAGNOSIS — E785 Hyperlipidemia, unspecified: Secondary | ICD-10-CM | POA: Diagnosis present

## 2020-10-29 DIAGNOSIS — E860 Dehydration: Secondary | ICD-10-CM | POA: Diagnosis present

## 2020-10-29 DIAGNOSIS — Z7982 Long term (current) use of aspirin: Secondary | ICD-10-CM | POA: Diagnosis not present

## 2020-10-29 DIAGNOSIS — Z881 Allergy status to other antibiotic agents status: Secondary | ICD-10-CM

## 2020-10-29 DIAGNOSIS — U099 Post covid-19 condition, unspecified: Secondary | ICD-10-CM | POA: Diagnosis present

## 2020-10-29 DIAGNOSIS — Z803 Family history of malignant neoplasm of breast: Secondary | ICD-10-CM | POA: Diagnosis not present

## 2020-10-29 DIAGNOSIS — M069 Rheumatoid arthritis, unspecified: Secondary | ICD-10-CM | POA: Diagnosis present

## 2020-10-29 DIAGNOSIS — Z880 Allergy status to penicillin: Secondary | ICD-10-CM | POA: Diagnosis not present

## 2020-10-29 DIAGNOSIS — N179 Acute kidney failure, unspecified: Secondary | ICD-10-CM | POA: Diagnosis present

## 2020-10-29 DIAGNOSIS — I5032 Chronic diastolic (congestive) heart failure: Secondary | ICD-10-CM | POA: Diagnosis present

## 2020-10-29 DIAGNOSIS — E119 Type 2 diabetes mellitus without complications: Secondary | ICD-10-CM | POA: Diagnosis present

## 2020-10-29 DIAGNOSIS — I959 Hypotension, unspecified: Secondary | ICD-10-CM | POA: Diagnosis present

## 2020-10-29 DIAGNOSIS — D649 Anemia, unspecified: Secondary | ICD-10-CM | POA: Diagnosis present

## 2020-10-29 DIAGNOSIS — Z79899 Other long term (current) drug therapy: Secondary | ICD-10-CM | POA: Diagnosis not present

## 2020-10-29 DIAGNOSIS — Z7984 Long term (current) use of oral hypoglycemic drugs: Secondary | ICD-10-CM

## 2020-10-29 DIAGNOSIS — Z888 Allergy status to other drugs, medicaments and biological substances status: Secondary | ICD-10-CM

## 2020-10-29 DIAGNOSIS — I2510 Atherosclerotic heart disease of native coronary artery without angina pectoris: Secondary | ICD-10-CM | POA: Diagnosis present

## 2020-10-29 DIAGNOSIS — Z981 Arthrodesis status: Secondary | ICD-10-CM

## 2020-10-29 DIAGNOSIS — J441 Chronic obstructive pulmonary disease with (acute) exacerbation: Principal | ICD-10-CM | POA: Diagnosis present

## 2020-10-29 DIAGNOSIS — R0600 Dyspnea, unspecified: Secondary | ICD-10-CM | POA: Diagnosis not present

## 2020-10-29 DIAGNOSIS — K219 Gastro-esophageal reflux disease without esophagitis: Secondary | ICD-10-CM | POA: Diagnosis present

## 2020-10-29 DIAGNOSIS — I110 Hypertensive heart disease with heart failure: Secondary | ICD-10-CM | POA: Diagnosis present

## 2020-10-29 DIAGNOSIS — Z882 Allergy status to sulfonamides status: Secondary | ICD-10-CM | POA: Diagnosis not present

## 2020-10-29 DIAGNOSIS — I69998 Other sequelae following unspecified cerebrovascular disease: Secondary | ICD-10-CM

## 2020-10-29 DIAGNOSIS — J45901 Unspecified asthma with (acute) exacerbation: Secondary | ICD-10-CM | POA: Diagnosis present

## 2020-10-29 DIAGNOSIS — I252 Old myocardial infarction: Secondary | ICD-10-CM

## 2020-10-29 DIAGNOSIS — R0602 Shortness of breath: Secondary | ICD-10-CM | POA: Diagnosis present

## 2020-10-29 DIAGNOSIS — G629 Polyneuropathy, unspecified: Secondary | ICD-10-CM | POA: Diagnosis present

## 2020-10-29 DIAGNOSIS — Z7722 Contact with and (suspected) exposure to environmental tobacco smoke (acute) (chronic): Secondary | ICD-10-CM | POA: Diagnosis present

## 2020-10-29 LAB — BASIC METABOLIC PANEL
Anion gap: 14 (ref 5–15)
BUN: 24 mg/dL — ABNORMAL HIGH (ref 8–23)
CO2: 25 mmol/L (ref 22–32)
Calcium: 9.2 mg/dL (ref 8.9–10.3)
Chloride: 99 mmol/L (ref 98–111)
Creatinine, Ser: 2.13 mg/dL — ABNORMAL HIGH (ref 0.44–1.00)
GFR, Estimated: 25 mL/min — ABNORMAL LOW (ref >60)
Glucose, Bld: 120 mg/dL — ABNORMAL HIGH (ref 70–99)
Potassium: 4.2 mmol/L (ref 3.5–5.1)
Sodium: 138 mmol/L (ref 135–145)

## 2020-10-29 LAB — HEPATIC FUNCTION PANEL
ALT: 19 U/L (ref 0–44)
AST: 30 U/L (ref 15–41)
Albumin: 3.9 g/dL (ref 3.5–5.0)
Alkaline Phosphatase: 97 U/L (ref 38–126)
Bilirubin, Direct: <0.1 mg/dL (ref 0.0–0.2)
Total Bilirubin: 0.7 mg/dL (ref 0.3–1.2)
Total Protein: 7.4 g/dL (ref 6.5–8.1)

## 2020-10-29 LAB — URINALYSIS, COMPLETE (UACMP) WITH MICROSCOPIC
Bilirubin Urine: NEGATIVE
Glucose, UA: NEGATIVE mg/dL
Hgb urine dipstick: NEGATIVE
Ketones, ur: NEGATIVE mg/dL
Leukocytes,Ua: NEGATIVE
Nitrite: NEGATIVE
Protein, ur: NEGATIVE mg/dL
Specific Gravity, Urine: 1.006 (ref 1.005–1.030)
pH: 5 (ref 5.0–8.0)

## 2020-10-29 LAB — CBC
HCT: 39.4 % (ref 36.0–46.0)
Hemoglobin: 12.9 g/dL (ref 12.0–15.0)
MCH: 28.5 pg (ref 26.0–34.0)
MCHC: 32.7 g/dL (ref 30.0–36.0)
MCV: 87 fL (ref 80.0–100.0)
Platelets: 280 10*3/uL (ref 150–400)
RBC: 4.53 MIL/uL (ref 3.87–5.11)
RDW: 12.5 % (ref 11.5–15.5)
WBC: 6.7 10*3/uL (ref 4.0–10.5)
nRBC: 0 % (ref 0.0–0.2)

## 2020-10-29 LAB — RESP PANEL BY RT-PCR (FLU A&B, COVID) ARPGX2
Influenza A by PCR: NEGATIVE
Influenza B by PCR: NEGATIVE
SARS Coronavirus 2 by RT PCR: NEGATIVE

## 2020-10-29 LAB — TROPONIN I (HIGH SENSITIVITY)
Troponin I (High Sensitivity): 5 ng/L (ref <18)
Troponin I (High Sensitivity): 5 ng/L (ref <18)

## 2020-10-29 LAB — SODIUM, URINE, RANDOM: Sodium, Ur: 51 mmol/L

## 2020-10-29 LAB — FIBRIN DERIVATIVES D-DIMER (ARMC ONLY): Fibrin derivatives D-dimer (ARMC): 1073.9 ng/mL (FEU) — ABNORMAL HIGH (ref 0.00–499.00)

## 2020-10-29 LAB — CREATININE, URINE, RANDOM: Creatinine, Urine: 54 mg/dL

## 2020-10-29 LAB — CBG MONITORING, ED: Glucose-Capillary: 75 mg/dL (ref 70–99)

## 2020-10-29 LAB — MAGNESIUM: Magnesium: 1.9 mg/dL (ref 1.7–2.4)

## 2020-10-29 LAB — CK: Total CK: 79 U/L (ref 38–234)

## 2020-10-29 MED ORDER — METHYLPREDNISOLONE SODIUM SUCC 40 MG IJ SOLR
40.0000 mg | Freq: Four times a day (QID) | INTRAMUSCULAR | Status: DC
  Administered 2020-10-29 – 2020-10-31 (×8): 40 mg via INTRAVENOUS
  Filled 2020-10-29 (×8): qty 1

## 2020-10-29 MED ORDER — HYDROXYCHLOROQUINE SULFATE 200 MG PO TABS
200.0000 mg | ORAL_TABLET | Freq: Every day | ORAL | Status: DC
  Administered 2020-10-30 – 2020-11-01 (×3): 200 mg via ORAL
  Filled 2020-10-29 (×4): qty 1

## 2020-10-29 MED ORDER — IPRATROPIUM-ALBUTEROL 0.5-2.5 (3) MG/3ML IN SOLN
3.0000 mL | Freq: Once | RESPIRATORY_TRACT | Status: AC
  Administered 2020-10-29: 3 mL via RESPIRATORY_TRACT

## 2020-10-29 MED ORDER — IPRATROPIUM-ALBUTEROL 0.5-2.5 (3) MG/3ML IN SOLN
3.0000 mL | Freq: Four times a day (QID) | RESPIRATORY_TRACT | Status: DC
  Administered 2020-10-29 – 2020-11-01 (×8): 3 mL via RESPIRATORY_TRACT
  Filled 2020-10-29 (×8): qty 3

## 2020-10-29 MED ORDER — SODIUM CHLORIDE 0.9 % IV BOLUS
1000.0000 mL | Freq: Once | INTRAVENOUS | Status: AC
  Administered 2020-10-29: 1000 mL via INTRAVENOUS

## 2020-10-29 MED ORDER — INSULIN ASPART 100 UNIT/ML ~~LOC~~ SOLN
0.0000 [IU] | Freq: Three times a day (TID) | SUBCUTANEOUS | Status: DC
  Administered 2020-10-30 – 2020-10-31 (×3): 2 [IU] via SUBCUTANEOUS
  Administered 2020-10-31: 5 [IU] via SUBCUTANEOUS
  Administered 2020-10-31: 12:00:00 2 [IU] via SUBCUTANEOUS
  Administered 2020-11-01: 10:00:00 1 [IU] via SUBCUTANEOUS
  Administered 2020-11-01: 12:00:00 2 [IU] via SUBCUTANEOUS
  Filled 2020-10-29 (×7): qty 1

## 2020-10-29 MED ORDER — ALBUTEROL SULFATE (2.5 MG/3ML) 0.083% IN NEBU: 2.5000 mg | INHALATION_SOLUTION | RESPIRATORY_TRACT | Status: DC | PRN

## 2020-10-29 MED ORDER — GABAPENTIN 100 MG PO CAPS
100.0000 mg | ORAL_CAPSULE | Freq: Every day | ORAL | Status: DC
  Administered 2020-10-30 – 2020-10-31 (×2): 100 mg via ORAL
  Filled 2020-10-29 (×2): qty 1

## 2020-10-29 MED ORDER — BENZONATATE 100 MG PO CAPS
100.0000 mg | ORAL_CAPSULE | Freq: Four times a day (QID) | ORAL | Status: DC | PRN
  Filled 2020-10-29: qty 1

## 2020-10-29 MED ORDER — CETIRIZINE HCL 10 MG PO TABS
10.0000 mg | ORAL_TABLET | Freq: Every evening | ORAL | Status: DC
  Administered 2020-10-30 – 2020-10-31 (×2): 10 mg via ORAL
  Filled 2020-10-29 (×3): qty 1

## 2020-10-29 MED ORDER — IPRATROPIUM-ALBUTEROL 0.5-2.5 (3) MG/3ML IN SOLN
3.0000 mL | Freq: Once | RESPIRATORY_TRACT | Status: AC
  Administered 2020-10-29: 3 mL via RESPIRATORY_TRACT
  Filled 2020-10-29: qty 6

## 2020-10-29 MED ORDER — ACETAMINOPHEN 325 MG PO TABS: 650.0000 mg | ORAL_TABLET | Freq: Four times a day (QID) | ORAL | Status: DC | PRN

## 2020-10-29 MED ORDER — ACETAMINOPHEN 650 MG RE SUPP: 650.0000 mg | Freq: Four times a day (QID) | RECTAL | Status: DC | PRN

## 2020-10-29 MED ORDER — PANTOPRAZOLE SODIUM 40 MG PO TBEC
40.0000 mg | DELAYED_RELEASE_TABLET | Freq: Every day | ORAL | Status: DC
  Administered 2020-10-30 – 2020-11-01 (×3): 40 mg via ORAL
  Filled 2020-10-29 (×3): qty 1

## 2020-10-29 MED ORDER — ASPIRIN EC 81 MG PO TBEC
81.0000 mg | DELAYED_RELEASE_TABLET | Freq: Every day | ORAL | Status: DC
  Administered 2020-10-30 – 2020-11-01 (×3): 81 mg via ORAL
  Filled 2020-10-29 (×3): qty 1

## 2020-10-29 MED ORDER — PRAVASTATIN SODIUM 20 MG PO TABS
20.0000 mg | ORAL_TABLET | Freq: Every day | ORAL | Status: DC
  Administered 2020-10-30 – 2020-10-31 (×2): 20 mg via ORAL
  Filled 2020-10-29 (×2): qty 1

## 2020-10-29 MED ORDER — HEPARIN SODIUM (PORCINE) 5000 UNIT/ML IJ SOLN
5000.0000 [IU] | Freq: Three times a day (TID) | INTRAMUSCULAR | Status: DC
  Administered 2020-10-29 – 2020-10-31 (×7): 5000 [IU] via SUBCUTANEOUS
  Filled 2020-10-29 (×7): qty 1

## 2020-10-29 MED ORDER — LACTATED RINGERS IV SOLN: INTRAVENOUS | Status: DC

## 2020-10-29 MED ORDER — METOPROLOL TARTRATE 50 MG PO TABS
50.0000 mg | ORAL_TABLET | Freq: Two times a day (BID) | ORAL | Status: DC
  Administered 2020-10-30 – 2020-11-01 (×5): 50 mg via ORAL
  Filled 2020-10-29 (×6): qty 1

## 2020-10-29 MED ORDER — HYDROXYZINE HCL 25 MG PO TABS
25.0000 mg | ORAL_TABLET | ORAL | Status: DC | PRN
  Filled 2020-10-29: qty 1

## 2020-10-29 NOTE — ED Notes (Signed)
Pt given coke 

## 2020-10-29 NOTE — ED Provider Notes (Signed)
[facility] Emergency Department Provider Note   ____________________________________________   First MD Initiated Contact with Patient 10/29/20 [phone]     (approximate)  I have reviewed the triage vital signs and the nursing notes.   HISTORY  Chief Complaint Shortness of Breath and Cough    HPI Misty Briggs is a 69 y.o. female patient has COPD asthma and long-haul Covid with shortness of breath.  She is gotten worse in the last week or so with a nonproductive cough.  Oxygen saturations have dropped in to the 93 range from as I understand about 96.  She does have a clear nasal discharge.  She also has right-sided CVA pain and her GFR which was normal in April and is now down to 25.  Anemia is gone from 0.77 to 2.13.   This is lower than it ever been in the past.      Past Medical History:  Diagnosis Date  . Arthritis   . Asthma   . COPD (chronic obstructive pulmonary disease) (HCC)    Dr. Meredeth Ide, [facility] pulmonologist  . Diabetes mellitus   . Fever of unknown origin 02/28/2020  . GERD (gastroesophageal reflux disease)   . Heart murmur   . HLD (hyperlipidemia)   . Hypertension   . Myocardial infarction (HCC)    mild  . Pneumonia due to COVID-19 virus 09/26/2019  . PONV (postoperative nausea and vomiting)    itching sometimes  . Rheumatoid arthritis(714.0)   . Shortness of breath     Patient Active Problem List   Diagnosis Date Noted  . Asthma-COPD overlap syndrome (HCC) 02/29/2020  . HLD (hyperlipidemia) 02/29/2020  . Arthritis, degenerative 09/04/2015  . Essential (primary) hypertension 07/01/2015  . Gastro-esophageal reflux disease without esophagitis 07/01/2015  . Combined fat and carbohydrate induced hyperlipemia 07/01/2015  . Rheumatoid arthritis involving multiple joints (HCC) 09/10/2014    Past Surgical History:  Procedure Laterality Date  . ANTERIOR CERVICAL DECOMP/DISCECTOMY FUSION  05/05/2012   Procedure:  ANTERIOR CERVICAL DECOMPRESSION/DISCECTOMY FUSION 1 LEVEL;  Surgeon: Karn Cassis, MD;  Location: [facility] NEURO ORS;  Service: Neurosurgery;  Laterality: N/A;  Cervical six seven Anterior cervical decompression/diskectomy, Fusion, Plate  . COLONOSCOPY WITH PROPOFOL N/A 09/08/2017   Procedure: COLONOSCOPY WITH PROPOFOL;  Surgeon: Christena Deem, MD;  Location: [facility] ENDOSCOPY;  Service: Endoscopy;  Laterality: N/A;  . ENDOMETRIAL ABLATION     novasure  . FOOT SURGERY  2015  . ROTATOR CUFF REPAIR     bilat  . TRANSTHORACIC ECHOCARDIOGRAM  2010   [facility]  . TUBAL LIGATION      Prior to Admission medications   Medication Sig Start Date End Date Taking? Authorizing Provider  acetaminophen (TYLENOL) 325 MG tablet Take 2 tablets (650 mg total) by mouth every 6 (six) hours as needed for mild pain or headache (fever >/= 101). 10/01/19   Elgergawy, Leana Roe, MD  albuterol (PROVENTIL) (2.5 MG/3ML) 0.083% nebulizer solution Take 3 mLs (2.5 mg total) by nebulization every 6 (six) hours. 03/20/20   Salena Saner, MD  albuterol (VENTOLIN HFA) 108 (90 Base) MCG/ACT inhaler Inhale 1-2 puffs into the lungs every 6 (six) hours as needed for wheezing or shortness of breath.    [provider]  amLODipine (NORVASC) 5 MG tablet Take 1 tablet (5 mg total) by mouth every evening. 03/07/20 04/22/20  Gillis Santa, MD  Ascorbic Acid (VITAMIN C) 1000 MG tablet Take 1,000 mg by mouth daily.  [provider]  aspirin EC 81 MG tablet Take by mouth daily.     [provider]  benzonatate (TESSALON) 100 MG capsule Take 1 capsule (100 mg total) by mouth every 6 (six) hours as needed for cough. 09/03/20   Luciano Cutter, MD  Budeson-Glycopyrrol-Formoterol (BREZTRI AEROSPHERE) 160-9-4.8 MCG/ACT AERO Inhale 2 puffs into the lungs in the morning and at bedtime. 03/26/20   Salena Saner, MD  Budeson-Glycopyrrol-Formoterol (BREZTRI AEROSPHERE) 160-9-4.8 MCG/ACT AERO Inhale 2 puffs  into the lungs in the morning and at bedtime. 07/24/20   Luciano Cutter, MD  calcium carbonate (OS-CAL - DOSED IN MG OF ELEMENTAL CALCIUM) 1250 (500 Ca) MG tablet Take 2 tablets by mouth daily with breakfast.    [provider]  Coenzyme Q10 (CO Q-10 PO) Take by mouth daily.    [provider]  ferrous sulfate 325 (65 FE) MG tablet Take 1 tablet (325 mg total) by mouth 2 (two) times daily with a meal. 11/19/18 04/22/20  Pyreddy, Vivien Rota, MD  fluticasone (FLONASE) 50 MCG/ACT nasal spray Place 1 spray into both nostrils daily as needed for allergies or rhinitis.    [provider]  furosemide (LASIX) 20 MG tablet Take 1 tablet by mouth daily. 03/17/20   [provider]  gabapentin (NEURONTIN) 100 MG capsule Take 1 capsule by mouth at bedtime. 04/09/20   [provider]  hydroxychloroquine (PLAQUENIL) 200 MG tablet Take by mouth. 01/03/20   [provider]  levocetirizine (XYZAL) 5 MG tablet Take 5 mg by mouth every evening.    [provider]  lovastatin (MEVACOR) 20 MG tablet Take 20 mg by mouth at bedtime.    [provider]  magnesium gluconate (MAGONATE) 500 MG tablet Take 500 mg by mouth daily.    [provider]  metFORMIN (GLUCOPHAGE) 1000 MG tablet Take 1 tablet (1,000 mg total) by mouth 2 (two) times daily with a meal. 11/19/18 04/22/20  Pyreddy, Vivien Rota, MD  metoprolol tartrate (LOPRESSOR) 25 MG tablet Take 1 tablet (25 mg total) by mouth 2 (two) times daily. 03/06/20 04/22/20  Gillis Santa, MD  Multiple Vitamin (MULTIVITAMIN) capsule Take 1 capsule by mouth daily.    [provider]  nystatin (MYCOSTATIN) 100000 UNIT/ML suspension Use as directed 5 mLs in the mouth or throat 4 (four) times daily. Twice daily    [provider]  omeprazole (PRILOSEC) 20 MG capsule Take 20 mg by mouth daily.    [provider]  potassium chloride (KLOR-CON) 10 MEQ tablet Take 10 mEq by mouth daily as needed. 02/04/20    [provider]  telmisartan (MICARDIS) 40 MG tablet Take 40 mg by mouth daily.    [provider]  vitamin B-12 (CYANOCOBALAMIN) 1000 MCG tablet Take 1,000 mcg by mouth daily.    [provider]    Allergies Doxycycline, Penicillins, Azithromycin, Levofloxacin, and Sulfa antibiotics  Family History  Problem Relation Age of Onset  . Breast cancer Paternal Aunt     Social History Social History   Tobacco Use  . Smoking status: Passive Smoke Exposure - Never Smoker  . Smokeless tobacco: Never Used  Vaping Use  . Vaping Use: Never used  Substance Use Topics  . Alcohol use: Not Currently    Alcohol/week: 0.0 standard drinks  . Drug use: No    Review of Systems  Constitutional: No fever/chills Eyes: No visual changes. ENT: No sore throat. Cardiovascular: Denies chest pain. Respiratory:  shortness of breath. Gastrointestinal: No  abdominal pain.  No nausea, no vomiting.  No diarrhea.  No constipation.  Patient does have right-sided CVA tenderness Genitourinary: Negative for dysuria. Musculoskeletal: Negative for back pain.  See gastrointestinal above Skin: Negative for rash. Neurological: Negative for headaches, focal weakness  ____________________________________________   PHYSICAL EXAM:  VITAL SIGNS: ED Triage Vitals [10/29/20 1230]  Enc Vitals Group     BP 113/67     Pulse Rate 92     Resp (!) 22     Temp 98.6 F (37 C)     Temp Source Oral     SpO2 100 %     Weight 165 lb (74.8 kg)     Height 5\' 6"  (1.676 m)     Head Circumference      Peak Flow      Pain Score 7     Pain Loc      Pain Edu?      Excl. in GC?     Constitutional: Alert and oriented. Well appearing and in no acute distress. Eyes: Conjunctivae are normal. PER Head: Atraumatic. Nose: No congestion/rhinnorhea. Mouth/Throat: Mucous membranes are moist.  Oropharynx non-erythematous. Neck: No stridor.  Cardiovascular: Normal rate, regular rhythm. Grossly normal  heart sounds.  Good peripheral circulation. Respiratory: Normal respiratory effort.  No retractions. Lungs coarse wheezes and rales Gastrointestinal: Soft and nontender. No distention. No abdominal bruits.  Right CVA tenderness. Musculoskeletal: No lower extremity tenderness nor edema.   Neurologic:  Normal speech and language. No gross focal neurologic deficits are appreciated.  Skin:  Skin is warm, dry and intact. No rash noted.   ____________________________________________   LABS (all labs ordered are listed, but only abnormal results are displayed)  Labs Reviewed  BASIC METABOLIC PANEL - Abnormal; Notable for the following components:      Result Value   Glucose, Bld 120 (*)    BUN 24 (*)    Creatinine, Ser 2.13 (*)    GFR, Estimated 25 (*)    All other components within normal limits  FIBRIN DERIVATIVES D-DIMER ([facility] ONLY) - Abnormal; Notable for the following components:   Fibrin derivatives D-dimer ([facility]) 1,073.90 (*)    All other components within normal limits  RESP PANEL BY RT-PCR (FLU A&B, COVID) ARPGX2  CBC  TROPONIN I (HIGH SENSITIVITY)   ____________________________________________  EKG  EKG read interpreted by me shows normal sinus rhythm rate of 94 normal axis first-degree AV block otherwise no acute changes ____________________________________________  RADIOLOGY Jill Poling, personally viewed and evaluated these images (plain radiographs) as part of my medical decision making, as well as reviewing the written report by the radiologist.  ED MD interpretation: Chest x-ray read by radiology reviewed by me shows no acute disease  Official radiology report(s): DG Chest 2 View  Result Date: 10/29/2020 CLINICAL DATA:  Shortness of breath EXAM: CHEST - 2 VIEW COMPARISON:  None. FINDINGS: The heart size and mediastinal contours are within normal limits. Mild chronic interstitial prominence. No new consolidation or edema. No pleural effusion or pneumothorax.  No acute osseous abnormality. IMPRESSION: No acute process in the chest. Electronically Signed   By: Guadlupe Spanish M.D.   On: 10/29/2020 14:00   CT Renal Stone Study  Result Date: 10/29/2020 CLINICAL DATA:  Right-sided pain EXAM: CT ABDOMEN AND PELVIS WITHOUT CONTRAST TECHNIQUE: Multidetector CT imaging of the abdomen and pelvis was performed following the standard protocol without IV contrast. COMPARISON:  CT 03/02/2020 FINDINGS: Lower chest: Lung bases demonstrate linear scarring within the  subpleural lower lobes. No acute consolidation or effusion. Normal cardiac size. Hepatobiliary: No focal liver abnormality is seen. No gallstones, gallbladder wall thickening, or biliary dilatation. Pancreas: Unremarkable. No pancreatic ductal dilatation or surrounding inflammatory changes. Spleen: Normal in size without focal abnormality. Adrenals/Urinary Tract: Adrenal glands are within normal limits. Kidneys show no hydronephrosis. The urinary bladder is unremarkable Stomach/Bowel: Stomach is nonenlarged. No dilated small bowel. No bowel wall thickening. Nonvisualized appendix but no right lower quadrant inflammatory process. Vascular/Lymphatic: Mild aortic atherosclerosis without aneurysm. No suspicious nodes Reproductive: Uterus and bilateral adnexa are unremarkable. Other: Negative for free air or free fluid. Musculoskeletal: Diffuse degenerative changes throughout the lumbar spine. No acute or suspicious osseous abnormality. IMPRESSION: No CT evidence for acute intra-abdominal or pelvic abnormality. Aortic Atherosclerosis (ICD10-I70.0). Electronically Signed   By: Jasmine Pang M.D.   On: 10/29/2020 17:19    ____________________________________________   PROCEDURES  Procedure(s) performed (including Critical Care):  Procedures   ____________________________________________   INITIAL IMPRESSION / ASSESSMENT AND PLAN / ED COURSE  Patient reports she often drops her GFR and has her creatinine go up  when she gets sick but after reviewing her labs back to 2010 she has never had a GFR below 40.  ----------------------------------------- 5:41 PM on 10/29/2020 -----------------------------------------  Patient feels like she is going to pass out.  Her blood pressure is low down in the 75/48 range.  We will let her back elevate her legs and give her some fluids and her pressure comes up about 105 systolic.  Patient still having a deep wet cough.  DuoNeb has not helped much.  Chest x-ray is clear.  D-dimer is elevated.  Unable to do CT scan of her lungs with contrast to rule out PE due to her acute kidney injury.  We will get her in the hospital.  We will try to use IV hydration to improve the AKI.  I have ordered a VQ scan in anticipation of doing this.  This may give [place] a reason for her right CVA area pain.  Could be due to a small pneumonia that has not blossomed due to dehydration or a PE or something else.  There does not appear to be any renal problems on the CT of the abdomen pelvis without contrast.  Her liver functions are pending.  Although she has no anterior abdominal pain.          ____________________________________________   FINAL CLINICAL IMPRESSION(S) / ED DIAGNOSES  Final diagnoses:  Dyspnea, unspecified type  COPD exacerbation (HCC)  Hypotension, unspecified hypotension type  AKI (acute kidney injury) [facility])     ED Discharge Orders    None      *Please note:  GRACYN ALLOR was evaluated in Emergency Department on 10/29/2020 for the symptoms described in the history of present illness. She was evaluated in the context of the global COVID-19 pandemic, which necessitated consideration that the patient might be at risk for infection with the SARS-CoV-2 virus that causes COVID-19. Institutional protocols and algorithms that pertain to the evaluation of patients at risk for COVID-19 are in a state of rapid change based on information released by regulatory bodies including  the CDC and federal and state organizations. These policies and algorithms were followed during the patient's care in the ED.  Some ED evaluations and interventions may be delayed as a result of limited staffing during and the pandemic.*   Note:  This document was prepared using Dragon voice recognition software and may include unintentional dictation  errors.    Arnaldo Natal, MD 10/29/20 1743

## 2020-10-29 NOTE — ED Notes (Signed)
Contacted Ouma, NP via secure chat to see if it is okay to complete V/Q scan in the morning. Awaiting reply. Awaiting further orders. Will continue to monitor.

## 2020-10-29 NOTE — H&P (Signed)
History and Physical    PLEASE NOTE THAT DRAGON DICTATION SOFTWARE WAS USED IN THE CONSTRUCTION OF THIS NOTE.   Misty Briggs ZHG:[id number] DOB: 1950-12-23 DOA: 10/29/2020  PCP: Jerl Mina, MD Patient coming from: home   I have personally briefly reviewed patient's old medical records in [facility]  Chief Complaint: Shortness of breath  HPI: Misty Briggs is a 69 y.o. female with medical history significant for hypertension, hyperlipidemia, type 2 diabetes mellitus, COPD, rheumatoid arthritis on Plaquenil, who is admitted to [facility] on 10/29/2020 with suspected acute COPD exacerbation after presenting from home to [facility] Emergency Department complaining of shortness of breath.   The patient reports 1 week of progressive shortness of breath associated with new onset nonproductive cough.  She denies any associated orthopnea, PND, or peripheral edema.  Denies any associated chest pain, diaphoresis, palpitations.  Reports slight decline in recent appetite, resulting in diminished oral intake over that time.  Denies any vomiting denies any recent lower extremity erythema, calf tenderness, or hemoptysis.  Denies any recent trauma, travel, periods of diminished ambulatory activity, underlying latency, or personal history of DVT/PE.  Denies any associated subjective fever, chills, rigors, or generalized myalgias.  Denies any recent headache, neck stiffness, sore throat, wheezing, abdominal pain, diarrhea, rash.  No known COVID-19 exposures.  She confirms a history of COPD, reports good compliance with her outpatient respiratory regimen, which includes scheduled Breztri and as needed albuterol.  She also confirms that she is not a current smoker.  Past medical history is also notable for history of coronary artery disease status post MI, with most recent echocardiogram, which was performed in March 2021, showing evidence of left ventricular EF of 55 to 60%, no evidence of  focal wall motion normalities, indeterminate diastolic function, as well as mildly elevated pulmonary artery systolic pressures, in the absence of any significant valvular pathology.    ED Course:  Vital signs in the ED were notable for the following respiratory rate 16-22 temperature max 98.6; heart rate 85-93; initial blood pressure noted to be 75/48, which increased to 130/62 following interval IV fluids, as further quantified below ; respiratory rate 16-22; oxygen saturation 96 to 100% on room air.  Labs were notable for the following: CMP was notable for the following: Sodium 130, potassium 4.2, bicarbonate 25, BUN 24, creatinine 2.13 which is relative to baseline range of 0.81.2, with most recent prior value of 0.79 when checked on 03/05/2020.  High-sensitivity troponin I x2 were found to be 5 on both occasions.  CBC notable for liver cell count of 6700 with hemoglobin 12.9.  COVID-19/influenza PCR performed in the ED as he was found to be negative.  2 view chest x-ray showed no evidence of acute cardiopulmonary process, including no evidence of infiltrate, edema, effusion, or pneumothorax.  EKG shows sinus rhythm with first degree AV block, rate 94, QTc 495, and no evidence of T wave or ST changes, including no evidence of ST elevation.  While in the ED, the following were administered: Normal saline x2 L bolus, following which blood pressure increased, as described above; duo nebulizer treatment x1.  Subsequently, the patient was admitted to the med telemetry floor for further evaluation and management of suspected presenting acute COPD exacerbation as well as acute kidney injury.     Review of Systems: As per HPI otherwise 10 point review of systems negative.   Past Medical History:  Diagnosis Date  . Arthritis   . Asthma   .  COPD (chronic obstructive pulmonary disease) (HCC)    Dr. Meredeth Ide, [facility] pulmonologist  . Diabetes mellitus   . Fever of unknown origin  02/28/2020  . GERD (gastroesophageal reflux disease)   . Heart murmur   . HLD (hyperlipidemia)   . Hypertension   . Myocardial infarction (HCC)    mild  . Pneumonia due to COVID-19 virus 09/26/2019  . PONV (postoperative nausea and vomiting)    itching sometimes  . Rheumatoid arthritis(714.0)   . Shortness of breath     Past Surgical History:  Procedure Laterality Date  . ANTERIOR CERVICAL DECOMP/DISCECTOMY FUSION  05/05/2012   Procedure: ANTERIOR CERVICAL DECOMPRESSION/DISCECTOMY FUSION 1 LEVEL;  Surgeon: Karn Cassis, MD;  Location: [facility] NEURO ORS;  Service: Neurosurgery;  Laterality: N/A;  Cervical six seven Anterior cervical decompression/diskectomy, Fusion, Plate  . COLONOSCOPY WITH PROPOFOL N/A 09/08/2017   Procedure: COLONOSCOPY WITH PROPOFOL;  Surgeon: Christena Deem, MD;  Location: [facility] ENDOSCOPY;  Service: Endoscopy;  Laterality: N/A;  . ENDOMETRIAL ABLATION     novasure  . FOOT SURGERY  2015  . ROTATOR CUFF REPAIR     bilat  . TRANSTHORACIC ECHOCARDIOGRAM  2010   [facility]  . TUBAL LIGATION      Social History:  reports that she is a non-smoker but has been exposed to tobacco smoke. She has never used smokeless tobacco. She reports previous alcohol use. She reports that she does not use drugs.   Allergies  Allergen Reactions  . Doxycycline Other (See Comments)    Thrush  . Penicillins Hives, Palpitations and Rash  . Azithromycin Other (See Comments)    Thrush  . Levofloxacin Other (See Comments)    Joint pain/muscle pain  . Sulfa Antibiotics Rash    Family History  Problem Relation Age of Onset  . Breast cancer Paternal Aunt     Prior to Admission medications   Medication Sig Start Date End Date Taking? Authorizing Provider  acetaminophen (TYLENOL) 325 MG tablet Take 2 tablets (650 mg total) by mouth every 6 (six) hours as needed for mild pain or headache (fever >/= 101). 10/01/19   Elgergawy, Leana Roe, MD  albuterol (PROVENTIL) (2.5  MG/3ML) 0.083% nebulizer solution Take 3 mLs (2.5 mg total) by nebulization every 6 (six) hours. 03/20/20   Salena Saner, MD  albuterol (VENTOLIN HFA) 108 (90 Base) MCG/ACT inhaler Inhale 1-2 puffs into the lungs every 6 (six) hours as needed for wheezing or shortness of breath.    [provider]  amLODipine (NORVASC) 5 MG tablet Take 1 tablet (5 mg total) by mouth every evening. 03/07/20 04/22/20  Gillis Santa, MD  Ascorbic Acid (VITAMIN C) 1000 MG tablet Take 1,000 mg by mouth daily.    [provider]  aspirin EC 81 MG tablet Take by mouth daily.     [provider]  benzonatate (TESSALON) 100 MG capsule Take 1 capsule (100 mg total) by mouth every 6 (six) hours as needed for cough. 09/03/20   Luciano Cutter, MD  Budeson-Glycopyrrol-Formoterol (BREZTRI AEROSPHERE) 160-9-4.8 MCG/ACT AERO Inhale 2 puffs into the lungs in the morning and at bedtime. 03/26/20   Salena Saner, MD  Budeson-Glycopyrrol-Formoterol (BREZTRI AEROSPHERE) 160-9-4.8 MCG/ACT AERO Inhale 2 puffs into the lungs in the morning and at bedtime. 07/24/20   Luciano Cutter, MD  calcium carbonate (OS-CAL - DOSED IN MG OF ELEMENTAL CALCIUM) 1250 (500 Ca) MG tablet Take 2 tablets by mouth daily with breakfast.  [provider]  Coenzyme Q10 (CO Q-10 PO) Take by mouth daily.    [provider]  ferrous sulfate 325 (65 FE) MG tablet Take 1 tablet (325 mg total) by mouth 2 (two) times daily with a meal. 11/19/18 04/22/20  Pyreddy, Vivien Rota, MD  fluticasone (FLONASE) 50 MCG/ACT nasal spray Place 1 spray into both nostrils daily as needed for allergies or rhinitis.    [provider]  furosemide (LASIX) 20 MG tablet Take 1 tablet by mouth daily. 03/17/20   [provider]  gabapentin (NEURONTIN) 100 MG capsule Take 1 capsule by mouth at bedtime. 04/09/20   [provider]  hydroxychloroquine (PLAQUENIL) 200 MG tablet Take by mouth. 01/03/20   [provider]   levocetirizine (XYZAL) 5 MG tablet Take 5 mg by mouth every evening.    [provider]  lovastatin (MEVACOR) 20 MG tablet Take 20 mg by mouth at bedtime.    [provider]  magnesium gluconate (MAGONATE) 500 MG tablet Take 500 mg by mouth daily.    [provider]  metFORMIN (GLUCOPHAGE) 1000 MG tablet Take 1 tablet (1,000 mg total) by mouth 2 (two) times daily with a meal. 11/19/18 04/22/20  Pyreddy, Vivien Rota, MD  metoprolol tartrate (LOPRESSOR) 25 MG tablet Take 1 tablet (25 mg total) by mouth 2 (two) times daily. 03/06/20 04/22/20  Gillis Santa, MD  Multiple Vitamin (MULTIVITAMIN) capsule Take 1 capsule by mouth daily.    [provider]  nystatin (MYCOSTATIN) 100000 UNIT/ML suspension Use as directed 5 mLs in the mouth or throat 4 (four) times daily. Twice daily    [provider]  omeprazole (PRILOSEC) 20 MG capsule Take 20 mg by mouth daily.    [provider]  potassium chloride (KLOR-CON) 10 MEQ tablet Take 10 mEq by mouth daily as needed. 02/04/20   [provider]  telmisartan (MICARDIS) 40 MG tablet Take 40 mg by mouth daily.    [provider]  vitamin B-12 (CYANOCOBALAMIN) 1000 MCG tablet Take 1,000 mcg by mouth daily.    [provider]     Objective    Physical Exam: Vitals:   10/29/20 1230 10/29/20 1506 10/29/20 1704 10/29/20 1723  BP: 113/67 (!) 111/53 (!) 75/48 93/70  Pulse: 92 78 65 76  Resp: (!) 22 (!) 22 13 15   Temp: 98.6 F (37 C)     TempSrc: Oral     SpO2: 100% 98% 100% 98%  Weight: 74.8 kg     Height: 5\' 6"  (1.676 m)       General: appears to be stated age; alert, oriented; mild increased work of breathing noted Skin: warm, dry, no rash Head:  AT/[place] Mouth:  Oral mucosa membranes appear moist, normal dentition Neck: supple; trachea midline Heart:  RRR; did not appreciate any M/R/G Lungs: CTAB, did not appreciate any wheezes, rales, or rhonchi Abdomen: + BS; soft, ND,  NT Vascular: 2+ pedal pulses b/l; 2+ radial pulses b/l Extremities: no peripheral edema, no muscle wasting Neuro: strength and sensation intact in upper and lower extremities b/l    Labs on Admission: I have personally reviewed following labs and imaging studies  CBC: Recent Labs  Lab 10/29/20 1232  WBC 6.7  HGB 12.9  HCT 39.4  MCV 87.0  PLT 280   Basic Metabolic Panel: Recent Labs  Lab 10/29/20 1232  NA 138  K 4.2  CL 99  CO2 25  GLUCOSE 120*  BUN 24*  CREATININE 2.13*  CALCIUM 9.2  GFR: Estimated Creatinine Clearance: 26.1 mL/min (A) (by C-G formula based on SCr of 2.13 mg/dL (H)). Liver Function Tests: No results for input(s): AST, ALT, ALKPHOS, BILITOT, PROT, ALBUMIN in the last 168 hours. No results for input(s): LIPASE, AMYLASE in the last 168 hours. No results for input(s): AMMONIA in the last 168 hours. Coagulation Profile: No results for input(s): INR, PROTIME in the last 168 hours. Cardiac Enzymes: No results for input(s): CKTOTAL, CKMB, CKMBINDEX, TROPONINI in the last 168 hours. BNP (last 3 results) No results for input(s): PROBNP in the last 8760 hours. HbA1C: No results for input(s): HGBA1C in the last 72 hours. CBG: No results for input(s): GLUCAP in the last 168 hours. Lipid Profile: No results for input(s): CHOL, HDL, LDLCALC, TRIG, CHOLHDL, LDLDIRECT in the last 72 hours. Thyroid Function Tests: No results for input(s): TSH, T4TOTAL, FREET4, T3FREE, THYROIDAB in the last 72 hours. Anemia Panel: No results for input(s): VITAMINB12, FOLATE, FERRITIN, TIBC, IRON, RETICCTPCT in the last 72 hours. Urine analysis:    Component Value Date/Time   COLORURINE YELLOW (A) 02/28/2020 1923   APPEARANCEUR CLEAR (A) 02/28/2020 1923   LABSPEC 1.016 02/28/2020 1923   PHURINE 5.0 02/28/2020 1923   GLUCOSEU NEGATIVE 02/28/2020 1923   HGBUR NEGATIVE 02/28/2020 1923   BILIRUBINUR NEGATIVE 02/28/2020 1923   KETONESUR 5 (A) 02/28/2020 1923   PROTEINUR 100  (A) 02/28/2020 1923   NITRITE NEGATIVE 02/28/2020 1923   LEUKOCYTESUR SMALL (A) 02/28/2020 1923    Radiological Exams on Admission: DG Chest 2 View  Result Date: 10/29/2020 CLINICAL DATA:  Shortness of breath EXAM: CHEST - 2 VIEW COMPARISON:  None. FINDINGS: The heart size and mediastinal contours are within normal limits. Mild chronic interstitial prominence. No new consolidation or edema. No pleural effusion or pneumothorax. No acute osseous abnormality. IMPRESSION: No acute process in the chest. Electronically Signed   By: Guadlupe Spanish M.D.   On: 10/29/2020 14:00   CT Renal Stone Study  Result Date: 10/29/2020 CLINICAL DATA:  Right-sided pain EXAM: CT ABDOMEN AND PELVIS WITHOUT CONTRAST TECHNIQUE: Multidetector CT imaging of the abdomen and pelvis was performed following the standard protocol without IV contrast. COMPARISON:  CT 03/02/2020 FINDINGS: Lower chest: Lung bases demonstrate linear scarring within the subpleural lower lobes. No acute consolidation or effusion. Normal cardiac size. Hepatobiliary: No focal liver abnormality is seen. No gallstones, gallbladder wall thickening, or biliary dilatation. Pancreas: Unremarkable. No pancreatic ductal dilatation or surrounding inflammatory changes. Spleen: Normal in size without focal abnormality. Adrenals/Urinary Tract: Adrenal glands are within normal limits. Kidneys show no hydronephrosis. The urinary bladder is unremarkable Stomach/Bowel: Stomach is nonenlarged. No dilated small bowel. No bowel wall thickening. Nonvisualized appendix but no right lower quadrant inflammatory process. Vascular/Lymphatic: Mild aortic atherosclerosis without aneurysm. No suspicious nodes Reproductive: Uterus and bilateral adnexa are unremarkable. Other: Negative for free air or free fluid. Musculoskeletal: Diffuse degenerative changes throughout the lumbar spine. No acute or suspicious osseous abnormality. IMPRESSION: No CT evidence for acute intra-abdominal or  pelvic abnormality. Aortic Atherosclerosis (ICD10-I70.0). Electronically Signed   By: Jasmine Pang M.D.   On: 10/29/2020 17:19     EKG: Independently reviewed, with result as described above.    Assessment/Plan   CLELLA MCKEEL is a 69 y.o. female with medical history significant for hypertension, hyperlipidemia, type 2 diabetes mellitus, COPD, rheumatoid arthritis on Plaquenil, who is admitted to [facility] on 10/29/2020 with suspected acute COPD exacerbation after presenting from home to [facility] Emergency Department complaining of shortness of  breath.     Principal Problem:   Acute exacerbation of chronic obstructive pulmonary disease (COPD) (HCC) Active Problems:   Gastro-esophageal reflux disease without esophagitis   Rheumatoid arthritis involving multiple joints (HCC)   HLD (hyperlipidemia)   AKI (acute kidney injury) (HCC)   Hypotension    #) Acute COPD exacerbation: In the context of a documented history of COPD, the patient presents with 1 week of progressive shortness of breath associated with new nonproductive cough, and evidence of increased work of breathing on physical exam, with presenting chest x-ray showing evidence of acute cardiopulmonary process.  Etiology leading to this acute exacerbation is not completely clear given the negative radiographic findings as well as the patient's reported outstanding compliance with her home respiratory regimen.  The differential does include acute pulmonary embolism, although presentation is not associate with any significant risk factors.  Presenting COVID-19/influenza PCR found to be negative.  ACS is felt to be less likely in the absence of any chest discomfort, while high-sensitivity troponin Noele Icenhour found to be negative, while EKG shows no evidence of acute ischemic changes.  In order to further work-up the possibility of acute pulmonary embolism and the current limitation of the patient's presenting AKI with  associated creatinine 2.13, the emergency room physician ordered a VQ scan.  work-up and management of presenting AKI, including interval IV fluids, with potential to pursue CTA of the chest.  Renal function subsequently improved, noting that baseline creatinine is associate with a range of 0.8-1.2.  Presentation is not associate with any hypoxia.  Plan: Scheduled duo nebulizer treatments every 6 hours following.  As needed albuterol nebulizer.  Solu-Medrol 40 mg IV every 6 hours.  Given the patient's stents in allergy list to various classes of antibiotics, will refrain from the typical initiation of antibiotics in the setting of COPD exacerbation for the benefit of diminished duration of hospitalization.  Monitor on continuous pulse oximetry.  Monitor telemetry.  Add on procalcitonin.  Check serum phosphorus level.  Check VBG.    #) Acute kidney injury: Presenting creatinine noted to be 2.13 relative to baseline range of 0.8-1.2, the most recent prior serum creatinine data point noted to be 0.79 on 03/05/2020.  Suspect prerenal etiology in the context of intravascular depletion due to recent decline in oral intake versus diminished renal perfusion given initial hypotensive blood pressures there may also be a pharmacologic exacerbating component given the presence of ARB as outpatient medication.  Will provide additional IV fluids, and expand work-up, as further described below.  Plan: Lactated Ringer's at 100 cc/h.  Check urinalysis with close attention to associated microscopic evaluation.  Check random urine sodium as well as random urine creatinine.  Check CPK.  Monitor strict I's and O's and daily weights.  Attempt avoid nephrotoxic agents.  Hold home ARB.  Repeat BMP in the morning.      #) Hypotension: Initial systolic blood pressure in the 70s, which is improved to normotensive values following interval IV fluids.  Suspect contribution from dehydration given recent decline in oral intake, as  further described above.  This is in the context of a known history of hypertension for which the patient is on multiple antihypertensive meds at home, which include Norvasc, Lopressor, and Telsartan.  Clinically, no evidence to suggest underlying infectious process at this time, but will monitor closely for result of ensuing urinalysis.  While obstructive source such as acute pulmonary embolism is a possibility, is clinically felt to be less likely at this time.  No evidence of acute blood loss.  Cardiogenic source also appears less likely, as further described above.  Plan: Continuous lactated Ringer's, as above.  Monitor strict I's and O's and daily weights.  Close monitoring of ensuing blood pressure via routine vital signs.  Monitor on telemetry.  Check urinalysis.  Hold home antihypertensive medications for now.    #) Hyperlipidemia: On lovastatin as an outpatient.  Plan: Continue home statin.     #) GERD: On omeprazole as an outpatient.  Plan: Continue home PPI.       #) Type 2 diabetes mellitus: On Metformin as well patient oral hypoglycemic agent.  Not on any exogenous insulin at home.  Presenting blood sugar per CMP noted to be 120.   Plan: We will hold Metformin for now, particularly the setting of presenting creatinine of 2.13 prevent risk for associated type B lactic acidosis.  Accu-Cheks before every meal and at bedtime with associated low-dose sliding scale insulin.  Will attempt additional chart review to evaluate for any recent hemoglobin A1c values.     #) Rheumatoid arthritis: Chronically immunocompromised on  Plaquenil.   Plan: Continue home Plaquenil.       DVT prophylaxis: Heparin 5000 units subcu 3 times daily Code Status: Full code Family Communication: none Disposition Plan: Per Rounding Team Consults called: none  Admission status: Inpatient; med telemetry.   Of note, this patient was added by me to the following Admit List/Treatment Team:   armcadmits     PLEASE NOTE THAT DRAGON DICTATION SOFTWARE WAS USED IN THE CONSTRUCTION OF THIS NOTE.   Angie Fava DO [facility] Pager [phone] From 12PM- 12AM  Otherwise, please contact night-coverage  [email or web address] Password [facility]  10/29/2020, 5:57 PM

## 2020-10-29 NOTE — ED Notes (Signed)
ED TO INPATIENT HANDOFF REPORT  ED Nurse Name and Phone #: [phone]  S Name/Age/Gender Misty Briggs 69 y.o. female Room/Bed: ED38A/ED38A  Code Status   Code Status: Full Code  Home/SNF/Other Home Patient oriented to: self, place, time and situation Is this baseline? Yes   Triage Complete: Triage complete  Chief Complaint Acute exacerbation of chronic obstructive pulmonary disease (COPD) (HCC) [J44.1]  Triage Note Pt comes into the ED via POV c/o cough, SHOB, and right side upper back pain.  Pt states she has COPD, asthma, and long term side effects from COVID.  Pt attempted to call her pulmonologist with no luck.  Pt states he O2 levels at home have dropped down to 93% RA.  Pt has O2 for PRN use. Pt has even and unlabored respirations at this time.      Allergies Allergies  Allergen Reactions  . Doxycycline Other (See Comments)    Thrush  . Penicillins Hives, Palpitations and Rash  . Azithromycin Other (See Comments)    Thrush  . Levofloxacin Other (See Comments)    Joint pain/muscle pain  . Sulfa Antibiotics Rash    Level of Care/Admitting Diagnosis ED Disposition    ED Disposition Condition Comment   Admit  Hospital Area: [facility] [[id number]]  Level of Care: Med-Surg [16]  Covid Evaluation: Confirmed COVID Negative  Diagnosis: Acute exacerbation of chronic obstructive pulmonary disease (COPD) [facility]) [[id number]]  Admitting Physician: Angie Fava [[id number]]  Attending Physician: Angie Fava [[id number]]  Estimated length of stay: past midnight tomorrow  Certification:: I certify this patient will need inpatient services for at least 2 midnights       B Medical/Surgery History Past Medical History:  Diagnosis Date  . Arthritis   . Asthma   . COPD (chronic obstructive pulmonary disease) (HCC)    Dr. Meredeth Ide, [facility] pulmonologist  . Diabetes mellitus   . Fever of unknown origin 02/28/2020  . GERD  (gastroesophageal reflux disease)   . Heart murmur   . HLD (hyperlipidemia)   . Hypertension   . Myocardial infarction (HCC)    mild  . Pneumonia due to COVID-19 virus 09/26/2019  . PONV (postoperative nausea and vomiting)    itching sometimes  . Rheumatoid arthritis(714.0)   . Shortness of breath    Past Surgical History:  Procedure Laterality Date  . ANTERIOR CERVICAL DECOMP/DISCECTOMY FUSION  05/05/2012   Procedure: ANTERIOR CERVICAL DECOMPRESSION/DISCECTOMY FUSION 1 LEVEL;  Surgeon: Karn Cassis, MD;  Location: [facility] NEURO ORS;  Service: Neurosurgery;  Laterality: N/A;  Cervical six seven Anterior cervical decompression/diskectomy, Fusion, Plate  . COLONOSCOPY WITH PROPOFOL N/A 09/08/2017   Procedure: COLONOSCOPY WITH PROPOFOL;  Surgeon: Christena Deem, MD;  [facility] END[facility]COPY;  Service: Endoscopy;  Laterality: N/A;  . ENDOMETRIAL ABLATION     novasure  . FOOT SURGERY  2015  . ROTATOR CUFF REPAIR     bilat  . TRANSTHORACIC ECHOCARDIOGRAM  2010   [facility]  . TUBAL LIGATION       A IV Location/Drains/Wounds Patient Lines/Drains/Airways Status    Active Line/Drains/Airways    Name Placement date Placement time Site Days   Peripheral IV 10/29/20 Left Antecubital 10/29/20  1700  Antecubital  less than 1   Peripheral IV 10/29/20 Right Hand 10/29/20  1716  Hand  less than 1   Incision 05/05/12 Neck 05/05/12  1516   3099          Intake/Output Last 24 hours  Intake/Output Summary (Last 24 hours) at 10/29/2020 2329 Last data filed at 10/29/2020 2141 Gross per 24 hour  Intake 2000 ml  Output --  Net 2000 ml    Labs/Imaging Results for orders placed or performed during the hospital encounter of 10/29/20 (from the past 48 hour(s))  Basic metabolic panel     Status: Abnormal   Collection Time: 10/29/20 12:32 PM  Result Value Ref Range   Sodium 138 135 - 145 mmol/L   Potassium 4.2 3.5 - 5.1 mmol/L   Chloride 99 98 - 111 mmol/L   CO2 25 22 - 32  mmol/L   Glucose, Bld 120 (H) 70 - 99 mg/dL    Comment: Glucose reference range applies only to samples taken after fasting for at least 8 hours.   BUN 24 (H) 8 - 23 mg/dL   Creatinine, Ser 4.25 (H) 0.44 - 1.00 mg/dL   Calcium 9.2 8.9 - 95.6 mg/dL   GFR, Estimated 25 (L) >60 mL/min    Comment: (NOTE) Calculated using the CKD-EPI Creatinine Equation (2021)    Anion gap 14 5 - 15    Comment: Performed at [facility], [address] [place], [place] [address]  CBC     Status: None   Collection Time: 10/29/20 12:32 PM  Result Value Ref Range   WBC 6.7 4.0 - 10.5 K/uL   RBC 4.53 3.87 - 5.11 MIL/uL   Hemoglobin 12.9 12.0 - 15.0 g/dL   HCT 43.3 36 - 46 %   MCV 87.0 80.0 - 100.0 fL   MCH 28.5 26.0 - 34.0 pg   MCHC 32.7 30.0 - 36.0 g/dL   RDW 29.5 18.8 - 41.6 %   Platelets 280 150 - 400 K/uL   nRBC 0.0 0.0 - 0.2 %    Comment: Performed at [facility], [address], [place], [place] [address]  Troponin I (High Sensitivity)     Status: None   Collection Time: 10/29/20 12:32 PM  Result Value Ref Range   Troponin I (High Sensitivity) 5 <18 ng/L    Comment: (NOTE) Elevated high sensitivity troponin I (hsTnI) values and significant  changes across serial measurements may suggest ACS but many other  chronic and acute conditions are known to elevate hsTnI results.  Refer to the "Links" section for chest pain algorithms and additional  guidance. Performed at [facility], [address] [place], [place] [address]   Hepatic function panel     Status: None   Collection Time: 10/29/20 12:32 PM  Result Value Ref Range   Total Protein 7.4 6.5 - 8.1 g/dL   Albumin 3.9 3.5 - 5.0 g/dL   AST 30 15 - 41 U/L   ALT 19 0 - 44 U/L   Alkaline Phosphatase 97 38 - 126 U/L   Total Bilirubin 0.7 0.3 - 1.2 mg/dL   Bilirubin, Direct <9.3 0.0 - 0.2 mg/dL   Indirect Bilirubin NOT CALCULATED 0.3 - 0.9 mg/dL    Comment: Performed at [facility], [address] [place], [place] [address]  Fibrin derivatives D-Dimer     Status: Abnormal   Collection Time: 10/29/20  4:53 PM  Result Value Ref Range   Fibrin derivatives D-dimer ([facility]) 1,073.90 (H) 0.00 - 499.00 ng/mL (FEU)    Comment: (NOTE) <> Exclusion of Venous Thromboembolism (VTE) - OUTPATIENT ONLY   (Emergency Department or [place])    0-499 ng/ml (FEU): With a low to intermediate pretest probability  for VTE this test result excludes the diagnosis                      of VTE.   >499 ng/ml (FEU) : VTE not excluded; additional work up for VTE is                      required.  <> Testing on Inpatients and Evaluation of Disseminated Intravascular   Coagulation (DIC) Reference Range:   0-499 ng/ml (FEU) Performed at [facility], [address] [place], [place] [address]   Resp Panel by RT-PCR (Flu A&B, Covid) Nasopharyngeal Swab     Status: None   Collection Time: 10/29/20  4:53 PM   Specimen: Nasopharyngeal Swab; Nasopharyngeal(NP) swabs in vial transport medium  Result Value Ref Range   SARS Coronavirus 2 by RT PCR NEGATIVE NEGATIVE    Comment: (NOTE) SARS-CoV-2 target nucleic acids are NOT DETECTED.  The SARS-CoV-2 RNA is generally detectable in upper respiratory specimens during the acute phase of infection. The lowest concentration of SARS-CoV-2 viral copies this assay can detect is 138 copies/mL. A negative result does not preclude SARS-Cov-2 infection and should not be used as the sole basis for treatment or other patient management decisions. A negative result may occur with  improper specimen collection/handling, submission of specimen other than nasopharyngeal swab, presence of viral mutation(s) within the areas targeted by this assay, and inadequate number of viral copies(<138 copies/mL). A negative result must be combined with clinical observations, patient history, and epidemiological information. The expected result is Negative.  Fact Sheet  for Patients:  BloggerCourse.com  Fact Sheet for Healthcare Providers:  SeriousBroker.it  This test is no t yet approved or cleared by the [place] FDA and  has been authorized for detection and/or diagnosis of SARS-CoV-2 by FDA under an Emergency Use Authorization (EUA). This EUA will remain  in effect (meaning this test can be used) for the duration of the COVID-19 declaration under Section 564(b)(1) of the Act, 21 U.S.C.section 360bbb-3(b)(1), unless the authorization is terminated  or revoked sooner.       Influenza A by PCR NEGATIVE NEGATIVE   Influenza B by PCR NEGATIVE NEGATIVE    Comment: (NOTE) The Xpert Xpress SARS-CoV-2/FLU/RSV plus assay is intended as an aid in the diagnosis of influenza from Nasopharyngeal swab specimens and should not be used as a sole basis for treatment. Nasal washings and aspirates are unacceptable for Xpert Xpress SARS-CoV-2/FLU/RSV testing.  Fact Sheet for Patients: BloggerCourse.com  Fact Sheet for Healthcare Providers: SeriousBroker.it  This test is not yet approved or cleared by the [place] FDA and has been authorized for detection and/or diagnosis of SARS-CoV-2 by FDA under an Emergency Use Authorization (EUA). This EUA will remain in effect (meaning this test can be used) for the duration of the COVID-19 declaration under Section 564(b)(1) of the Act, 21 U.S.C. section 360bbb-3(b)(1), unless the authorization is terminated or revoked.  Performed at [facility], [address] [place], [place] [address]   Troponin I (High Sensitivity)     Status: None   Collection Time: 10/29/20  6:11 PM  Result Value Ref Range   Troponin I (High Sensitivity) 5 <18 ng/L    Comment: (NOTE) Elevated high sensitivity troponin I (hsTnI) values and significant  changes across serial measurements may suggest ACS but many other   chronic and acute conditions are known to elevate hsTnI results.  Refer to the "Links" section for  chest pain algorithms and additional  guidance. Performed at [facility], [address], [place], [place] [address]   Magnesium     Status: None   Collection Time: 10/29/20  6:11 PM  Result Value Ref Range   Magnesium 1.9 1.7 - 2.4 mg/dL    Comment: Performed at [facility], [address] [place], [place] [address]  CK     Status: None   Collection Time: 10/29/20  8:14 PM  Result Value Ref Range   Total CK 79 38.0 - 234.0 U/L    Comment: Performed at [facility], [address] [place], [place] [address]  Urinalysis, Complete w Microscopic     Status: Abnormal   Collection Time: 10/29/20  9:09 PM  Result Value Ref Range   Color, Urine STRAW (A) YELLOW   APPearance CLEAR (A) CLEAR   Specific Gravity, Urine 1.006 1.005 - 1.030   pH 5.0 5.0 - 8.0   Glucose, UA NEGATIVE NEGATIVE mg/dL   Hgb urine dipstick NEGATIVE NEGATIVE   Bilirubin Urine NEGATIVE NEGATIVE   Ketones, ur NEGATIVE NEGATIVE mg/dL   Protein, ur NEGATIVE NEGATIVE mg/dL   Nitrite NEGATIVE NEGATIVE   Leukocytes,Ua NEGATIVE NEGATIVE   WBC, UA 0-5 0 - 5 WBC/hpf   Bacteria, UA RARE (A) NONE SEEN   Squamous Epithelial / LPF 0-5 0 - 5   Mucus PRESENT    Hyaline Casts, UA PRESENT     Comment: Performed at [facility], [address] [place], [place] [address]  Sodium, urine, random     Status: None   Collection Time: 10/29/20  9:09 PM  Result Value Ref Range   Sodium, Ur 51 mmol/L    Comment: Performed at [facility], [address] [place], [place] [address]  Creatinine, urine, random     Status: None   Collection Time: 10/29/20  9:09 PM  Result Value Ref Range   Creatinine, Urine 54 mg/dL    Comment: Performed at [facility], [address] [place], [place] [address]  CBG monitoring, ED     Status: None   Collection Time: 10/29/20  9:51 PM   Result Value Ref Range   Glucose-Capillary 75 70 - 99 mg/dL    Comment: Glucose reference range applies only to samples taken after fasting for at least 8 hours.   DG Chest 2 View  Result Date: 10/29/2020 CLINICAL DATA:  Shortness of breath EXAM: CHEST - 2 VIEW COMPARISON:  None. FINDINGS: The heart size and mediastinal contours are within normal limits. Mild chronic interstitial prominence. No new consolidation or edema. No pleural effusion or pneumothorax. No acute osseous abnormality. IMPRESSION: No acute process in the chest. Electronically Signed   By: Guadlupe Spanish M.D.   On: 10/29/2020 14:00   CT Renal Stone Study  Result Date: 10/29/2020 CLINICAL DATA:  Right-sided pain EXAM: CT ABDOMEN AND PELVIS WITHOUT CONTRAST TECHNIQUE: Multidetector CT imaging of the abdomen and pelvis was performed following the standard protocol without IV contrast. COMPARISON:  CT 03/02/2020 FINDINGS: Lower chest: Lung bases demonstrate linear scarring within the subpleural lower lobes. No acute consolidation or effusion. Normal cardiac size. Hepatobiliary: No focal liver abnormality is seen. No gallstones, gallbladder wall thickening, or biliary dilatation. Pancreas: Unremarkable. No pancreatic ductal dilatation or surrounding inflammatory changes. Spleen: Normal in size without focal abnormality. Adrenals/Urinary Tract: Adrenal glands are within normal limits. Kidneys show no hydronephrosis. The urinary bladder is unremarkable Stomach/Bowel: Stomach is nonenlarged. No dilated small bowel.  No bowel wall thickening. Nonvisualized appendix but no right lower quadrant inflammatory process. Vascular/Lymphatic: Mild aortic atherosclerosis without aneurysm. No suspicious nodes Reproductive: Uterus and bilateral adnexa are unremarkable. Other: Negative for free air or free fluid. Musculoskeletal: Diffuse degenerative changes throughout the lumbar spine. No acute or suspicious osseous abnormality. IMPRESSION: No CT evidence  for acute intra-abdominal or pelvic abnormality. Aortic Atherosclerosis (ICD10-I70.0). Electronically Signed   By: Jasmine Pang M.D.   On: 10/29/2020 17:19    Pending Labs Unresulted Labs (From admission, onward)          Start     Ordered   10/30/20 0500  Phosphorus  Tomorrow morning,   STAT        10/29/20 2013   10/30/20 0500  Magnesium  Tomorrow morning,   STAT        10/29/20 2013   10/30/20 0500  Comprehensive metabolic panel  Tomorrow morning,   STAT        10/29/20 2013   10/30/20 0500  CBC  Tomorrow morning,   STAT        10/29/20 2013   10/30/20 0500  Blood gas, venous  Tomorrow morning,   STAT        10/29/20 2013          Vitals/Pain Today's Vitals   10/29/20 2142 10/29/20 2200 10/29/20 2230 10/29/20 2300  BP:  125/61  (!) 117/59  Pulse:  (!) 104 99 (!) 102  Resp:  15 19 (!) 21  Temp:      TempSrc:      SpO2:  97% 95% 98%  Weight:      Height:      PainSc: 0-No pain       Isolation Precautions No active isolations  Medications Medications  lactated ringers infusion ( Intravenous New Bag/Given 10/29/20 1938)  aspirin EC tablet 81 mg (has no administration in time range)  hydroxychloroquine (PLAQUENIL) tablet 200 mg (has no administration in time range)  pravastatin (PRAVACHOL) tablet 20 mg (has no administration in time range)  metoprolol tartrate (LOPRESSOR) tablet 50 mg (0 mg Oral Hold 10/29/20 2110)  hydrOXYzine (ATARAX/VISTARIL) tablet 25 mg (has no administration in time range)  pantoprazole (PROTONIX) EC tablet 40 mg (has no administration in time range)  gabapentin (NEURONTIN) capsule 100 mg (has no administration in time range)  benzonatate (TESSALON) capsule 100 mg (has no administration in time range)  levocetirizine (XYZAL) tablet 5 mg (has no administration in time range)  ipratropium-albuterol (DUONEB) 0.5-2.5 (3) MG/3ML nebulizer solution 3 mL (3 mLs Nebulization Given 10/29/20 2115)  albuterol (PROVENTIL) (2.5 MG/3ML) 0.083% nebulizer  solution 2.5 mg (has no administration in time range)  methylPREDNISolone sodium succinate (SOLU-MEDROL) 40 mg/mL injection 40 mg (40 mg Intravenous Given 10/29/20 2115)  insulin aspart (novoLOG) injection 0-9 Units (has no administration in time range)  heparin injection 5,000 Units (5,000 Units Subcutaneous Given 10/29/20 2115)  acetaminophen (TYLENOL) tablet 650 mg (has no administration in time range)    Or  acetaminophen (TYLENOL) suppository 650 mg (has no administration in time range)  ipratropium-albuterol (DUONEB) 0.5-2.5 (3) MG/3ML nebulizer solution 3 mL (3 mLs Nebulization Given 10/29/20 1705)  ipratropium-albuterol (DUONEB) 0.5-2.5 (3) MG/3ML nebulizer solution 3 mL (3 mLs Nebulization Given 10/29/20 1705)  sodium chloride 0.9 % bolus 1,000 mL (0 mLs Intravenous Stopped 10/29/20 2051)  sodium chloride 0.9 % bolus 1,000 mL (0 mLs Intravenous Stopped 10/29/20 2141)    Mobility walks with device Low fall risk   Focused Assessments  Pulmonary Assessment Handoff:  Lung sounds:   O2 Device: Room Air        R Recommendations: See Admitting Provider Note  Report given to:   Additional Notes:

## 2020-10-29 NOTE — ED Notes (Signed)
Pt to ED for sHOB, expiratory wheezes and cough noted. Ambulatory with NAD

## 2020-10-29 NOTE — ED Notes (Signed)
Pt provided with warm blankets.

## 2020-10-29 NOTE — ED Notes (Signed)
Dr Darnelle Catalan notified of sbp 70's,pt stating she feels as if shes going to faint. To bedside and another bolus ordered and started

## 2020-10-29 NOTE — ED Notes (Signed)
Took over care of pt. Pt given warm blanket for comfort. Pt resting comfortably and has no other requests at this time. VSS. Awaiting further orders. Will continue to monitor.

## 2020-10-29 NOTE — ED Notes (Signed)
Contacted nuclear medicine about V/Q scan. Per nuclear medicine pt will be scanned in the morning. Pt in NAD at this time. VSS. Awaiting further orders. Will continue to monitor.

## 2020-10-29 NOTE — ED Triage Notes (Signed)
Pt comes into the ED via POV c/o cough, SHOB, and right side upper back pain.  Pt states she has COPD, asthma, and long term side effects from COVID.  Pt attempted to call her pulmonologist with no luck.  Pt states he O2 levels at home have dropped down to 93% RA.  Pt has O2 for PRN use. Pt has even and unlabored respirations at this time.

## 2020-10-30 DIAGNOSIS — J441 Chronic obstructive pulmonary disease with (acute) exacerbation: Principal | ICD-10-CM

## 2020-10-30 LAB — COMPREHENSIVE METABOLIC PANEL
ALT: 19 U/L (ref 0–44)
AST: 22 U/L (ref 15–41)
Albumin: 3.3 g/dL — ABNORMAL LOW (ref 3.5–5.0)
Alkaline Phosphatase: 90 U/L (ref 38–126)
Anion gap: 11 (ref 5–15)
BUN: 22 mg/dL (ref 8–23)
CO2: 22 mmol/L (ref 22–32)
Calcium: 9.1 mg/dL (ref 8.9–10.3)
Chloride: 105 mmol/L (ref 98–111)
Creatinine, Ser: 1.28 mg/dL — ABNORMAL HIGH (ref 0.44–1.00)
GFR, Estimated: 46 mL/min — ABNORMAL LOW (ref >60)
Glucose, Bld: 181 mg/dL — ABNORMAL HIGH (ref 70–99)
Potassium: 4.2 mmol/L (ref 3.5–5.1)
Sodium: 138 mmol/L (ref 135–145)
Total Bilirubin: 0.7 mg/dL (ref 0.3–1.2)
Total Protein: 6.5 g/dL (ref 6.5–8.1)

## 2020-10-30 LAB — CBC
HCT: 34.5 % — ABNORMAL LOW (ref 36.0–46.0)
Hemoglobin: 11.5 g/dL — ABNORMAL LOW (ref 12.0–15.0)
MCH: 29 pg (ref 26.0–34.0)
MCHC: 33.3 g/dL (ref 30.0–36.0)
MCV: 86.9 fL (ref 80.0–100.0)
Platelets: 228 10*3/uL (ref 150–400)
RBC: 3.97 MIL/uL (ref 3.87–5.11)
RDW: 12.6 % (ref 11.5–15.5)
WBC: 3.3 10*3/uL — ABNORMAL LOW (ref 4.0–10.5)
nRBC: 0 % (ref 0.0–0.2)

## 2020-10-30 LAB — GLUCOSE, CAPILLARY
Glucose-Capillary: 119 mg/dL — ABNORMAL HIGH (ref 70–99)
Glucose-Capillary: 119 mg/dL — ABNORMAL HIGH (ref 70–99)
Glucose-Capillary: 175 mg/dL — ABNORMAL HIGH (ref 70–99)
Glucose-Capillary: 195 mg/dL — ABNORMAL HIGH (ref 70–99)
Glucose-Capillary: 265 mg/dL — ABNORMAL HIGH (ref 70–99)

## 2020-10-30 LAB — BLOOD GAS, VENOUS
Acid-Base Excess: 0 mmol/L (ref 0.0–2.0)
Bicarbonate: 24.8 mmol/L (ref 20.0–28.0)
O2 Saturation: 91.7 %
Patient temperature: 37
pCO2, Ven: 40 mmHg — ABNORMAL LOW (ref 44.0–60.0)
pH, Ven: 7.4 (ref 7.250–7.430)
pO2, Ven: 63 mmHg — ABNORMAL HIGH (ref 32.0–45.0)

## 2020-10-30 LAB — PHOSPHORUS: Phosphorus: 3.2 mg/dL (ref 2.5–4.6)

## 2020-10-30 LAB — MAGNESIUM: Magnesium: 1.7 mg/dL (ref 1.7–2.4)

## 2020-10-30 MED ORDER — AZITHROMYCIN 500 MG PO TABS
500.0000 mg | ORAL_TABLET | Freq: Every day | ORAL | Status: AC
  Administered 2020-10-30: 09:00:00 500 mg via ORAL
  Filled 2020-10-30: qty 1

## 2020-10-30 MED ORDER — AZITHROMYCIN 250 MG PO TABS
250.0000 mg | ORAL_TABLET | Freq: Every day | ORAL | Status: DC
  Administered 2020-10-31 – 2020-11-01 (×2): 250 mg via ORAL
  Filled 2020-10-30 (×2): qty 1

## 2020-10-30 MED ORDER — LACTATED RINGERS IV SOLN: INTRAVENOUS | Status: DC

## 2020-10-30 NOTE — Progress Notes (Addendum)
PROGRESS NOTE  BRICIA TAHER ZOX:[id number] DOB: 11/22/1951 DOA: 10/29/2020 PCP: Jerl Mina, MD  HPI/Recap of past 24 hours: Lindley Magnus is a 69 y.o. female with medical history significant for hypertension, hyperlipidemia, type 2 diabetes mellitus, COPD, asthma, rheumatoid arthritis on Plaquenil, who is admitted to [facility] on 10/29/2020 with complaints of shortness of breath, associated with nonproductive cough for 1 week duration.  10/30/20:Seen and examined at her bedside.  She reports persistent nonproductive cough.  She denies any chest pain.  Wheezing on exam, IV Solu-Medrol, added Z-Pak for its anti-inflammatory properties.  Assessment/Plan: Principal Problem:   Acute exacerbation of chronic obstructive pulmonary disease (COPD) (HCC) Active Problems:   Gastro-esophageal reflux disease without esophagitis   Rheumatoid arthritis involving multiple joints (HCC)   HLD (hyperlipidemia)   AKI (acute kidney injury) (HCC)   Hypotension  Acute COPD exacerbation/asthma exacerbation Personally reviewed chest x-ray done on 10/29/2020 no active disease noted. She was started on IV Solu-Medrol 40 mg every 6 hours, continue Continue bronchodilators scheduled Added Z-Pak, incentive spirometer and flutter valve.  Chronic nocturnal hypoxia She self-reports she is on 1-2 L nasal cannula nightly, continue Currently she is saturating in the mid to high 90s on room air.  Tachycardia Continue home Lopressor 50 mg twice daily  Chronic diastolic CHF Last 2D echo done on 01/23/2020 showed normal LVEF 55 to 60% Continue home regimen Start strict I's and O's and daily weights Closely monitor volume status while on IV fluid We will decrease rate of lactated Ringer's to 50 cc/h from 100 cc/h x 1 day.  Rheumatoid arthritis Continue home Plaquenil  GERD/hyperlipidemia/polyneuropathy Continue home regimen  History of COVID-19 viral pneumonia Follows with Dr. Everardo All  and has a follow up appointment on Monday 11/03/20.   Code Status: Full code  Family Communication: None at bedside  Disposition Plan: Likely will discharge to home on 11/01/2020   Consultants:  Non  Procedures:  None  Antimicrobials:  Z-Pak  DVT prophylaxis: Subcu heparin 3 times daily  Status is: Inpatient    Dispo:  Patient From: Home  Planned Disposition: Home  Expected discharge date: 11/01/2020  Medically stable for discharge: No, ongoing management of COPD/asthma exacerbation.         Objective: Vitals:   10/30/20 0515 10/30/20 0820 10/30/20 1445 10/30/20 1527  BP: 138/82   138/74  Pulse: (!) 104   (!) 103  Resp: 20   16  Temp: (!) 97.5 F (36.4 C)   97.8 F (36.6 C)  TempSrc:    Oral  SpO2: 97% 97% 97% 98%  Weight: 72.3 kg     Height:        Intake/Output Summary (Last 24 hours) at 10/30/2020 1609 Last data filed at 10/30/2020 1409 Gross per 24 hour  Intake 3436.95 ml  Output --  Net 3436.95 ml   Filed Weights   10/29/20 1230 10/30/20 0011 10/30/20 0515  Weight: 74.8 kg 72.6 kg 72.3 kg    Exam:  . General: 69 y.o. year-old female well developed well nourished in no acute distress.  Alert and oriented x3. . Cardiovascular: Regular rate and rhythm with no rubs or gallops.  No thyromegaly or JVD noted.   Marland Kitchen Respiratory: Mild rales at bases with diffuse wheezing.  Good respiratory effort.   . Abdomen: Soft nontender nondistended with normal bowel sounds x4 quadrants. . Musculoskeletal: No lower extremity edema. 2/4 pulses in all 4 extremities. . Skin: No ulcerative lesions noted or rashes, .  Psychiatry: Mood is appropriate for condition and setting   Data Reviewed: CBC: Recent Labs  Lab 10/29/20 1232 10/30/20 0506  WBC 6.7 3.3*  HGB 12.9 11.5*  HCT 39.4 34.5*  MCV 87.0 86.9  PLT 280 228   Basic Metabolic Panel: Recent Labs  Lab 10/29/20 1232 10/29/20 1811 10/30/20 0506  NA 138  --  138  K 4.2  --  4.2  CL 99  --  105   CO2 25  --  22  GLUCOSE 120*  --  181*  BUN 24*  --  22  CREATININE 2.13*  --  1.28*  CALCIUM 9.2  --  9.1  MG  --  1.9 1.7  PHOS  --   --  3.2   GFR: Estimated Creatinine Clearance: 42.8 mL/min (A) (by C-G formula based on SCr of 1.28 mg/dL (H)). Liver Function Tests: Recent Labs  Lab 10/29/20 1232 10/30/20 0506  AST 30 22  ALT 19 19  ALKPHOS 97 90  BILITOT 0.7 0.7  PROT 7.4 6.5  ALBUMIN 3.9 3.3*   No results for input(s): LIPASE, AMYLASE in the last 168 hours. No results for input(s): AMMONIA in the last 168 hours. Coagulation Profile: No results for input(s): INR, PROTIME in the last 168 hours. Cardiac Enzymes: Recent Labs  Lab 10/29/20 2014  CKTOTAL 79   BNP (last 3 results) No results for input(s): PROBNP in the last 8760 hours. HbA1C: No results for input(s): HGBA1C in the last 72 hours. CBG: Recent Labs  Lab 10/29/20 2151 10/30/20 0012 10/30/20 0843 10/30/20 1213  GLUCAP 75 119* 175* 119*   Lipid Profile: No results for input(s): CHOL, HDL, LDLCALC, TRIG, CHOLHDL, LDLDIRECT in the last 72 hours. Thyroid Function Tests: No results for input(s): TSH, T4TOTAL, FREET4, T3FREE, THYROIDAB in the last 72 hours. Anemia Panel: No results for input(s): VITAMINB12, FOLATE, FERRITIN, TIBC, IRON, RETICCTPCT in the last 72 hours. Urine analysis:    Component Value Date/Time   COLORURINE STRAW (A) 10/29/2020 2109   APPEARANCEUR CLEAR (A) 10/29/2020 2109   LABSPEC 1.006 10/29/2020 2109   PHURINE 5.0 10/29/2020 2109   GLUCOSEU NEGATIVE 10/29/2020 2109   HGBUR NEGATIVE 10/29/2020 2109   BILIRUBINUR NEGATIVE 10/29/2020 2109   KETONESUR NEGATIVE 10/29/2020 2109   PROTEINUR NEGATIVE 10/29/2020 2109   NITRITE NEGATIVE 10/29/2020 2109   LEUKOCYTESUR NEGATIVE 10/29/2020 2109   Sepsis Labs: @LABRCNTIP (procalcitonin:4,lacticidven:4)  ) Recent Results (from the past 240 hour(s))  Resp Panel by RT-PCR (Flu A&B, Covid) Nasopharyngeal Swab     Status: None    Collection Time: 10/29/20  4:53 PM   Specimen: Nasopharyngeal Swab; Nasopharyngeal(NP) swabs in vial transport medium  Result Value Ref Range Status   SARS Coronavirus 2 by RT PCR NEGATIVE NEGATIVE Final    Comment: (NOTE) SARS-CoV-2 target nucleic acids are NOT DETECTED.  The SARS-CoV-2 RNA is generally detectable in upper respiratory specimens during the acute phase of infection. The lowest concentration of SARS-CoV-2 viral copies this assay can detect is 138 copies/mL. A negative result does not preclude SARS-Cov-2 infection and should not be used as the sole basis for treatment or other patient management decisions. A negative result may occur with  improper specimen collection/handling, submission of specimen other than nasopharyngeal swab, presence of viral mutation(s) within the areas targeted by this assay, and inadequate number of viral copies(<138 copies/mL). A negative result must be combined with clinical observations, patient history, and epidemiological information. The expected result is Negative.  Fact Sheet for Patients:  14/08/21  Fact Sheet for Healthcare Providers:  SeriousBroker.it  This test is no t yet approved or cleared by the [place] FDA and  has been authorized for detection and/or diagnosis of SARS-CoV-2 by FDA under an Emergency Use Authorization (EUA). This EUA will remain  in effect (meaning this test can be used) for the duration of the COVID-19 declaration under Section 564(b)(1) of the Act, 21 U.S.C.section 360bbb-3(b)(1), unless the authorization is terminated  or revoked sooner.       Influenza A by PCR NEGATIVE NEGATIVE Final   Influenza B by PCR NEGATIVE NEGATIVE Final    Comment: (NOTE) The Xpert Xpress SARS-CoV-2/FLU/RSV plus assay is intended as an aid in the diagnosis of influenza from Nasopharyngeal swab specimens and should not be used as a sole basis for treatment.  Nasal washings and aspirates are unacceptable for Xpert Xpress SARS-CoV-2/FLU/RSV testing.  Fact Sheet for Patients: BloggerCourse.com  Fact Sheet for Healthcare Providers: SeriousBroker.it  This test is not yet approved or cleared by the [place] FDA and has been authorized for detection and/or diagnosis of SARS-CoV-2 by FDA under an Emergency Use Authorization (EUA). This EUA will remain in effect (meaning this test can be used) for the duration of the COVID-19 declaration under Section 564(b)(1) of the Act, 21 U.S.C. section 360bbb-3(b)(1), unless the authorization is terminated or revoked.  Performed at [facility], [address] [place], [place] [address]       Studies: CT Renal Stone Study  Result Date: 10/29/2020 CLINICAL DATA:  Right-sided pain EXAM: CT ABDOMEN AND PELVIS WITHOUT CONTRAST TECHNIQUE: Multidetector CT imaging of the abdomen and pelvis was performed following the standard protocol without IV contrast. COMPARISON:  CT 03/02/2020 FINDINGS: Lower chest: Lung bases demonstrate linear scarring within the subpleural lower lobes. No acute consolidation or effusion. Normal cardiac size. Hepatobiliary: No focal liver abnormality is seen. No gallstones, gallbladder wall thickening, or biliary dilatation. Pancreas: Unremarkable. No pancreatic ductal dilatation or surrounding inflammatory changes. Spleen: Normal in size without focal abnormality. Adrenals/Urinary Tract: Adrenal glands are within normal limits. Kidneys show no hydronephrosis. The urinary bladder is unremarkable Stomach/Bowel: Stomach is nonenlarged. No dilated small bowel. No bowel wall thickening. Nonvisualized appendix but no right lower quadrant inflammatory process. Vascular/Lymphatic: Mild aortic atherosclerosis without aneurysm. No suspicious nodes Reproductive: Uterus and bilateral adnexa are unremarkable. Other: Negative for free air or  free fluid. Musculoskeletal: Diffuse degenerative changes throughout the lumbar spine. No acute or suspicious osseous abnormality. IMPRESSION: No CT evidence for acute intra-abdominal or pelvic abnormality. Aortic Atherosclerosis (ICD10-I70.0). Electronically Signed   By: Jasmine Pang M.D.   On: 10/29/2020 17:19    Scheduled Meds: . aspirin EC  81 mg Oral Daily  . [START ON 10/31/2020] azithromycin  250 mg Oral Daily  . cetirizine  10 mg Oral QPM  . gabapentin  100 mg Oral QHS  . heparin  5,000 Units Subcutaneous Q8H  . hydroxychloroquine  200 mg Oral Daily  . insulin aspart  0-9 Units Subcutaneous TID WC  . ipratropium-albuterol  3 mL Nebulization Q6H WA  . methylPREDNISolone (SOLU-MEDROL) injection  40 mg Intravenous Q6H  . metoprolol tartrate  50 mg Oral BID  . pantoprazole  40 mg Oral Daily  . pravastatin  20 mg Oral q1800    Continuous Infusions: . lactated ringers 100 mL/hr at 10/30/20 1421     LOS: 1 day     Darlin Drop, MD [facility] Pager [phone]  If 7PM-7AM, please contact night-coverage [email or web address] Password [email or web address]  10/30/2020, 4:09 PM

## 2020-10-31 LAB — GLUCOSE, CAPILLARY
Glucose-Capillary: 222 mg/dL — ABNORMAL HIGH (ref 70–99)
Glucose-Capillary: 153 mg/dL — ABNORMAL HIGH (ref 70–99)
Glucose-Capillary: 262 mg/dL — ABNORMAL HIGH (ref 70–99)
Glucose-Capillary: 223 mg/dL — ABNORMAL HIGH (ref 70–99)

## 2020-10-31 MED ORDER — PREDNISONE 20 MG PO TABS
40.0000 mg | ORAL_TABLET | Freq: Every day | ORAL | Status: DC
  Administered 2020-11-01: 10:00:00 40 mg via ORAL
  Filled 2020-10-31: qty 2

## 2020-10-31 NOTE — Progress Notes (Signed)
Pt transferred from 1C in stable condition.  Pt oriented to room and unit.

## 2020-10-31 NOTE — Care Management Important Message (Signed)
Important Message  Patient Details  Name: Misty Briggs MRN: [id number] Date of Birth: 12-May-1951   Medicare Important Message Given:  N/A - LOS <3 / Initial given by admissions     [name] [name] 10/31/2020, 8:24 AM

## 2020-10-31 NOTE — Progress Notes (Signed)
PROGRESS NOTE  Misty Briggs UPJ:[id number] DOB: [birth year] DOA: 10/29/2020 PCP: Jerl Mina, MD  HPI/Recap of past 24 hours: Misty Briggs is a 69 y.o. female with medical history significant for hypertension, hyperlipidemia, type 2 diabetes mellitus, COPD, asthma, rheumatoid arthritis on Plaquenil, who is admitted to [facility] on 10/29/2020 with complaints of shortness of breath, associated with nonproductive cough for 1 week duration.  10/31/20: Seen and examined at bedside.  She feels better today.  Her cough and dyspnea are improving.  She denies any chest pain.  She follows with Dr. Everardo All outpatient and has upcoming appointment.  Currently weaning off IV steroids.   Assessment/Plan: Principal Problem:   Acute exacerbation of chronic obstructive pulmonary disease (COPD) (HCC) Active Problems:   Gastro-esophageal reflux disease without esophagitis   Rheumatoid arthritis involving multiple joints (HCC)   HLD (hyperlipidemia)   AKI (acute kidney injury) (HCC)   Hypotension  Acute COPD exacerbation/asthma exacerbation Personally reviewed chest x-ray done on 10/29/2020 no active disease noted. She was started on IV Solu-Medrol 40 mg every 6 hours, weaning off Continue bronchodilators scheduled Continue Z-Pak, incentive spirometer and flutter valve.  Chronic nocturnal hypoxia She self-reports she is on 1-2 L nasal cannula nightly, continue Currently she is saturating in the mid to high 90s on room air.  Tachycardia Continue home Lopressor 50 mg twice daily  Chronic diastolic CHF Last 2D echo done on 01/23/2020 showed normal LVEF 55 to 60% Continue home regimen Start strict I's and O's and daily weights DC IV fluid  Rheumatoid arthritis Continue home Plaquenil  GERD/hyperlipidemia/polyneuropathy Continue home regimen  History of COVID-19 viral pneumonia Follows with Dr. Everardo All and has a follow up appointment on Monday 11/03/20.   Code Status:  Full code  Family Communication: None at bedside  Disposition Plan: Likely will discharge to home on 11/01/2020   Consultants:  Non  Procedures:  None  Antimicrobials:  Z-Pak  DVT prophylaxis: Subcu heparin 3 times daily  Status is: Inpatient    Dispo:  Patient From: Home  Planned Disposition: Home  Expected discharge date: 11/01/2020  Medically stable for discharge: No, ongoing management of COPD/asthma exacerbation.         Objective: Vitals:   10/31/20 0911 10/31/20 1217 10/31/20 1356 10/31/20 1604  BP:    (!) 153/75  Pulse:  81  94  Resp:  18  16  Temp:  98.6 F (37 C)  98.6 F (37 C)  TempSrc:  Oral    SpO2: 99% 100% 98% 96%  Weight:      Height:        Intake/Output Summary (Last 24 hours) at 10/31/2020 1737 Last data filed at 10/31/2020 1434 Gross per 24 hour  Intake 720 ml  Output 450 ml  Net 270 ml   Filed Weights   10/30/20 0011 10/30/20 0515 10/31/20 0500  Weight: 72.6 kg 72.3 kg 72.5 kg    Exam:  . General: 69 y.o. year-old female well-developed well-nourished nontoxic.  Alert and oriented x3.   . Cardiovascular: Regular rate and rhythm no rubs or gallops.  Marland Kitchen Respiratory: Clear to auscultation with no wheezes or rales.  . Abdomen: Soft nontender normal bowel sounds  . Musculoskeletal: No lower extremity edema bilaterally.   Marland Kitchen Psychiatry: Mood is appropriate for condition and setting.   Data Reviewed: CBC: Recent Labs  Lab 10/29/20 1232 10/30/20 0506  WBC 6.7 3.3*  HGB 12.9 11.5*  HCT 39.4 34.5*  MCV 87.0 86.9  PLT  280 228   Basic Metabolic Panel: Recent Labs  Lab 10/29/20 1232 10/29/20 1811 10/30/20 0506  NA 138  --  138  K 4.2  --  4.2  CL 99  --  105  CO2 25  --  22  GLUCOSE 120*  --  181*  BUN 24*  --  22  CREATININE 2.13*  --  1.28*  CALCIUM 9.2  --  9.1  MG  --  1.9 1.7  PHOS  --   --  3.2   GFR: Estimated Creatinine Clearance: 42.9 mL/min (A) (by C-G formula based on SCr of 1.28 mg/dL  (H)). Liver Function Tests: Recent Labs  Lab 10/29/20 1232 10/30/20 0506  AST 30 22  ALT 19 19  ALKPHOS 97 90  BILITOT 0.7 0.7  PROT 7.4 6.5  ALBUMIN 3.9 3.3*   No results for input(s): LIPASE, AMYLASE in the last 168 hours. No results for input(s): AMMONIA in the last 168 hours. Coagulation Profile: No results for input(s): INR, PROTIME in the last 168 hours. Cardiac Enzymes: Recent Labs  Lab 10/29/20 2014  CKTOTAL 79   BNP (last 3 results) No results for input(s): PROBNP in the last 8760 hours. HbA1C: No results for input(s): HGBA1C in the last 72 hours. CBG: Recent Labs  Lab 10/30/20 1627 10/30/20 2034 10/31/20 0824 10/31/20 1203 10/31/20 1605  GLUCAP 195* 265* 222* 153* 262*   Lipid Profile: No results for input(s): CHOL, HDL, LDLCALC, TRIG, CHOLHDL, LDLDIRECT in the last 72 hours. Thyroid Function Tests: No results for input(s): TSH, T4TOTAL, FREET4, T3FREE, THYROIDAB in the last 72 hours. Anemia Panel: No results for input(s): VITAMINB12, FOLATE, FERRITIN, TIBC, IRON, RETICCTPCT in the last 72 hours. Urine analysis:    Component Value Date/Time   COLORURINE STRAW (A) 10/29/2020 2109   APPEARANCEUR CLEAR (A) 10/29/2020 2109   LABSPEC 1.006 10/29/2020 2109   PHURINE 5.0 10/29/2020 2109   GLUCOSEU NEGATIVE 10/29/2020 2109   HGBUR NEGATIVE 10/29/2020 2109   BILIRUBINUR NEGATIVE 10/29/2020 2109   KETONESUR NEGATIVE 10/29/2020 2109   PROTEINUR NEGATIVE 10/29/2020 2109   NITRITE NEGATIVE 10/29/2020 2109   LEUKOCYTESUR NEGATIVE 10/29/2020 2109   Sepsis Labs: @LABRCNTIP (procalcitonin:4,lacticidven:4)  ) Recent Results (from the past 240 hour(s))  Resp Panel by RT-PCR (Flu A&B, Covid) Nasopharyngeal Swab     Status: None   Collection Time: 10/29/20  4:53 PM   Specimen: Nasopharyngeal Swab; Nasopharyngeal(NP) swabs in vial transport medium  Result Value Ref Range Status   SARS Coronavirus 2 by RT PCR NEGATIVE NEGATIVE Final    Comment: (NOTE) SARS-CoV-2  target nucleic acids are NOT DETECTED.  The SARS-CoV-2 RNA is generally detectable in upper respiratory specimens during the acute phase of infection. The lowest concentration of SARS-CoV-2 viral copies this assay can detect is 138 copies/mL. A negative result does not preclude SARS-Cov-2 infection and should not be used as the sole basis for treatment or other patient management decisions. A negative result may occur with  improper specimen collection/handling, submission of specimen other than nasopharyngeal swab, presence of viral mutation(s) within the areas targeted by this assay, and inadequate number of viral copies(<138 copies/mL). A negative result must be combined with clinical observations, patient history, and epidemiological information. The expected result is Negative.  Fact Sheet for Patients:  14/08/21  Fact Sheet for Healthcare Providers:  BloggerCourse.com  This test is no t yet approved or cleared by the [place] FDA and  has been authorized for detection and/or diagnosis of SARS-CoV-2 by FDA  under an Emergency Use Authorization (EUA). This EUA will remain  in effect (meaning this test can be used) for the duration of the COVID-19 declaration under Section 564(b)(1) of the Act, 21 U.S.C.section 360bbb-3(b)(1), unless the authorization is terminated  or revoked sooner.       Influenza A by PCR NEGATIVE NEGATIVE Final   Influenza B by PCR NEGATIVE NEGATIVE Final    Comment: (NOTE) The Xpert Xpress SARS-CoV-2/FLU/RSV plus assay is intended as an aid in the diagnosis of influenza from Nasopharyngeal swab specimens and should not be used as a sole basis for treatment. Nasal washings and aspirates are unacceptable for Xpert Xpress SARS-CoV-2/FLU/RSV testing.  Fact Sheet for Patients: BloggerCourse.com  Fact Sheet for Healthcare  Providers: SeriousBroker.it  This test is not yet approved or cleared by the [place] FDA and has been authorized for detection and/or diagnosis of SARS-CoV-2 by FDA under an Emergency Use Authorization (EUA). This EUA will remain in effect (meaning this test can be used) for the duration of the COVID-19 declaration under Section 564(b)(1) of the Act, 21 U.S.C. section 360bbb-3(b)(1), unless the authorization is terminated or revoked.  Performed at [facility], [address] [place], [place] [address]       Studies: No results found.  Scheduled Meds: . aspirin EC  81 mg Oral Daily  . azithromycin  250 mg Oral Daily  . cetirizine  10 mg Oral QPM  . gabapentin  100 mg Oral QHS  . heparin  5,000 Units Subcutaneous Q8H  . hydroxychloroquine  200 mg Oral Daily  . insulin aspart  0-9 Units Subcutaneous TID WC  . ipratropium-albuterol  3 mL Nebulization Q6H WA  . methylPREDNISolone (SOLU-MEDROL) injection  40 mg Intravenous Q6H  . metoprolol tartrate  50 mg Oral BID  . pantoprazole  40 mg Oral Daily  . pravastatin  20 mg Oral q1800    Continuous Infusions:    LOS: 2 days     Darlin Drop, MD [facility] Pager [phone]  If 7PM-7AM, please contact night-coverage [email or web address] Password [facility] 10/31/2020, 5:37 PM

## 2020-11-01 LAB — GLUCOSE, CAPILLARY
Glucose-Capillary: 128 mg/dL — ABNORMAL HIGH (ref 70–99)
Glucose-Capillary: 167 mg/dL — ABNORMAL HIGH (ref 70–99)

## 2020-11-01 MED ORDER — PREDNISONE 20 MG PO TABS: 20.0000 mg | ORAL_TABLET | Freq: Every day | ORAL | 0 refills | Status: DC

## 2020-11-01 MED ORDER — AZITHROMYCIN 250 MG PO TABS
ORAL_TABLET | ORAL | 0 refills | Status: DC
Start: 2020-11-01 — End: 2021-07-14

## 2020-11-01 MED ORDER — AMLODIPINE BESYLATE 5 MG PO TABS: 5.0000 mg | ORAL_TABLET | Freq: Every day | ORAL | 0 refills | Status: AC

## 2020-11-01 MED ORDER — FLUCONAZOLE 50 MG PO TABS
150.0000 mg | ORAL_TABLET | Freq: Once | ORAL | Status: DC
  Filled 2020-11-01: qty 1

## 2020-11-01 MED ORDER — AMLODIPINE BESYLATE 5 MG PO TABS
5.0000 mg | ORAL_TABLET | Freq: Every evening | ORAL | Status: DC
  Administered 2020-11-01: 10:00:00 5 mg via ORAL
  Filled 2020-11-01: qty 1

## 2020-11-01 MED ORDER — PREDNISONE 5 MG PO TABS: 40.0000 mg | ORAL_TABLET | Freq: Every day | ORAL | 0 refills | Status: AC

## 2020-11-01 NOTE — TOC Transition Note (Addendum)
Transition of Care [facility]) - CM/SW Discharge Note   Patient Details  Name: Misty Briggs MRN: [id number] Date of Birth: [birth year]  Transition of Care [facility]) CM/SW Contact:  Bing Quarry, RN Phone Number: 11/01/2020, 1:07 PM   Clinical Narrative:     12/11. 1300 Discharge orders written to Home/Self Care and to resume oxygen from PTA services/needs.    Pt was reportedly on 1-2 L/[place] at home PTA from DME oxygen company [employer] out of [place] for the records.   No other HH or DME needs ordered.   Follow up appointment  With Dr. Everardo All Monday 12/13 with PA per provider DC summary   CM will sign off on discharge.  Gabriel Cirri RN CM.   NOTE: PTA oxygen DME [place], [place].   Final next level of care: Home/Self Care Barriers to Discharge: Barriers Resolved   Patient Goals and CMS Choice        Discharge Placement                       Discharge Plan and Services                DME Arranged: N/A (Continue with PTA home DME oxygen.)         HH Arranged: NA HH Agency: NA        Social Determinants of Health (SDOH) Interventions     Readmission Risk Interventions Readmission Risk Prevention Plan 03/06/2020  Transportation Screening Complete  PCP or Specialist Appt within 3-5 Days Complete  HRI or Home Care Consult Complete  Palliative Care Screening Not Applicable  Medication Review (RN Care Manager) Complete  Some recent data might be hidden

## 2020-11-01 NOTE — Discharge Instructions (Signed)
Chronic Obstructive Pulmonary Disease Exacerbation Chronic obstructive pulmonary disease (COPD) is a long-term (chronic) lung problem. In COPD, the flow of air from the lungs is limited. COPD exacerbations are times that breathing gets worse and you need more than your normal treatment. Without treatment, they can be life threatening. If they happen often, your lungs can become more damaged. If your COPD gets worse, your doctor may treat you with:  Medicines.  Oxygen.  Different ways to clear your airway, such as using a mask. Follow these instructions at home: Medicines  Take over-the-counter and prescription medicines only as told by your doctor.  If you take an antibiotic or steroid medicine, do not stop taking the medicine even if you start to feel better.  Keep up with shots (vaccinations) as told by your doctor. Be sure to get a yearly (annual) flu shot. Lifestyle  Do not smoke. If you need help quitting, ask your doctor.  Eat healthy foods.  Exercise regularly.  Get plenty of sleep.  Avoid tobacco smoke and other things that can bother your lungs.  Wash your hands often with soap and water. This will help keep you from getting an infection. If you cannot use soap and water, use hand sanitizer.  During flu season, avoid areas that are crowded with people. General instructions  Drink enough fluid to keep your pee (urine) clear or pale yellow. Do not do this if your doctor has told you not to.  Use a cool mist machine (vaporizer).  If you use oxygen or a machine that turns medicine into a mist (nebulizer), continue to use it as told.  Follow all instructions for rehabilitation. These are steps you can take to make your body work better.  Keep all follow-up visits as told by your doctor. This is important. Contact a doctor if:  Your COPD symptoms get worse than normal. Get help right away if:  You are short of breath and it gets worse.  You have trouble  talking.  You have chest pain.  You cough up blood.  You have a fever.  You keep throwing up (vomiting).  You feel weak or you pass out (faint).  You feel confused.  You are not able to sleep because of your symptoms.  You are not able to do daily activities. Summary  COPD exacerbations are times that breathing gets worse and you need more treatment than normal.  COPD exacerbations can be very serious and may cause your lungs to become more damaged.  Do not smoke. If you need help quitting, ask your doctor.  Stay up-to-date on your shots. Get a flu shot every year. This information is not intended to replace advice given to you by your health care provider. Make sure you discuss any questions you have with your health care provider. Document Revised: 10/21/2017 Document Reviewed: 12/13/2016 Elsevier Patient Education  2020 Elsevier Inc.  Asthma and Physical Activity Physical activity is an important part of a healthy lifestyle. If you have asthma, it is important to exercise because physical activity can help you to:  Control your asthma.  Maintain your weight or lose weight.  Increase your energy.  Decrease stress and anxiety.  Lower your risk of getting sick.  Improve your heart health. However, asthma symptoms can flare up when you are physically active or exercising. You can learn how to control your asthma and prevent symptoms during exercise. This will help you to remain physically active. How can asthma affect my ability to be  physically active? When you have asthma, physical activity can cause you to have symptoms such as:  Wheezing. This may sound like whistling while breathing.  A feeling of tightness in the chest.  Sore throat.  Coughing.  Shortness of breath.  Tiredness (fatigue) with minimal activity.  Increased sputum production.  Chest pain. What actions can I take to prevent asthma problems during physical activity? Pulmonary  rehabilitation Enroll in a pulmonary rehabilitation program. Benefits of this type of program include:  Education on lung diseases.  Classes that teach you how to exercise and be more active while decreasing your shortness of breath.  A group setting that allows you to talk with others who have asthma. Asthma action plan Follow the asthma action plan set by your health care provider. Your personal asthma plan may include:  Taking your medicines as told by your health care provider.  Avoiding your asthma triggers, except physical activity. Triggers may include cold air, dust, pollen, pet dander, and air pollution.  Tracking your asthma control.  Using a peak flow meter.  Being aware of worsening symptoms.  Knowing when to seek emergency care. Proper breathing During exercise, follow these tips for proper breathing:  Breathe in before starting the exercise and breathe out during the hardest part of the exercise.  Take slow breaths.  Pace yourself and do not try to go too fast.  While breathing out, purse your lips. Before beginning any exercise program or new activity, talk with your health care provider. Medicines If physical activity triggers your asthma, your health care provider may order the following medicines:  A rescue inhaler (short-acting beta2-agonist) for you to use shortly before physical activity or exercise. Its effects may reduce exercise-related symptoms for 2-3 hours.  A long-acting beta2-agonist that can offer up to 12 hours of relief if taken daily.  Leukotriene modifiers. These pills are taken several hours before physical activity or exercise to help prevent asthma symptoms that are caused by exercise.  Long-term control medicines. These will be given if you have severe or frequent asthma symptoms during or after exercise. These symptoms may also mean that your asthma is not well controlled.  General information  Exercise indoors when the air is dry  or during allergy season.  Try to breathe in warm, moist air by wearing a scarf over your nose and mouth or breathing only through your nose.  Spend a few minutes warming up before your workout.  Cool down after exercise. What should I do if my asthma symptoms get worse? Contact your health care provider if your asthma symptoms are getting worse. Your asthma is getting worse if:  You have symptoms more often.  Your symptoms are more severe.  Your symptoms get worse at night and make you lose sleep.  Your peak flow number is lower than your personal best or changes a lot from day to day.  Your asthma medicines do not work as well as they used to.  You use your rescue inhaler more often. If you use your rescue inhaler more than 2 days a week, your asthma is not well controlled.  You go to the emergency room or see your health care provider because of an asthma attack. Where to find support  Ask your health care provider about signing up for a pulmonary rehabilitation program.  Ask your health care provider about asthma support groups.  Visit your local community health department.  Check out local hospitals' community health programs. Where can I get  more information?  Your health care provider.  American Lung Association: lung.org  National Heart, Lung, and Blood Institute: BuffaloDryCleaner.gl Contact a health care provider if:  You have trouble walking and talking because you are out of breath. Get help right away if:  Your lips or fingernails are blue.  You are not able to breathe or catch your breath. Summary  Physical activity is an important part of a healthy lifestyle. However, if you have asthma, your symptoms can flare up during exercise or physical activity.  You can prevent problems during physical activity by doing pulmonary rehabilitation, following an asthma action plan, doing proper breathing, and using medicines.  Talk with your health care provider before  starting any exercise program or new activity. This information is not intended to replace advice given to you by your health care provider. Make sure you discuss any questions you have with your health care provider. Document Revised: 02/27/2019 Document Reviewed: 01/24/2018 Elsevier Patient Education  2020 [employer].

## 2020-11-01 NOTE — Discharge Summary (Signed)
Discharge Summary  Misty Briggs:[id number] DOB: 10-21-1951  PCP: Jerl Mina, MD  Admit date: 10/29/2020 Discharge date: 11/01/2020  Time spent: 35 minute  Recommendations for Outpatient Follow-up:  1. Follow-up with your pulmonologist within a week 2. Follow-up with your primary care provider in 1 to 2 weeks 3. Take your medications as prescribed  Discharge Diagnoses:  Active Hospital Problems   Diagnosis Date Noted  . Acute exacerbation of chronic obstructive pulmonary disease (COPD) (HCC) 10/29/2020  . AKI (acute kidney injury) (HCC) 10/29/2020  . Hypotension 10/29/2020  . HLD (hyperlipidemia) 02/29/2020  . Gastro-esophageal reflux disease without esophagitis 07/01/2015  . Rheumatoid arthritis involving multiple joints (HCC) 09/10/2014    Resolved Hospital Problems  No resolved problems to display.    Discharge Condition: Stable  Diet recommendation: Resume previous diet.  Vitals:   11/01/20 0815 11/01/20 1130  BP: (!) 158/88 (!) 153/81  Pulse: 96 76  Resp: 18 17  Temp: 98 F (36.7 C) 98.3 F (36.8 C)  SpO2: 96% 95%    History of present illness:  Misty P Jonesis a 69 y.o.femalewith medical history significant forhypertension, hyperlipidemia, type 2 diabetes mellitus, COPD, asthma, rheumatoid arthritis on Plaquenil,who is admitted to [facility] on 12/8/2021with complaints of shortness of breath, associated with nonproductive cough for 1 week duration.  Work-up revealed COPD/asthma exacerbation for which she received IV steroids, bronchodilators and Z-Pak.  11/01/20: Seen and examined at her bedside.  Vital signs and labs reviewed and are stable.  She still has a mild cough left.  Not hypoxic oxygen saturation 96% on room air.  Nontoxic-appearing.  Advised to continue with steroid taper and oral azithromycin.  She states she has an upcoming appointment with her pulmonologist's PA on Monday 11/03/20 and later with Dr.  Everardo All.   Hospital Course:  Principal Problem:   Acute exacerbation of chronic obstructive pulmonary disease (COPD) (HCC) Active Problems:   Gastro-esophageal reflux disease without esophagitis   Rheumatoid arthritis involving multiple joints (HCC)   HLD (hyperlipidemia)   AKI (acute kidney injury) (HCC)   Hypotension  Improving acute COPD exacerbation/asthma exacerbation Personally reviewed chest x-ray done on 10/29/2020 no active disease noted. She was started on IV Solu-Medrol 40 mg every 6 hours and weaned off to prednisone taper. Continue home bronchodilators Continue with p.o. azithromycin.  Resolving AKI likely prerenal in the setting of dehydration She presented with creatinine of 2.1 with GFR of 25 Baseline creatinine 0.7 with GFR greater than 60 Continue to avoid nephrotoxins Telmisartan held due to AKI Follow-up with your PCP in 1 to 2 weeks. Obtain BMP on 11/06/20 at PCPs office.  Essential hypertension Continue home Norvasc 5 mg daily P.o. Lopressor 50 mg twice daily Resume home Lasix 20 mg daily along with potassium replacement Telmisartan held due to AKI, follow-up with your PCP in 1 to 2 weeks.  Chronic nocturnal hypoxia She self-reports she is on 1 L nasal cannula nightly, continue Currently she is saturating 96% on room air.  Resolved tachycardia Continue home Lopressor 50 mg twice daily  Chronic diastolic CHF Last 2D echo done on 01/23/2020 showed normal LVEF 55 to 60% No acute issues.  Rheumatoid arthritis Continue home Plaquenil  GERD/hyperlipidemia/polyneuropathy Continue home regimen  History of COVID-19 viral pneumonia Follows with Dr. Everardo All and has a follow up appointment on Monday 11/03/20 with PA.   Code Status: Full code    Consultants:  Non  Procedures:  None  Antimicrobials:  Z-Pak   Discharge Exam: BP Marland Kitchen)  153/81 (BP Location: Right Arm)   Pulse 76   Temp 98.3 F (36.8 C) (Oral)   Resp 17   Ht 5\' 6"   (1.676 m)   Wt 72.5 kg   SpO2 95%   BMI 25.80 kg/m  . General: 69 y.o. year-old female well developed well nourished in no acute distress.  Alert and oriented x3. . Cardiovascular: Regular rate and rhythm with no rubs or gallops.  No thyromegaly or JVD noted.   73 Respiratory: Clear to auscultation with no wheezes or rales. Good inspiratory effort. . Abdomen: Soft nontender nondistended with normal bowel sounds x4 quadrants. . Musculoskeletal: No lower extremity edema bilaterally. Marland Kitchen Psychiatry: Mood is appropriate for condition and setting  Discharge Instructions You were cared for by a hospitalist during your hospital stay. If you have any questions about your discharge medications or the care you received while you were in the hospital after you are discharged, you can call the unit and asked to speak with the hospitalist on call if the hospitalist that took care of you is not available. Once you are discharged, your primary care physician will handle any further medical issues. Please note that NO REFILLS for any discharge medications will be authorized once you are discharged, as it is imperative that you return to your primary care physician (or establish a relationship with a primary care physician if you do not have one) for your aftercare needs so that they can reassess your need for medications and monitor your lab values.   Allergies as of 11/01/2020      Reactions   Doxycycline Other (See Comments)   Thrush   Penicillins Hives, Palpitations, Rash   Azithromycin Other (See Comments)   Thrush   Levofloxacin Other (See Comments)   Joint pain/muscle pain   Sulfa Antibiotics Rash      Medication List    STOP taking these medications   telmisartan 40 MG tablet Commonly known as: MICARDIS     TAKE these medications   albuterol 108 (90 Base) MCG/ACT inhaler Commonly known as: VENTOLIN HFA Inhale 1-2 puffs into the lungs every 6 (six) hours as needed for wheezing or shortness  of breath.   albuterol (2.5 MG/3ML) 0.083% nebulizer solution Commonly known as: PROVENTIL Take 3 mLs (2.5 mg total) by nebulization every 6 (six) hours.   amLODipine 5 MG tablet Commonly known as: NORVASC Take 1 tablet (5 mg total) by mouth daily. What changed: when to take this   aspirin EC 81 MG tablet Take by mouth daily.   azithromycin 250 MG tablet Commonly known as: ZITHROMAX Take 250 mg daily x 4 days   benzonatate 100 MG capsule Commonly known as: TESSALON Take 1 capsule (100 mg total) by mouth every 6 (six) hours as needed for cough.   Breztri Aerosphere 160-9-4.8 MCG/ACT Aero Generic drug: Budeson-Glycopyrrol-Formoterol Inhale 2 puffs into the lungs in the morning and at bedtime.   calcium carbonate 1250 (500 Ca) MG tablet Commonly known as: OS-CAL - dosed in mg of elemental calcium Take 2 tablets by mouth daily with breakfast.   CO Q-10 PO Take by mouth daily.   EPINEPHrine 0.3 mg/0.3 mL Soaj injection Commonly known as: EPI-PEN Inject 0.3 mLs into the muscle once as needed for anaphylaxis.   ferrous sulfate 325 (65 FE) MG tablet Take 1 tablet (325 mg total) by mouth 2 (two) times daily with a meal.   fluticasone 50 MCG/ACT nasal spray Commonly known as: FLONASE Place 1 spray into both  nostrils daily as needed for allergies or rhinitis.   furosemide 20 MG tablet Commonly known as: LASIX Take 20 mg by mouth daily.   gabapentin 100 MG capsule Commonly known as: NEURONTIN Take 1 capsule by mouth at bedtime.   hydroxychloroquine 200 MG tablet Commonly known as: PLAQUENIL Take 200 mg by mouth daily.   hydrOXYzine 25 MG tablet Commonly known as: ATARAX/VISTARIL Take 25 mg by mouth every 4 (four) hours as needed for anxiety or itching.   levocetirizine 5 MG tablet Commonly known as: XYZAL Take 5 mg by mouth every evening.   lovastatin 20 MG tablet Commonly known as: MEVACOR Take 20 mg by mouth at bedtime.   magnesium gluconate 500 MG  tablet Commonly known as: MAGONATE Take 500 mg by mouth daily.   metFORMIN 1000 MG tablet Commonly known as: GLUCOPHAGE Take 1 tablet (1,000 mg total) by mouth 2 (two) times daily with a meal.   metoprolol tartrate 50 MG tablet Commonly known as: LOPRESSOR Take 50 mg by mouth 2 (two) times daily.   multivitamin capsule Take 1 capsule by mouth daily.   nystatin 100000 UNIT/ML suspension Commonly known as: MYCOSTATIN Use as directed 5 mLs in the mouth or throat 4 (four) times daily. Twice daily   omeprazole 20 MG capsule Commonly known as: PRILOSEC Take 20 mg by mouth daily.   potassium chloride 10 MEQ tablet Commonly known as: KLOR-CON Take 10 mEq by mouth daily.   predniSONE 5 MG tablet Commonly known as: DELTASONE Take 8 tablets (40 mg total) by mouth daily with breakfast for 10 days. Take 40 mg daily x 2 days then, Take 30 mg daily x2 days then, Take 20 mg daily x2 days then, Take 10 mg daily x2 days then, Take 5 mg daily x2 days then stop   vitamin B-12 1000 MCG tablet Commonly known as: CYANOCOBALAMIN Take 1,000 mcg by mouth daily.   vitamin C 1000 MG tablet Take 1,000 mg by mouth daily.      Allergies  Allergen Reactions  . Doxycycline Other (See Comments)    Thrush  . Penicillins Hives, Palpitations and Rash  . Azithromycin Other (See Comments)    Thrush  . Levofloxacin Other (See Comments)    Joint pain/muscle pain  . Sulfa Antibiotics Rash    Follow-up Information    Jerl Mina, MD. Call in 1 day(s).   Specialty: Family Medicine Why: Please call for a post hospital follow up appointment Contact information: [address] [facility] [place] [address] [phone]        Luciano Cutter, MD. Call in 1 day(s).   Specialty: Pulmonary Disease Why: please call for a post hospital follow up appointment Contact information: [address] [place] [address] [place] [address] [phone]                The results of  significant diagnostics from this hospitalization (including imaging, microbiology, ancillary and laboratory) are listed below for reference.    Significant Diagnostic Studies: DG Chest 2 View  Result Date: 10/29/2020 CLINICAL DATA:  Shortness of breath EXAM: CHEST - 2 VIEW COMPARISON:  None. FINDINGS: The heart size and mediastinal contours are within normal limits. Mild chronic interstitial prominence. No new consolidation or edema. No pleural effusion or pneumothorax. No acute osseous abnormality. IMPRESSION: No acute process in the chest. Electronically Signed   By: Guadlupe Spanish M.D.   On: 10/29/2020 14:00   CT Renal Stone Study  Result Date: 10/29/2020  CLINICAL DATA:  Right-sided pain EXAM: CT ABDOMEN AND PELVIS WITHOUT CONTRAST TECHNIQUE: Multidetector CT imaging of the abdomen and pelvis was performed following the standard protocol without IV contrast. COMPARISON:  CT 03/02/2020 FINDINGS: Lower chest: Lung bases demonstrate linear scarring within the subpleural lower lobes. No acute consolidation or effusion. Normal cardiac size. Hepatobiliary: No focal liver abnormality is seen. No gallstones, gallbladder wall thickening, or biliary dilatation. Pancreas: Unremarkable. No pancreatic ductal dilatation or surrounding inflammatory changes. Spleen: Normal in size without focal abnormality. Adrenals/Urinary Tract: Adrenal glands are within normal limits. Kidneys show no hydronephrosis. The urinary bladder is unremarkable Stomach/Bowel: Stomach is nonenlarged. No dilated small bowel. No bowel wall thickening. Nonvisualized appendix but no right lower quadrant inflammatory process. Vascular/Lymphatic: Mild aortic atherosclerosis without aneurysm. No suspicious nodes Reproductive: Uterus and bilateral adnexa are unremarkable. Other: Negative for free air or free fluid. Musculoskeletal: Diffuse degenerative changes throughout the lumbar spine. No acute or suspicious osseous abnormality. IMPRESSION: No CT  evidence for acute intra-abdominal or pelvic abnormality. Aortic Atherosclerosis (ICD10-I70.0). Electronically Signed   By: Jasmine Pang M.D.   On: 10/29/2020 17:19    Microbiology: Recent Results (from the past 240 hour(s))  Resp Panel by RT-PCR (Flu A&B, Covid) Nasopharyngeal Swab     Status: None   Collection Time: 10/29/20  4:53 PM   Specimen: Nasopharyngeal Swab; Nasopharyngeal(NP) swabs in vial transport medium  Result Value Ref Range Status   SARS Coronavirus 2 by RT PCR NEGATIVE NEGATIVE Final    Comment: (NOTE) SARS-CoV-2 target nucleic acids are NOT DETECTED.  The SARS-CoV-2 RNA is generally detectable in upper respiratory specimens during the acute phase of infection. The lowest concentration of SARS-CoV-2 viral copies this assay can detect is 138 copies/mL. A negative result does not preclude SARS-Cov-2 infection and should not be used as the sole basis for treatment or other patient management decisions. A negative result may occur with  improper specimen collection/handling, submission of specimen other than nasopharyngeal swab, presence of viral mutation(s) within the areas targeted by this assay, and inadequate number of viral copies(<138 copies/mL). A negative result must be combined with clinical observations, patient history, and epidemiological information. The expected result is Negative.  Fact Sheet for Patients:  BloggerCourse.com  Fact Sheet for Healthcare Providers:  SeriousBroker.it  This test is no t yet approved or cleared by the [place] FDA and  has been authorized for detection and/or diagnosis of SARS-CoV-2 by FDA under an Emergency Use Authorization (EUA). This EUA will remain  in effect (meaning this test can be used) for the duration of the COVID-19 declaration under Section 564(b)(1) of the Act, 21 U.S.C.section 360bbb-3(b)(1), unless the authorization is terminated  or revoked sooner.        Influenza A by PCR NEGATIVE NEGATIVE Final   Influenza B by PCR NEGATIVE NEGATIVE Final    Comment: (NOTE) The Xpert Xpress SARS-CoV-2/FLU/RSV plus assay is intended as an aid in the diagnosis of influenza from Nasopharyngeal swab specimens and should not be used as a sole basis for treatment. Nasal washings and aspirates are unacceptable for Xpert Xpress SARS-CoV-2/FLU/RSV testing.  Fact Sheet for Patients: BloggerCourse.com  Fact Sheet for Healthcare Providers: SeriousBroker.it  This test is not yet approved or cleared by the [place] FDA and has been authorized for detection and/or diagnosis of SARS-CoV-2 by FDA under an Emergency Use Authorization (EUA). This EUA will remain in effect (meaning this test can be used) for the duration of the COVID-19 declaration under Section 564(b)(1) of  the Act, 21 U.S.C. section 360bbb-3(b)(1), unless the authorization is terminated or revoked.  Performed at [facility], [address] [place], [place] [address]      Labs: Basic Metabolic Panel: Recent Labs  Lab 10/29/20 1232 10/29/20 1811 10/30/20 0506  NA 138  --  138  K 4.2  --  4.2  CL 99  --  105  CO2 25  --  22  GLUCOSE 120*  --  181*  BUN 24*  --  22  CREATININE 2.13*  --  1.28*  CALCIUM 9.2  --  9.1  MG  --  1.9 1.7  PHOS  --   --  3.2   Liver Function Tests: Recent Labs  Lab 10/29/20 1232 10/30/20 0506  AST 30 22  ALT 19 19  ALKPHOS 97 90  BILITOT 0.7 0.7  PROT 7.4 6.5  ALBUMIN 3.9 3.3*   No results for input(s): LIPASE, AMYLASE in the last 168 hours. No results for input(s): AMMONIA in the last 168 hours. CBC: Recent Labs  Lab 10/29/20 1232 10/30/20 0506  WBC 6.7 3.3*  HGB 12.9 11.5*  HCT 39.4 34.5*  MCV 87.0 86.9  PLT 280 228   Cardiac Enzymes: Recent Labs  Lab 10/29/20 2014  CKTOTAL 79   BNP: BNP (last 3 results) No results for input(s): BNP in the last 8760  hours.  ProBNP (last 3 results) No results for input(s): PROBNP in the last 8760 hours.  CBG: Recent Labs  Lab 10/31/20 1203 10/31/20 1605 10/31/20 2111 11/01/20 0817 11/01/20 1207  GLUCAP 153* 262* 223* 128* 167*       Signed:  Darlin Drop, MD [facility] 11/01/2020, 1:02 PM

## 2020-11-01 NOTE — Progress Notes (Signed)
Pt discharged to home.  IV removed with complication.  AVS given to pt and explained with no further questions.  All belongings at bedside taken with pt.

## 2020-11-03 ENCOUNTER — Ambulatory Visit (INDEPENDENT_AMBULATORY_CARE_PROVIDER_SITE_OTHER): Payer: Medicare Other | Admitting: Pulmonary Disease

## 2020-11-03 ENCOUNTER — Encounter: Payer: Self-pay | Admitting: Pulmonary Disease

## 2020-11-03 ENCOUNTER — Other Ambulatory Visit: Payer: Self-pay

## 2020-11-03 VITALS — BP 128/68 | HR 68

## 2020-11-03 DIAGNOSIS — J449 Chronic obstructive pulmonary disease, unspecified: Secondary | ICD-10-CM | POA: Diagnosis not present

## 2020-11-03 DIAGNOSIS — Z8616 Personal history of COVID-19: Secondary | ICD-10-CM | POA: Insufficient documentation

## 2020-11-03 DIAGNOSIS — M069 Rheumatoid arthritis, unspecified: Secondary | ICD-10-CM | POA: Diagnosis not present

## 2020-11-03 DIAGNOSIS — J9611 Chronic respiratory failure with hypoxia: Secondary | ICD-10-CM | POA: Diagnosis not present

## 2020-11-03 HISTORY — DX: Personal history of COVID-19: Z86.16

## 2020-11-03 NOTE — Assessment & Plan Note (Signed)
Plan:  Continue Breztri  Finish prednisone taper as outlined Walk today in office today We will order pulmonary function testing Keep 4-week follow-up with Dr. Everardo All

## 2020-11-03 NOTE — Progress Notes (Signed)
  ID: Misty Briggs, female    DOB: Nov 26, 1950, 69 y.o.   MRN: [id number]  Chief Complaint  Patient presents with  . Follow-up    Referring provider: Jerl Mina, MD  HPI:  69 year old female never smoker followed in our office for asthma COPD overlap syndrome  PMH: Rheumatoid arthritis, hypertension, GERD, hyperlipidemia Smoker/ Smoking History: Never smoker Maintenance:  Breztri  Pt of: Dr. Everardo All  11/03/2020  - Visit   69 year old female never smoker followed in our office for asthma COPD overlap syndrome.  She is established with Dr. Everardo All.  Patient was last seen by Dr. Everardo All in October/2021.  Plan of care at that office visit was as follows: Continue ICS/LABA/LAMA inhaler, continue albuterol as needed We will order overnight oximetry study, if positive will need to consider sleep study, continue weekly allergy shots, continue Plaquenil for management of rheumatoid arthritis, follow-up in 6 months  Patient contacted our office on 09/30/2020 overnight oximetry that was completed on 08/06/2020 showed the patient spent 9.4 minutes below 88%, basal SPO2 was 93.4%.  Patient unfortunately was also hospitalized on 10/29/2020 at [facility].  She was discharged on 11/01/2020.  An excerpt of that discharge summary is listed below  PCP: Jerl Mina, MD  Admit date: 10/29/2020 Discharge date: 11/01/2020  Time spent: 35 minute  Recommendations for Outpatient Follow-up:  1. Follow-up with your pulmonologist within a week 2. Follow-up with your primary care provider in 1 to 2 weeks 3. Take your medications as prescribed  Discharge Diagnoses:      Active Hospital Problems   Diagnosis Date Noted  . Acute exacerbation of chronic obstructive pulmonary disease (COPD) (HCC) 10/29/2020  . AKI (acute kidney injury) (HCC) 10/29/2020  . Hypotension 10/29/2020  . HLD (hyperlipidemia) 02/29/2020  . Gastro-esophageal reflux disease without esophagitis  07/01/2015  . Rheumatoid arthritis involving multiple joints (HCC) 09/10/2014    Resolved Hospital Problems  No resolved problems to display.    Discharge Condition: Stable  Diet recommendation: Resume previous diet.      Vitals:   11/01/20 0815 11/01/20 1130  BP: (!) 158/88 (!) 153/81  Pulse: 96 76  Resp: 18 17  Temp: 98 F (36.7 C) 98.3 F (36.8 C)  SpO2: 96% 95%    History of present illness:  Misty P Jonesis a 69 y.o.femalewith medical history significant forhypertension, hyperlipidemia, type 2 diabetes mellitus, COPD, asthma, rheumatoid arthritis on Plaquenil,who is admitted to [facility] on 12/8/2021with complaints of shortness of breath, associated with nonproductive cough for 1 week duration.  Work-up revealed COPD/asthma exacerbation for which she received IV steroids, bronchodilators and Z-Pak.  11/01/20: Seen and examined at her bedside.  Vital signs and labs reviewed and are stable.  She still has a mild cough left.  Not hypoxic oxygen saturation 96% on room air.  Nontoxic-appearing.  Advised to continue with steroid taper and oral azithromycin.  She states she has an upcoming appointment with her pulmonologist's PA on Monday 11/03/20 and later with Dr. Everardo All.   Hospital Course:  Principal Problem:   Acute exacerbation of chronic obstructive pulmonary disease (COPD) (HCC) Active Problems:   Gastro-esophageal reflux disease without esophagitis   Rheumatoid arthritis involving multiple joints (HCC)   HLD (hyperlipidemia)   AKI (acute kidney injury) (HCC)   Hypotension  Improving acute COPD exacerbation/asthma exacerbation Personally reviewed chest x-ray done on 10/29/2020 no active disease noted. She was started on IV Solu-Medrol 40 mg every 6 hours and weaned off to prednisone  taper. Continue home bronchodilators Continue with p.o. azithromycin.  Resolving AKI likely prerenal in the setting of dehydration She presented  with creatinine of 2.1 with GFR of 25 Baseline creatinine 0.7 with GFR greater than 60 Continue to avoid nephrotoxins Telmisartan held due to AKI Follow-up with your PCP in 1 to 2 weeks. Obtain BMP on 11/06/20 at PCPs office.  Essential hypertension Continue home Norvasc 5 mg daily P.o. Lopressor 50 mg twice daily Resume home Lasix 20 mg daily along with potassium replacement Telmisartan held due to AKI, follow-up with your PCP in 1 to 2 weeks.  Chronic nocturnal hypoxia She self-reports she is on 1 L nasal cannula nightly, continue Currently she is saturating 96% on room air.  Resolved tachycardia Continue home Lopressor 50 mg twice daily  Chronic diastolic CHF Last 2D echo done on 01/23/2020 showed normal LVEF 55 to 60% No acute issues.  Rheumatoid arthritis Continue home Plaquenil  GERD/hyperlipidemia/polyneuropathy Continue home regimen  History of COVID-19 viral pneumonia Follows with Dr. Everardo All and has a follow up appointment on Monday 11/03/20 with PA.   Patient presented to her office today reporting she is doing better since being discharged to the hospital.  Still having some intermittent wheezing and chest tightness.  She is on a prednisone taper.  Patient reports adherence to ICS/LABA/LAMA.  She does have a history of COVID-19 infection in November/2020.  She was hospitalized at [facility].  She reports this for about 6 to 7 days.  See the discharge summary listed below:   History of present illness and  Hospital Course:     Kindly see H&P for history of present illness and admission details, please review complete Labs, Consult reports and Test reports for all details in brief  HPI  from the history and physical done on the day of admission 09/26/2019  Misty P Jonesis a 69 y.o.femalewith medical history significant ofasthma, htn, RA comes in with over a week of sob and abd pain. No vomiting or diarrhea. Has been coughing a lot. Tested positive  for covid. sats nml on RA but with bilatearl infiltrates on cxr. Referred for admission for covid pna.  Hospital Course   Acute hypoxic respiratory failure due to pneumonia due to COVID-19 infection. -With hypoxia, she was on 2 L nasal , has resolved, she is on room air, she was treated with IV Solu-Medrol during hospital stay for COVID-19 and COPD exacerbation, she will be discharged on prednisone taper, received 5 days of IV remdesivir, her inflammatory markers significantly trending down.  COVID-19 Labs  Recent Labs (last 2 labs)        Recent Labs   09/28/19 0200 09/29/19 0012 09/30/19 0329  DDIMER 0.45 0.33 0.43  CRP 9.6* 5.5* 2.4*      Recent Labs       Lab Results  Component Value Date   SARSCOV2NAA POSITIVE (A) 09/25/2019      Essential (primary) hypertension -Pressures of during hospital stay, she was dizzy and lightheaded, her medication has been held during hospital stay, even with that blood pressure was on the lower side, she did respond to fluids, meds will be held on discharge .  Rheumatoid arthritis involving multiple joints (HCC) -Stable with no acute issues during hospital stay  Diabetes mellitus -Resume home meds on discharge  COPD/asthma exacerbation -Patient with significant wheezing initially, she was treated with IV steroids, her wheezing has resolved today, she will be discharged on prednisone taper . -She is following with Dr. Meredeth Ide, she  is on prolonged prednisone tapered, patient tells me she was tapered to 10 mg oral daily with plan to DC soon, so I have instructed her to finish her prednisone taper , and resume her prednisone 10 mg daily .  Hyperkalemia -Potassium 5.2 on discharge, she received Kayexalate repeat is 4.6  CKD stage III -Seen at baseline, continue to monitor   Patient has not had a repeat pulmonary function test.   Questionaires / Pulmonary Flowsheets:   ACT:  Asthma Control Test ACT Total  Score  09/03/2020 15  07/24/2020 20  03/13/2020 15    MMRC: mMRC Dyspnea Scale mMRC Score  07/24/2020 0    Epworth:  No flowsheet data found.  Tests:   2020-CTA chest-scattered areas of mild groundglass attenuation are not typical for post Covid pneumonia changes in the lungs, findings are nonspecific and could be due to atelectasis, hypersensitivity pneumonitis  03/02/2020-CT chest with contrast-mild enlargement bilateral pleural effusions and increased dependent opacities in both lower lobes, scattered groundglass opacities elsewhere in the lungs change from recent chest CT  10/29/2020-chest x-ray-no acute process and chest  01/23/2020-echocardiogram-LV ejection fraction 55 to 60%, right ventricle systolic function is normal, mildly elevated pulmonary artery systolic pressure  FENO:  No results found for: NITRICOXIDE  PFT: No flowsheet data found.  WALK:  SIX MIN WALK 11/03/2020  Tech Comments: Average pace with no desats patient tolerate well.    Imaging: DG Chest 2 View  Result Date: 10/29/2020 CLINICAL DATA:  Shortness of breath EXAM: CHEST - 2 VIEW COMPARISON:  None. FINDINGS: The heart size and mediastinal contours are within normal limits. Mild chronic interstitial prominence. No new consolidation or edema. No pleural effusion or pneumothorax. No acute osseous abnormality. IMPRESSION: No acute process in the chest. Electronically Signed   By: Guadlupe Spanish M.D.   On: 10/29/2020 14:00   CT Renal Stone Study  Result Date: 10/29/2020 CLINICAL DATA:  Right-sided pain EXAM: CT ABDOMEN AND PELVIS WITHOUT CONTRAST TECHNIQUE: Multidetector CT imaging of the abdomen and pelvis was performed following the standard protocol without IV contrast. COMPARISON:  CT 03/02/2020 FINDINGS: Lower chest: Lung bases demonstrate linear scarring within the subpleural lower lobes. No acute consolidation or effusion. Normal cardiac size. Hepatobiliary: No focal liver abnormality is seen. No  gallstones, gallbladder wall thickening, or biliary dilatation. Pancreas: Unremarkable. No pancreatic ductal dilatation or surrounding inflammatory changes. Spleen: Normal in size without focal abnormality. Adrenals/Urinary Tract: Adrenal glands are within normal limits. Kidneys show no hydronephrosis. The urinary bladder is unremarkable Stomach/Bowel: Stomach is nonenlarged. No dilated small bowel. No bowel wall thickening. Nonvisualized appendix but no right lower quadrant inflammatory process. Vascular/Lymphatic: Mild aortic atherosclerosis without aneurysm. No suspicious nodes Reproductive: Uterus and bilateral adnexa are unremarkable. Other: Negative for free air or free fluid. Musculoskeletal: Diffuse degenerative changes throughout the lumbar spine. No acute or suspicious osseous abnormality. IMPRESSION: No CT evidence for acute intra-abdominal or pelvic abnormality. Aortic Atherosclerosis (ICD10-I70.0). Electronically Signed   By: Jasmine Pang M.D.   On: 10/29/2020 17:19    Lab Results:  CBC    Component Value Date/Time   WBC 3.3 (L) 10/30/2020 0506   RBC 3.97 10/30/2020 0506   HGB 11.5 (L) 10/30/2020 0506   HCT 34.5 (L) 10/30/2020 0506   PLT 228 10/30/2020 0506   MCV 86.9 10/30/2020 0506   MCH 29.0 10/30/2020 0506   MCHC 33.3 10/30/2020 0506   RDW 12.6 10/30/2020 0506   LYMPHSABS 2.1 07/24/2020 1501   MONOABS 0.7 07/24/2020 1501  EOSABS 0.4 07/24/2020 1501   BASOSABS 0.1 07/24/2020 1501    BMET    Component Value Date/Time   NA 138 10/30/2020 0506   K 4.2 10/30/2020 0506   CL 105 10/30/2020 0506   CO2 22 10/30/2020 0506   GLUCOSE 181 (H) 10/30/2020 0506   BUN 22 10/30/2020 0506   CREATININE 1.28 (H) 10/30/2020 0506   CALCIUM 9.1 10/30/2020 0506   GFRNONAA 46 (L) 10/30/2020 0506   GFRAA >60 03/06/2020 0343    BNP No results found for: BNP  ProBNP No results found for: PROBNP  Specialty Problems      Pulmonary Problems   Asthma-COPD overlap syndrome (HCC)    Acute exacerbation of chronic obstructive pulmonary disease (COPD) (HCC)   Chronic hypoxemic respiratory failure (HCC)      Allergies  Allergen Reactions  . Doxycycline Other (See Comments)    Thrush  . Penicillins Hives, Palpitations and Rash  . Azithromycin Other (See Comments)    Thrush  . Levofloxacin Other (See Comments)    Joint pain/muscle pain  . Sulfa Antibiotics Rash    Immunization History  Administered Date(s) Administered  . Fluad Quad(high Dose 65+) 07/20/2019, 08/19/2020  . Moderna Sars-Covid-2 Vaccination 06/18/2020, 07/14/2020  . Pneumococcal Conjugate-13 01/23/2019  . Zoster Recombinat (Shingrix) 04/13/2019, 06/27/2019    Past Medical History:  Diagnosis Date  . Arthritis   . Asthma   . COPD (chronic obstructive pulmonary disease) (HCC)    Dr. Meredeth Ide, [facility] pulmonologist  . Diabetes mellitus   . Fever of unknown origin 02/28/2020  . GERD (gastroesophageal reflux disease)   . Heart murmur   . HLD (hyperlipidemia)   . Hypertension   . Myocardial infarction (HCC)    mild  . Pneumonia due to COVID-19 virus 09/26/2019  . PONV (postoperative nausea and vomiting)    itching sometimes  . Rheumatoid arthritis(714.0)   . Shortness of breath     Tobacco History: Social History   Tobacco Use  Smoking Status Passive Smoke Exposure - Never Smoker  Smokeless Tobacco Never Used   Counseling given: Not Answered   Continue to not smoke  Outpatient Encounter Medications as of 11/03/2020  Medication Sig  . albuterol (PROVENTIL) (2.5 MG/3ML) 0.083% nebulizer solution Take 3 mLs (2.5 mg total) by nebulization every 6 (six) hours.  Marland Kitchen albuterol (VENTOLIN HFA) 108 (90 Base) MCG/ACT inhaler Inhale 1-2 puffs into the lungs every 6 (six) hours as needed for wheezing or shortness of breath.  Marland Kitchen amLODipine (NORVASC) 5 MG tablet Take 1 tablet (5 mg total) by mouth daily.  . Ascorbic Acid (VITAMIN C) 1000 MG tablet Take 1,000 mg by mouth daily.  Marland Kitchen  aspirin EC 81 MG tablet Take by mouth daily.   Marland Kitchen azithromycin (ZITHROMAX) 250 MG tablet Take 250 mg daily x 4 days  . beclomethasone (QVAR REDIHALER) 80 MCG/ACT inhaler 2 puffs  . benzonatate (TESSALON) 100 MG capsule Take 1 capsule (100 mg total) by mouth every 6 (six) hours as needed for cough.  . Budeson-Glycopyrrol-Formoterol (BREZTRI AEROSPHERE) 160-9-4.8 MCG/ACT AERO Inhale 2 puffs into the lungs in the morning and at bedtime.  . calcium carbonate (OS-CAL - DOSED IN MG OF ELEMENTAL CALCIUM) 1250 (500 Ca) MG tablet Take 2 tablets by mouth daily with breakfast.  . Coenzyme Q10 (CO Q-10 PO) Take by mouth daily.  Marland Kitchen EPINEPHrine 0.3 mg/0.3 mL IJ SOAJ injection Inject 0.3 mLs into the muscle once as needed for anaphylaxis.  . fluticasone (FLONASE) 50 MCG/ACT nasal  spray Place 1 spray into both nostrils daily as needed for allergies or rhinitis.  . folic acid (FOLVITE) 0.5 MG tablet   . furosemide (LASIX) 20 MG tablet Take 20 mg by mouth daily.   Marland Kitchen gabapentin (NEURONTIN) 100 MG capsule Take 1 capsule by mouth at bedtime.  . hydroxychloroquine (PLAQUENIL) 200 MG tablet Take 200 mg by mouth daily.   . hydrOXYzine (ATARAX/VISTARIL) 25 MG tablet Take 25 mg by mouth every 4 (four) hours as needed for anxiety or itching.  . levocetirizine (XYZAL) 5 MG tablet Take 5 mg by mouth every evening.  . lovastatin (MEVACOR) 20 MG tablet Take 20 mg by mouth at bedtime.  . magnesium gluconate (MAGONATE) 500 MG tablet Take 500 mg by mouth daily.  . metoprolol tartrate (LOPRESSOR) 50 MG tablet Take 50 mg by mouth 2 (two) times daily.  . montelukast (SINGULAIR) 10 MG tablet 1 tablet in the evening  . Multiple Vitamin (MULTIVITAMIN) capsule Take 1 capsule by mouth daily.  Marland Kitchen nystatin (MYCOSTATIN) 100000 UNIT/ML suspension Use as directed 5 mLs in the mouth or throat 4 (four) times daily. Twice daily  . omeprazole (PRILOSEC) 20 MG capsule Take 20 mg by mouth daily.  . potassium chloride (KLOR-CON) 10 MEQ tablet Take 10  mEq by mouth daily.  . predniSONE (DELTASONE) 5 MG tablet Take 8 tablets (40 mg total) by mouth daily with breakfast for 10 days. Take 40 mg daily x 2 days then, Take 30 mg daily x2 days then, Take 20 mg daily x2 days then, Take 10 mg daily x2 days then, Take 5 mg daily x2 days then stop  . vitamin B-12 (CYANOCOBALAMIN) 1000 MCG tablet Take 1,000 mcg by mouth daily.  . ferrous sulfate 325 (65 FE) MG tablet Take 1 tablet (325 mg total) by mouth 2 (two) times daily with a meal.  . metFORMIN (GLUCOPHAGE) 1000 MG tablet Take 1 tablet (1,000 mg total) by mouth 2 (two) times daily with a meal.  . Tiotropium Bromide-Olodaterol (STIOLTO RESPIMAT) 2.5-2.5 MCG/ACT AERS 2 puffs (Patient not taking: Reported on 11/03/2020)   No facility-administered encounter medications on file as of 11/03/2020.     Review of Systems  Review of Systems  Constitutional: Negative for activity change, fatigue and fever.  HENT: Positive for congestion and postnasal drip. Negative for sinus pressure, sinus pain and sore throat.   Respiratory: Positive for cough, shortness of breath and wheezing.   Cardiovascular: Negative for chest pain and palpitations.  Gastrointestinal: Negative for diarrhea, nausea and vomiting.  Musculoskeletal: Negative for arthralgias.  Neurological: Negative for dizziness.  Psychiatric/Behavioral: Negative for sleep disturbance. The patient is not nervous/anxious.      Physical Exam  BP 128/68 (BP Location: Left Arm, Cuff Size: Normal)   Pulse 68   SpO2 97%   Wt Readings from Last 5 Encounters:  10/31/20 159 lb 13.3 oz (72.5 kg)  09/03/20 165 lb 3.2 oz (74.9 kg)  07/24/20 168 lb 3.2 oz (76.3 kg)  04/22/20 166 lb (75.3 kg)  03/13/20 159 lb 12.8 oz (72.5 kg)    BMI Readings from Last 5 Encounters:  10/31/20 25.80 kg/m  09/03/20 26.66 kg/m  07/24/20 27.15 kg/m  04/22/20 27.62 kg/m  03/13/20 26.59 kg/m     Physical Exam Vitals and nursing note reviewed.  Constitutional:       General: She is not in acute distress.    Appearance: Normal appearance. She is normal weight.  HENT:     Head: Normocephalic and atraumatic.  Right Ear: External ear normal.     Left Ear: External ear normal.     Nose: Rhinorrhea present. No congestion.     Mouth/Throat:     Mouth: Mucous membranes are moist.     Pharynx: Oropharynx is clear.     Comments: +pnd  Eyes:     Pupils: Pupils are equal, round, and reactive to light.  Cardiovascular:     Rate and Rhythm: Normal rate and regular rhythm.     Pulses: Normal pulses.     Heart sounds: Normal heart sounds. No murmur heard.   Pulmonary:     Breath sounds: No decreased air movement. Wheezing present. No decreased breath sounds or rales.  Musculoskeletal:     Cervical back: Normal range of motion.  Skin:    General: Skin is warm and dry.     Capillary Refill: Capillary refill takes less than 2 seconds.  Neurological:     General: No focal deficit present.     Mental Status: She is alert and oriented to person, place, and time. Mental status is at baseline.     Gait: Gait normal.  Psychiatric:        Mood and Affect: Mood normal.        Behavior: Behavior normal.        Thought Content: Thought content normal.        Judgment: Judgment normal.       Assessment & Plan:   Asthma-COPD overlap syndrome (HCC) Plan:  Continue Breztri  Finish prednisone taper as outlined Walk today in office today We will order pulmonary function testing Keep 4-week follow-up with Dr. Everardo All  Chronic hypoxemic respiratory failure (HCC) Plan: Continue oxygen therapy 1 L of O2 at night  Rheumatoid arthritis involving multiple joints (HCC) Plan: Continue follow-up with rheumatology Continue Plaquenil  History of COVID-19 History of COVID-19 infection Hospitalized in November/2020 in [place] for 6 to 7 days Did not require mechanical ventilation Has received first 2 COVID-19 vaccinations  Plan: We will obtain  pulmonary function testing Walk today in office stable tiny oxygen desaturations Continue oxygen therapy 1 L of O2 at night    Return in about 4 weeks (around 12/01/2020), or if symptoms worsen or fail to improve, for Follow up with Dr. Everardo All, Follow up for FULL PFT - 60 min.   Coral Ceo, NP 11/03/2020   This appointment required 55 minutes of patient care (this includes precharting, chart review, review of results, face-to-face care, etc.).

## 2020-11-03 NOTE — Patient Instructions (Addendum)
You were seen today by Misty Ceo, NP  for:   1. Asthma-COPD overlap syndrome (HCC)  - Pulmonary function test; Future  Walk today in office stable  We will request records from primary care for your pneumonia vaccinations  Continue prednisone as outlined from hospital discharge  Breztri >>> 2 puffs in the morning right when you wake up, rinse out your mouth after use, 12 hours later 2 puffs, rinse after use >>> Take this daily, no matter what >>> This is not a rescue inhaler   Note your daily symptoms > remember "red flags" for COPD:   >>>Increase in cough >>>increase in sputum production >>>increase in shortness of breath or activity  intolerance.   If you notice these symptoms, please call the office to be seen.   We will obtain breathing test to further evaluate your lung functioning  We will request records from [facility] from your March/2019 breathing test  2. History of COVID-19  Walk today in office  We will obtain pulmonary function testing  3. Chronic hypoxemic respiratory failure (HCC)  Continue 1 L of O2 at night  Stable walk in office today without any oxygen desaturations  4. Rheumatoid arthritis involving multiple joints (HCC)  Continue Plaquenil  Continue follow-up with rheumatology   We recommend today:  Orders Placed This Encounter  Procedures  . Pulmonary function test    Standing Status:   Future    Standing Expiration Date:   11/03/2021    Order Specific Question:   Where should this test be performed?    Answer:   [facility]    Order Specific Question:   Full PFT: includes the following: basic spirometry, spirometry pre & post bronchodilator, diffusion capacity (DLCO), lung volumes    Answer:   Full PFT   Orders Placed This Encounter  Procedures  . Pulmonary function test   No orders of the defined types were placed in this encounter.   Follow Up:    Return in about 4 weeks (around 12/01/2020), or if symptoms  worsen or fail to improve, for Follow up with Dr. Everardo All, Follow up for FULL PFT - 60 min. Patient already scheduled for follow-up with Dr. Everardo All.  Keep this appointment.  Please also schedule patient in a 60-minute full pulmonary function testing slot at next available.  Notification of test results are managed in the following manner: If there are  any recommendations or changes to the  plan of care discussed in office today,  we will contact you and let you know what they are. If you do not hear from [place], then your results are normal and you can view them through your  MyChart account , or a letter will be sent to you. Thank you again for trusting [place] with your care  - Thank you, [facility]    It is flu season:   >>> Best ways to protect herself from the flu: Receive the yearly flu vaccine, practice good hand hygiene washing with soap and also using hand sanitizer when available, eat a nutritious meals, get adequate rest, hydrate appropriately       Please contact the office if your symptoms worsen or you have concerns that you are not improving.   Thank you for choosing [facility] for your healthcare, and for allowing [place] to partner with you on your healthcare journey. I am thankful to be able to provide care to you today.   Elisha Headland FNP-C

## 2020-11-03 NOTE — Assessment & Plan Note (Signed)
Plan: Continue follow-up with rheumatology Continue Plaquenil

## 2020-11-03 NOTE — Assessment & Plan Note (Signed)
Plan: Continue oxygen therapy 1 L of O2 at night

## 2020-11-03 NOTE — Assessment & Plan Note (Signed)
History of COVID-19 infection Hospitalized in November/2020 in [place] for 6 to 7 days Did not require mechanical ventilation Has received first 2 COVID-19 vaccinations  Plan: We will obtain pulmonary function testing Walk today in office stable tiny oxygen desaturations Continue oxygen therapy 1 L of O2 at night

## 2020-12-02 ENCOUNTER — Ambulatory Visit (INDEPENDENT_AMBULATORY_CARE_PROVIDER_SITE_OTHER): Payer: Medicare Other | Admitting: Pulmonary Disease

## 2020-12-02 ENCOUNTER — Other Ambulatory Visit: Payer: Self-pay

## 2020-12-02 ENCOUNTER — Encounter: Payer: Self-pay | Admitting: Pulmonary Disease

## 2020-12-02 ENCOUNTER — Ambulatory Visit: Payer: PRIVATE HEALTH INSURANCE | Admitting: Pulmonary Disease

## 2020-12-02 VITALS — BP 122/76 | HR 87 | Temp 98.2°F | Ht 64.0 in | Wt 165.0 lb

## 2020-12-02 DIAGNOSIS — J449 Chronic obstructive pulmonary disease, unspecified: Secondary | ICD-10-CM

## 2020-12-02 DIAGNOSIS — J9611 Chronic respiratory failure with hypoxia: Secondary | ICD-10-CM

## 2020-12-02 DIAGNOSIS — J4489 Other specified chronic obstructive pulmonary disease: Secondary | ICD-10-CM

## 2020-12-02 LAB — PULMONARY FUNCTION TEST
DL/VA % pred: 103 %
DL/VA: 4.25 ml/min/mmHg/L
DLCO cor % pred: 78 %
DLCO cor: 16.48 ml/min/mmHg
DLCO unc % pred: 73 %
DLCO unc: 15.43 ml/min/mmHg
FEF 25-75 Post: 2.44 L/sec
FEF 25-75 Pre: 2.19 L/sec
FEF2575-%Change-Post: 11 %
FEF2575-%Pred-Post: 115 %
FEF2575-%Pred-Pre: 103 %
FEV1-%Change-Post: 2 %
FEV1-%Pred-Post: 84 %
FEV1-%Pred-Pre: 81 %
FEV1-Post: 2.12 L
FEV1-Pre: 2.06 L
FEV1FVC-%Change-Post: 1 %
FEV1FVC-%Pred-Pre: 107 %
FEV6-%Change-Post: 1 %
FEV6-%Pred-Post: 80 %
FEV6-%Pred-Pre: 79 %
FEV6-Post: 2.56 L
FEV6-Pre: 2.51 L
FEV6FVC-%Pred-Post: 104 %
FEV6FVC-%Pred-Pre: 104 %
FVC-%Change-Post: 1 %
FVC-%Pred-Post: 77 %
FVC-%Pred-Pre: 75 %
FVC-Post: 2.56 L
FVC-Pre: 2.51 L
Post FEV1/FVC ratio: 83 %
Post FEV6/FVC ratio: 100 %
Pre FEV1/FVC ratio: 82 %
Pre FEV6/FVC Ratio: 100 %
RV % pred: 80 %
RV: 1.82 L
TLC % pred: 80 %
TLC: 4.3 L

## 2020-12-02 MED ORDER — NYSTATIN 100000 UNIT/ML MT SUSP: 5.0000 mL | Freq: Four times a day (QID) | OROMUCOSAL | 0 refills | Status: DC

## 2020-12-02 NOTE — Progress Notes (Signed)
Full PFT performed today. °

## 2020-12-02 NOTE — Patient Instructions (Addendum)
  Asthma-COPD --CONTINUE Breztri TWO puff TWICE a day --CONTINUE Albuterol as needed for shortness of breath or wheezing --Declined Pulmonary Rehab. 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

## 2020-12-02 NOTE — Progress Notes (Signed)
Subjective:   PATIENT ID: Misty Briggs GENDER: female DOB: 1951-05-08, MRN: [id number]   HPI  Chief Complaint  Patient presents with  . Follow-up    Had PFT today.  Requesting  refill of Nystatin    Reason for Visit: PFT follow-up  [name] is 70 year old female never smoker with asthma-COPD, rheumatoid arthritis and hx covid pneumonia 09/2019 who presents for follow-up.  Synopsis: She was previously seen in  Pulmonary in [place] by Dr. Jayme Cloud. Last visit on 05/12/20 was reviewed. She has tried multiple inhalers for her COPD and does not tolerate powder forms. She recently tried on [place] and feels this inhaler is doing really well for her.   Last seen by NP [facility] for hospital follow-up for COPD exacerbation. She had been hospitalized from 10/29/20-11/01/20. Since then she has returned to baseline and denies shortness of breath, cough or wheeze unless with heavy exertion. She does have thrush sometimes with inhaler use despite rinsing mouth out after inhaler use.  Social History: Husband has COPD  I have personally reviewed patient's past medical/family/social history/allergies/current medications.  Past Medical History:  Diagnosis Date  . Arthritis   . Asthma   . COPD (chronic obstructive pulmonary disease) (HCC)    Dr. Meredeth Ide, [facility] pulmonologist  . Diabetes mellitus   . Fever of unknown origin 02/28/2020  . GERD (gastroesophageal reflux disease)   . Heart murmur   . HLD (hyperlipidemia)   . Hypertension   . Myocardial infarction (HCC)    mild  . Pneumonia due to COVID-19 virus 09/26/2019  . PONV (postoperative nausea and vomiting)    itching sometimes  . Rheumatoid arthritis(714.0)   . Shortness of breath      Allergies  Allergen Reactions  . Doxycycline Other (See Comments)    Thrush  . Penicillins Hives, Palpitations and Rash  . Azithromycin Other (See Comments)    Thrush  . Levofloxacin Other (See Comments)    Joint  pain/muscle pain  . Sulfa Antibiotics Rash     Outpatient Medications Prior to Visit  Medication Sig Dispense Refill  . albuterol (PROVENTIL) (2.5 MG/3ML) 0.083% nebulizer solution Take 3 mLs (2.5 mg total) by nebulization every 6 (six) hours. 360 mL 2  . albuterol (VENTOLIN HFA) 108 (90 Base) MCG/ACT inhaler Inhale 1-2 puffs into the lungs every 6 (six) hours as needed for wheezing or shortness of breath.    Marland Kitchen amLODipine (NORVASC) 5 MG tablet Take 1 tablet (5 mg total) by mouth daily. 90 tablet 0  . Ascorbic Acid (VITAMIN C) 1000 MG tablet Take 1,000 mg by mouth daily.    Marland Kitchen aspirin EC 81 MG tablet Take by mouth daily.     . benzonatate (TESSALON) 100 MG capsule Take 1 capsule (100 mg total) by mouth every 6 (six) hours as needed for cough. 60 capsule 3  . Budeson-Glycopyrrol-Formoterol (BREZTRI AEROSPHERE) 160-9-4.8 MCG/ACT AERO Inhale 2 puffs into the lungs in the morning and at bedtime. 5.9 g 0  . calcium carbonate (OS-CAL - DOSED IN MG OF ELEMENTAL CALCIUM) 1250 (500 Ca) MG tablet Take 2 tablets by mouth daily with breakfast.    . Coenzyme Q10 (CO Q-10 PO) Take by mouth daily.    Marland Kitchen EPINEPHrine 0.3 mg/0.3 mL IJ SOAJ injection Inject 0.3 mLs into the muscle once as needed for anaphylaxis.    . fluticasone (FLONASE) 50 MCG/ACT nasal spray Place 1 spray into both nostrils daily as needed for allergies or rhinitis.    Marland Kitchen  folic acid (FOLVITE) 0.5 MG tablet     . furosemide (LASIX) 20 MG tablet Take 20 mg by mouth daily.     Marland Kitchen gabapentin (NEURONTIN) 100 MG capsule Take 1 capsule by mouth at bedtime.    . hydroxychloroquine (PLAQUENIL) 200 MG tablet Alternate daily with 1 tablet then the next day take 2 tablets    . hydrOXYzine (ATARAX/VISTARIL) 25 MG tablet Take 25 mg by mouth every 4 (four) hours as needed for anxiety or itching.    . levocetirizine (XYZAL) 5 MG tablet Take 5 mg by mouth every evening.    . lovastatin (MEVACOR) 20 MG tablet Take 20 mg by mouth at bedtime.    . magnesium  gluconate (MAGONATE) 500 MG tablet Take 500 mg by mouth daily.    . metoprolol tartrate (LOPRESSOR) 50 MG tablet Take 50 mg by mouth 2 (two) times daily.    . montelukast (SINGULAIR) 10 MG tablet 1 tablet in the evening    . Multiple Vitamin (MULTIVITAMIN) capsule Take 1 capsule by mouth daily.    Marland Kitchen nystatin (MYCOSTATIN) 100000 UNIT/ML suspension Use as directed 5 mLs in the mouth or throat 4 (four) times daily. Twice daily    . omeprazole (PRILOSEC) 20 MG capsule Take 20 mg by mouth daily.    . potassium chloride (KLOR-CON) 10 MEQ tablet Take 10 mEq by mouth daily.    . vitamin B-12 (CYANOCOBALAMIN) 1000 MCG tablet Take 1,000 mcg by mouth daily.    Marland Kitchen azithromycin (ZITHROMAX) 250 MG tablet Take 250 mg daily x 4 days (Patient not taking: Reported on 12/02/2020) 4 each 0  . beclomethasone (QVAR REDIHALER) 80 MCG/ACT inhaler 2 puffs (Patient not taking: Reported on 12/02/2020)    . ferrous sulfate 325 (65 FE) MG tablet Take 1 tablet (325 mg total) by mouth 2 (two) times daily with a meal. 60 tablet 0  . metFORMIN (GLUCOPHAGE) 1000 MG tablet Take 1 tablet (1,000 mg total) by mouth 2 (two) times daily with a meal. 60 tablet 0  . Tiotropium Bromide-Olodaterol (STIOLTO RESPIMAT) 2.5-2.5 MCG/ACT AERS 2 puffs (Patient not taking: No sig reported)     No facility-administered medications prior to visit.    Review of Systems  Constitutional: Negative for chills, diaphoresis, fever, malaise/fatigue and weight loss.  HENT: Negative for congestion.   Respiratory: Negative for cough, hemoptysis, sputum production, shortness of breath and wheezing.   Cardiovascular: Negative for chest pain, palpitations and leg swelling.    Objective:   Vitals:   12/02/20 1334  BP: 122/76  Pulse: 87  Temp: 98.2 F (36.8 C)  TempSrc: Tympanic  SpO2: 98%  Weight: 165 lb (74.8 kg)  Height: 5\' 4"  (1.626 m)   SpO2: 98 %  Physical Exam: General: Well-appearing, no acute distress HENT: [place], AT, OP clear, MMM Eyes:  EOMI, no scleral icterus Respiratory: Clear to auscultation bilaterally.  No crackles, wheezing or rales Cardiovascular: RRR, -M/R/G, no JVD Extremities:-Edema,-tenderness Neuro: AAO x4, CNII-XII grossly intact Skin: Intact, no rashes or bruising Psych: Normal mood, normal affect  Data Reviewed:  Imaging: CT Chest 03/02/20 - Subsegemental atelectasis. Bilateral pleural effusions. Unchanged ground glass opacities.  CXR 10/29/20 - Chronic interstitial prominance  PFT: 12/03/19 FVC 2.56 (77%) FEV1 2.12 (84%) Ratio 82  TLC 80% DLCO 73% Interpretation: No obstructive or restrictive defect is present. No significant bronchodilator response. Isolated reduced DLCO may be suggestive of early ILD or PVD  09/30/2020 overnight oximetry that was completed on 08/06/2020 showed the patient spent 9.4 minutes  below 88%, basal SPO2 was 93.4%.  Imaging, labs and test noted above have been reviewed independently by me.  Assessment & Plan:   Discussion: 70 year old female with asthma-COPD overlap and allergic rhinitis who presents for follow-up. Last exacerbation in 10/2020 requiring hospitalization. Currently symptoms are well-controlled on triple therapy. Does have nocturnal hypoxemia that may be related to ?ILD v fibrosis from recurrent pneumonias. No sleep study on record. Continue supplemental O2 and re-evaluate at next visit.  Asthma-COPD --CONTINUE Breztri TWO puff TWICE a day --CONTINUE Albuterol as needed for shortness of breath or wheezing --Declined Pulmonary Rehab. 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 Fluad Quad(high Dose 65+) 07/20/2019, 08/19/2020  . Influenza, High Dose Seasonal PF 12/16/2016, 08/30/2017, 09/11/2019, 02/07/2020  . Influenza-Unspecified 08/22/2020  . Moderna Sars-Covid-2 Vaccination 06/18/2020, 07/14/2020  . Pneumococcal Conjugate-13 01/23/2019  . Pneumococcal Polysaccharide-23 12/16/2016, 08/30/2017, 08/24/2018, 09/11/2019, 02/07/2020, 09/25/2020  . Zoster Recombinat (Shingrix) 04/13/2019, 06/27/2019   CT Lung Screen - not qualified  No orders of the defined types were placed in this encounter.  Meds ordered this encounter  Medications  . nystatin (MYCOSTATIN) 100000 UNIT/ML suspension    Sig: Take 5 mLs (500,000 Units total) by mouth 4 (four) times daily.    Dispense:  60 mL    Refill:  0    Return in about 5 months (around 05/02/2021).  I have spent a total time of 31-minutes on the day of the appointment reviewing prior documentation, coordinating care and discussing medical diagnosis and plan with the patient/family. Imaging, labs and tests included in this note have been reviewed and interpreted independently by me.  Teigan Sahli Mechele Collin, MD [facility] 12/02/2020 1:43 PM  Office Number [phone]

## 2020-12-18 ENCOUNTER — Encounter: Payer: Self-pay | Admitting: Pulmonary Disease

## 2020-12-25 ENCOUNTER — Telehealth: Payer: Self-pay | Admitting: Pulmonary Disease

## 2020-12-25 NOTE — Telephone Encounter (Signed)
Attempted to call [employer] in regards to Rx for [facility] but was on hold for 10 min and never was able to speak to a rep. Will try to call back later.

## 2020-12-31 NOTE — Telephone Encounter (Signed)
Called [employer], the phone rang and no one answered. Will call back one more time before closing encounter.

## 2021-01-13 MED ORDER — BREZTRI AEROSPHERE 160-9-4.8 MCG/ACT IN AERO: 2.0000 | INHALATION_SPRAY | Freq: Two times a day (BID) | RESPIRATORY_TRACT | 3 refills | Status: DC

## 2021-01-13 NOTE — Telephone Encounter (Signed)
Called and spoke to pt. Pt states she received a call 2 weeks ago from [employer] stating she needs a new script for her Breztri. Pt is requesting we fax this to [employer].   Will forward to triage to fax this to [employer].

## 2021-01-13 NOTE — Telephone Encounter (Signed)
Rx has been printed and faxed to [employer]. Nothing further needed.

## 2021-03-10 ENCOUNTER — Encounter: Payer: Self-pay | Admitting: Pulmonary Disease

## 2021-03-26 ENCOUNTER — Other Ambulatory Visit: Payer: Self-pay | Admitting: Obstetrics and Gynecology

## 2021-03-26 DIAGNOSIS — Z1231 Encounter for screening mammogram for malignant neoplasm of breast: Secondary | ICD-10-CM

## 2021-04-02 ENCOUNTER — Ambulatory Visit
Admission: RE | Admit: 2021-04-02 | Discharge: 2021-04-02 | Disposition: A | Discharge status: Home / Self Care | Payer: Medicare Other | Source: Ambulatory Visit | Attending: Obstetrics and Gynecology | Admitting: Obstetrics and Gynecology

## 2021-04-02 ENCOUNTER — Other Ambulatory Visit: Payer: Self-pay

## 2021-04-02 DIAGNOSIS — Z1231 Encounter for screening mammogram for malignant neoplasm of breast: Secondary | ICD-10-CM | POA: Diagnosis not present

## 2021-07-14 ENCOUNTER — Other Ambulatory Visit: Payer: Self-pay

## 2021-07-14 ENCOUNTER — Ambulatory Visit (INDEPENDENT_AMBULATORY_CARE_PROVIDER_SITE_OTHER): Payer: Medicare Other | Admitting: Pulmonary Disease

## 2021-07-14 VITALS — BP 122/82 | HR 82 | Temp 98.1°F | Ht 66.0 in | Wt 155.8 lb

## 2021-07-14 DIAGNOSIS — J449 Chronic obstructive pulmonary disease, unspecified: Secondary | ICD-10-CM

## 2021-07-14 DIAGNOSIS — J4540 Moderate persistent asthma, uncomplicated: Secondary | ICD-10-CM

## 2021-07-14 DIAGNOSIS — J9611 Chronic respiratory failure with hypoxia: Secondary | ICD-10-CM

## 2021-07-14 DIAGNOSIS — J309 Allergic rhinitis, unspecified: Secondary | ICD-10-CM

## 2021-07-14 DIAGNOSIS — J4489 Other specified chronic obstructive pulmonary disease: Secondary | ICD-10-CM

## 2021-07-14 NOTE — Patient Instructions (Addendum)
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

## 2021-07-14 NOTE — Progress Notes (Signed)
Subjective:   PATIENT ID: Misty Briggs GENDER: female DOB: 04-19-1951, MRN: [id number]   HPI  Chief Complaint  Patient presents with   Follow-up    Follow up COPD. Reports her breathing has been much better. No concerns voiced.     Reason for Visit: PFT follow-up  Misty Briggs is 70 year old female never smoker with asthma-COPD, rheumatoid arthritis and hx covid pneumonia 09/2019 who presents for follow-up.  Synopsis: She was previously seen in [facility] in [place] by Dr. Jayme Cloud. She has tried multiple inhalers for her COPD and does not tolerate powder forms. She recently tried on [place] and feels this inhaler is doing really well for her.  Last COPD exacerbation was 10/2020  Since our last visit in 11/2020, she has had chronic left knee and ankle pain related to sciatica pain. Denies limitations in regular activity. She needed steroids and antibiotics from her podiatrist but nothing needed from a respiratory standpoint. She is pleased with her Breztri. Does not need albuterol or nebulizers.  Her last COPD exacerbation was in 10/2020 requiring hospitalization.  Denies shortness of breath, coughing or wheezing   Social History: Husband has COPD   Past Medical History:  Diagnosis Date   Arthritis    Asthma    COPD (chronic obstructive pulmonary disease) (HCC)    Dr. Meredeth Ide, [facility] pulmonologist   Diabetes mellitus    Fever of unknown origin 02/28/2020   GERD (gastroesophageal reflux disease)    Heart murmur    HLD (hyperlipidemia)    Hypertension    Myocardial infarction (HCC)    mild   Pneumonia due to COVID-19 virus 09/26/2019   PONV (postoperative nausea and vomiting)    itching sometimes   Rheumatoid arthritis(714.0)    Shortness of breath      Allergies  Allergen Reactions   Doxycycline Other (See Comments)    Thrush   Penicillins Hives, Palpitations and Rash   Azithromycin Other (See Comments)    Thrush   Levofloxacin  Other (See Comments)    Joint pain/muscle pain   Sulfa Antibiotics Rash     Outpatient Medications Prior to Visit  Medication Sig Dispense Refill   albuterol (VENTOLIN HFA) 108 (90 Base) MCG/ACT inhaler Inhale 1-2 puffs into the lungs every 6 (six) hours as needed for wheezing or shortness of breath.     amLODipine (NORVASC) 5 MG tablet Take 1 tablet (5 mg total) by mouth daily. 90 tablet 0   Ascorbic Acid (VITAMIN C) 1000 MG tablet Take 1,000 mg by mouth daily.     aspirin EC 81 MG tablet Take by mouth daily.      beclomethasone (QVAR REDIHALER) 80 MCG/ACT inhaler      benzonatate (TESSALON) 100 MG capsule Take 1 capsule (100 mg total) by mouth every 6 (six) hours as needed for cough. 60 capsule 3   Budeson-Glycopyrrol-Formoterol (BREZTRI AEROSPHERE) 160-9-4.8 MCG/ACT AERO Inhale 2 puffs into the lungs in the morning and at bedtime. 32.1 g 3   calcium carbonate (OS-CAL - DOSED IN MG OF ELEMENTAL CALCIUM) 1250 (500 Ca) MG tablet Take 2 tablets by mouth daily with breakfast.     Coenzyme Q10 (CO Q-10 PO) Take by mouth daily.     EPINEPHrine 0.3 mg/0.3 mL IJ SOAJ injection Inject 0.3 mLs into the muscle once as needed for anaphylaxis.     fluticasone (FLONASE) 50 MCG/ACT nasal spray Place 1 spray into both nostrils daily as needed for allergies or rhinitis.  furosemide (LASIX) 20 MG tablet Take 20 mg by mouth daily.      gabapentin (NEURONTIN) 100 MG capsule Take 1 capsule by mouth at bedtime.     hydroxychloroquine (PLAQUENIL) 200 MG tablet Alternate daily with 1 tablet then the next day take 2 tablets     hydrOXYzine (ATARAX/VISTARIL) 25 MG tablet Take 25 mg by mouth every 4 (four) hours as needed for anxiety or itching.     levocetirizine (XYZAL) 5 MG tablet Take 5 mg by mouth every evening.     lovastatin (MEVACOR) 20 MG tablet Take 20 mg by mouth at bedtime.     magnesium gluconate (MAGONATE) 500 MG tablet Take 500 mg by mouth daily.     metoprolol tartrate (LOPRESSOR) 50 MG tablet  Take 50 mg by mouth 2 (two) times daily.     montelukast (SINGULAIR) 10 MG tablet 1 tablet in the evening     Multiple Vitamin (MULTIVITAMIN) capsule Take 1 capsule by mouth daily.     nystatin (MYCOSTATIN) 100000 UNIT/ML suspension Use as directed 5 mLs in the mouth or throat 4 (four) times daily. Twice daily     nystatin (MYCOSTATIN) 100000 UNIT/ML suspension Take 5 mLs (500,000 Units total) by mouth 4 (four) times daily. 60 mL 0   omeprazole (PRILOSEC) 20 MG capsule Take 20 mg by mouth daily.     potassium chloride (KLOR-CON) 10 MEQ tablet Take 10 mEq by mouth daily.     vitamin B-12 (CYANOCOBALAMIN) 1000 MCG tablet Take 1,000 mcg by mouth daily.     albuterol (PROVENTIL) (2.5 MG/3ML) 0.083% nebulizer solution Take 3 mLs (2.5 mg total) by nebulization every 6 (six) hours. (Patient not taking: Reported on 07/14/2021) 360 mL 2   ferrous sulfate 325 (65 FE) MG tablet Take 1 tablet (325 mg total) by mouth 2 (two) times daily with a meal. 60 tablet 0   metFORMIN (GLUCOPHAGE) 1000 MG tablet Take 1 tablet (1,000 mg total) by mouth 2 (two) times daily with a meal. 60 tablet 0   azithromycin (ZITHROMAX) 250 MG tablet Take 250 mg daily x 4 days (Patient not taking: No sig reported) 4 each 0   folic acid (FOLVITE) 0.5 MG tablet  (Patient not taking: Reported on 07/14/2021)     Tiotropium Bromide-Olodaterol (STIOLTO RESPIMAT) 2.5-2.5 MCG/ACT AERS 2 puffs (Patient not taking: No sig reported)     No facility-administered medications prior to visit.    Review of Systems  Constitutional:  Negative for chills, diaphoresis, fever, malaise/fatigue and weight loss.  HENT:  Negative for congestion.   Respiratory:  Negative for cough, hemoptysis, sputum production, shortness of breath and wheezing.   Cardiovascular:  Negative for chest pain, palpitations and leg swelling.   Objective:   Vitals:   07/14/21 1117  BP: 122/82  Pulse: 82  Temp: 98.1 F (36.7 C)  TempSrc: Oral  SpO2: 97%  Weight: 155 lb 12.8  oz (70.7 kg)  Height: 5\' 6"  (1.676 m)   SpO2: 97 %  Physical Exam: General: Well-appearing, no acute distress HENT: [place], AT Eyes: EOMI, no scleral icterus Respiratory: Clear to auscultation bilaterally.  No crackles, wheezing or rales Cardiovascular: RRR, -M/R/G, no JVD Extremities:-Edema,-tenderness Neuro: AAO x4, CNII-XII grossly intact Psych: Normal mood, normal affect  Data Reviewed:  Imaging: CT Chest 03/02/20 - Subsegemental atelectasis. Bilateral pleural effusions. Unchanged ground glass opacities.  CXR 10/29/20 - Chronic interstitial prominance  PFT: 12/03/19 FVC 2.56 (77%) FEV1 2.12 (84%) Ratio 82  TLC 80% DLCO 73% Interpretation: No  obstructive or restrictive defect is present. No significant bronchodilator response. Isolated reduced DLCO may be suggestive of early ILD or PVD  09/30/2020 overnight oximetry that was completed on 08/06/2020 showed the patient spent 9.4 minutes below 88%, basal SPO2 was 93.4%.  Imaging, labs and test noted above have been reviewed independently by me.  Assessment & Plan:   Discussion: 70 year old female with asthma-COPD overlap and allergic rhinitis who presents for follow-up.  Last exacerbation 10/2020 required hospitalization.  No exacerbations since then.  Well-controlled on triple therapy.  We discussed her nocturnal hypoxemia which could be related from prior pneumonia versus undiagnosed sleep apnea.  We will reevaluate with overnight oximetry to see if she continues to need oxygen.  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 --Continue supplmental oxygen for 1L nightly --ORDER overnight oximetry  --DME: [facility] [phone]   Allergy --Weekly allergy shots  Rheumatoid Arthritis Previously on  methotrexate --Plaquenil  Osteoporosis --Recommend minimizing steroid use if able  Health Maintenance Immunization History  Administered Date(s) Administered   Fluad Quad(high Dose 65+) 07/20/2019, 08/19/2020   Influenza, High Dose Seasonal PF 12/16/2016, 08/30/2017, 09/11/2019, 02/07/2020   Influenza-Unspecified 08/22/2020   Moderna Sars-Covid-2 Vaccination 06/18/2020, 07/14/2020   Pneumococcal Conjugate-13 01/23/2019   Pneumococcal Polysaccharide-23 12/16/2016, 08/30/2017, 08/24/2018, 09/11/2019, 02/07/2020, 09/25/2020   Zoster Recombinat (Shingrix) 04/13/2019, 06/27/2019   CT Lung Screen - not qualified  No orders of the defined types were placed in this encounter.  No orders of the defined types were placed in this encounter.   Return in about 6 months (around 01/14/2022).  I have spent a total time of 36-minutes on the day of the appointment reviewing prior documentation, coordinating care and discussing medical diagnosis and plan with the patient/family. Past medical history, allergies, medications were reviewed. Pertinent imaging, labs and tests included in this note have been reviewed and interpreted independently by me.  Cataleah Stites Mechele Collin, MD [facility] 07/14/2021 11:23 AM  Office Number [phone]

## 2021-07-15 ENCOUNTER — Encounter: Payer: Self-pay | Admitting: Pulmonary Disease

## 2021-07-15 DIAGNOSIS — J4540 Moderate persistent asthma, uncomplicated: Secondary | ICD-10-CM | POA: Insufficient documentation

## 2021-09-07 ENCOUNTER — Telehealth: Payer: Self-pay | Admitting: Pulmonary Disease

## 2021-09-07 MED ORDER — PREDNISONE 10 MG PO TABS: ORAL_TABLET | ORAL | 0 refills | Status: DC

## 2021-09-07 NOTE — Telephone Encounter (Signed)
Called and spoke with patient. She stated that she developed a cough and fever on 09/04/21. She woke up on Saturday and noticed that she was wheezing more. She stated that she does have a productive cough and the phlegm has been thick and clear. She has some nasal discharge and it has been clear as well.   She has not had a fever since Saturday morning. She took an at home covid test on Saturday which came back negative.   She has been using her Breztri inhaler as prescribed. She has the albuterol HFA and nebulizer on hand but has not had to use these due to not having any increased SOB. She does have some chest tightness.   I did attempt to see if I could get her scheduled for a video visit with one of the apps but the time for all of their openings have already passed.   Pharmacy is [facility] in [place].   Dr. Everardo All, can you please advise? Thanks!

## 2021-09-07 NOTE — Telephone Encounter (Signed)
Per JE, send in prednisone 40mg  x2 days, 30mg  x2days, 20mg  x2days, 10mg  x2days then stop. Also get patient scheduled for a follow up for the next week to check on her breathing status.   Called and spoke with patient, she is aware of JE's recommendations. Will go ahead and send in the prednisone. I was also able to get her scheduled with TP on 09/15/21 at 1130am.   Nothing further needed at time of call.

## 2021-09-15 ENCOUNTER — Ambulatory Visit (INDEPENDENT_AMBULATORY_CARE_PROVIDER_SITE_OTHER): Payer: Medicare Other | Admitting: Adult Health

## 2021-09-15 ENCOUNTER — Encounter: Payer: Self-pay | Admitting: Adult Health

## 2021-09-15 ENCOUNTER — Other Ambulatory Visit: Payer: Self-pay

## 2021-09-15 ENCOUNTER — Ambulatory Visit (INDEPENDENT_AMBULATORY_CARE_PROVIDER_SITE_OTHER): Payer: Medicare Other

## 2021-09-15 VITALS — BP 126/70 | HR 68 | Temp 98.3°F | Ht 64.0 in | Wt 149.8 lb

## 2021-09-15 DIAGNOSIS — J4540 Moderate persistent asthma, uncomplicated: Secondary | ICD-10-CM | POA: Diagnosis not present

## 2021-09-15 DIAGNOSIS — J449 Chronic obstructive pulmonary disease, unspecified: Secondary | ICD-10-CM | POA: Diagnosis not present

## 2021-09-15 MED ORDER — PREDNISONE 20 MG PO TABS: ORAL_TABLET | ORAL | 0 refills | Status: DC

## 2021-09-15 MED ORDER — AZITHROMYCIN 250 MG PO TABS: ORAL_TABLET | ORAL | 0 refills | Status: AC

## 2021-09-15 NOTE — Progress Notes (Signed)
@Patient  ID: , female    DOB: 1951/05/21, 70 y.o.   MRN: [id number]  Chief Complaint  Patient presents with   Follow-up    Referring provider: [id number], MD  HPI: 70 year old female never smoker with asthma and COPD. Medical history significant for rheumatoid arthritis.  History of COVID-pneumonia November 2020.  TEST/EVENTS :  CT Chest 03/02/20 - Subsegemental atelectasis. Bilateral pleural effusions. Unchanged ground glass opacities.  CXR 10/29/20 - Chronic interstitial prominance   PFT: 12/03/19 FVC 2.56 (77%) FEV1 2.12 (84%) Ratio 82  TLC 80% DLCO 73% Interpretation: No obstructive or restrictive defect is present. No significant bronchodilator response. Isolated reduced DLCO may be suggestive of early ILD or PVD   09/30/2020 overnight oximetry that was completed on 08/06/2020 showed the patient spent 9.4 minutes below 88%, basal SPO2 was 93.4%.  09/15/2021 Follow up ; COPD  Patient presents for an acute office visit.  Patient complains over the last week that she has had increased cough congestion wheezing and shortness of breath.  She is also had low-grade fever.  She was called in a prednisone taper initially.  But continues to have ongoing cough and congestion.She remains on Breztri inhaler twice daily. She is on Plaquenil for rheumatoid arthritis. COVID test earlier this week was negative. Had Covid 19 infection 3 separate times.  Appetite is good with no nv//d.       Allergies  Allergen Reactions   Doxycycline Other (See Comments)    Thrush   Penicillins Hives, Palpitations and Rash   Azithromycin Other (See Comments)    Thrush   Levofloxacin Other (See Comments)    Joint pain/muscle pain   Sulfa Antibiotics Rash    Immunization History  Administered Date(s) Administered   Fluad Quad(high Dose 65+) 07/20/2019, 08/19/2020   Influenza, High Dose Seasonal PF 12/16/2016, 08/30/2017, 09/11/2019, 02/07/2020   Influenza-Unspecified 08/22/2020    Moderna Sars-Covid-2 Vaccination 06/18/2020, 07/14/2020   Pneumococcal Conjugate-13 01/23/2019   Pneumococcal Polysaccharide-23 12/16/2016, 08/30/2017, 08/24/2018, 09/11/2019, 02/07/2020, 09/25/2020   Zoster Recombinat (Shingrix) 04/13/2019, 06/27/2019    Past Medical History:  Diagnosis Date   Arthritis    Asthma    COPD (chronic obstructive pulmonary disease) (HCC)    Dr. 08/27/2019, [facility] pulmonologist   Diabetes mellitus    Fever of unknown origin 02/28/2020   GERD (gastroesophageal reflux disease)    Heart murmur    HLD (hyperlipidemia)    Hypertension    Myocardial infarction (HCC)    mild   Pneumonia due to COVID-19 virus 09/26/2019   PONV (postoperative nausea and vomiting)    itching sometimes   Rheumatoid arthritis(714.0)    Shortness of breath     Tobacco History: Social History   Tobacco Use  Smoking Status Never   Passive exposure: Yes  Smokeless Tobacco Never   Counseling given: Not Answered   Outpatient Medications Prior to Visit  Medication Sig Dispense Refill   albuterol (PROVENTIL) (2.5 MG/3ML) 0.083% nebulizer solution Take 3 mLs (2.5 mg total) by nebulization every 6 (six) hours. 360 mL 2   albuterol (VENTOLIN HFA) 108 (90 Base) MCG/ACT inhaler Inhale 1-2 puffs into the lungs every 6 (six) hours as needed for wheezing or shortness of breath.     Ascorbic Acid (VITAMIN C) 1000 MG tablet Take 1,000 mg by mouth daily.     aspirin EC 81 MG tablet Take by mouth daily.      benzonatate (TESSALON) 100 MG capsule Take 1 capsule (100 mg total) by  mouth every 6 (six) hours as needed for cough. 60 capsule 3   Budeson-Glycopyrrol-Formoterol (BREZTRI AEROSPHERE) 160-9-4.8 MCG/ACT AERO Inhale 2 puffs into the lungs in the morning and at bedtime. 32.1 g 3   calcium carbonate (OS-CAL - DOSED IN MG OF ELEMENTAL CALCIUM) 1250 (500 Ca) MG tablet Take 2 tablets by mouth daily with breakfast.     Coenzyme Q10 (CO Q-10 PO) Take by mouth daily.      EPINEPHrine 0.3 mg/0.3 mL IJ SOAJ injection Inject 0.3 mLs into the muscle once as needed for anaphylaxis.     fluticasone (FLONASE) 50 MCG/ACT nasal spray Place 1 spray into both nostrils daily as needed for allergies or rhinitis.     furosemide (LASIX) 20 MG tablet Take 20 mg by mouth daily.      gabapentin (NEURONTIN) 100 MG capsule Take 1 capsule by mouth at bedtime.     hydroxychloroquine (PLAQUENIL) 200 MG tablet Alternate daily with 1 tablet then the next day take 2 tablets     hydrOXYzine (ATARAX/VISTARIL) 25 MG tablet Take 25 mg by mouth every 4 (four) hours as needed for anxiety or itching.     levocetirizine (XYZAL) 5 MG tablet Take 5 mg by mouth every evening.     lovastatin (MEVACOR) 20 MG tablet Take 20 mg by mouth at bedtime.     magnesium gluconate (MAGONATE) 500 MG tablet Take 500 mg by mouth daily.     metoprolol tartrate (LOPRESSOR) 50 MG tablet Take 50 mg by mouth 2 (two) times daily.     montelukast (SINGULAIR) 10 MG tablet 1 tablet in the evening     Multiple Vitamin (MULTIVITAMIN) capsule Take 1 capsule by mouth daily.     nystatin (MYCOSTATIN) 100000 UNIT/ML suspension Use as directed 5 mLs in the mouth or throat 4 (four) times daily. Twice daily     nystatin (MYCOSTATIN) 100000 UNIT/ML suspension Take 5 mLs (500,000 Units total) by mouth 4 (four) times daily. 60 mL 0   omeprazole (PRILOSEC) 20 MG capsule Take 20 mg by mouth daily.     potassium chloride (KLOR-CON) 10 MEQ tablet Take 10 mEq by mouth daily.     vitamin B-12 (CYANOCOBALAMIN) 1000 MCG tablet Take 1,000 mcg by mouth daily.     amLODipine (NORVASC) 5 MG tablet Take 1 tablet (5 mg total) by mouth daily. 90 tablet 0   ferrous sulfate 325 (65 FE) MG tablet Take 1 tablet (325 mg total) by mouth 2 (two) times daily with a meal. 60 tablet 0   metFORMIN (GLUCOPHAGE) 1000 MG tablet Take 1 tablet (1,000 mg total) by mouth 2 (two) times daily with a meal. 60 tablet 0   predniSONE (DELTASONE) 10 MG tablet Take 4 tabs for  2 days, then 3 tabs for 2 days, 2 tabs for 2 days, then 1 tab for 2 days, then stop. (Patient not taking: Reported on 09/15/2021) 20 tablet 0   No facility-administered medications prior to visit.     Review of Systems:   Constitutional:   No  weight loss, night sweats,  Fevers, chills,  +fatigue, or  lassitude.  HEENT:   No headaches,  Difficulty swallowing,  Tooth/dental problems, or  Sore throat,                No sneezing, itching, ear ache, nasal congestion, post nasal drip,   CV:  No chest pain,  Orthopnea, PND, swelling in lower extremities, anasarca, dizziness, palpitations, syncope.   GI  No heartburn, indigestion,  abdominal pain, nausea, vomiting, diarrhea, change in bowel habits, loss of appetite, bloody stools.   Resp:   No chest wall deformity  Skin: no rash or lesions.  GU: no dysuria, change in color of urine, no urgency or frequency.  No flank pain, no hematuria   MS:  No joint pain or swelling.  No decreased range of motion.  No back pain.    Physical Exam  BP 126/70 (BP Location: Left Arm, Patient Position: Sitting, Cuff Size: Normal)   Pulse 68   Temp 98.3 F (36.8 C) (Oral)   Ht 5\' 4"  (1.626 m)   Wt 149 lb 12.8 oz (67.9 kg)   SpO2 96%   BMI 25.71 kg/m   GEN: A/Ox3; pleasant , NAD, well nourished    HEENT:  Center/AT,   NOSE-clear, THROAT-clear, no lesions, no postnasal drip or exudate noted.   NECK:  Supple w/ fair ROM; no JVD; normal carotid impulses w/o bruits; no thyromegaly or nodules palpated; no lymphadenopathy.    RESP  scattered rhonchi   no accessory muscle use, no dullness to percussion  CARD:  RRR, no m/r/g, no peripheral edema, pulses intact, no cyanosis or clubbing.  GI:   Soft & nt; nml bowel sounds; no organomegaly or masses detected.   Musco: Warm bil, no deformities or joint swelling noted.   Neuro: alert, no focal deficits noted.    Skin: Warm, no lesions or rashes    Lab Results:     BNP No results found for:  BNP  ProBNP No results found for: PROBNP  Imaging: No results found.    PFT Results Latest Ref Rng & Units 12/02/2020  FVC-Pre L 2.51  FVC-Predicted Pre % 75  FVC-Post L 2.56  FVC-Predicted Post % 77  Pre FEV1/FVC % % 82  Post FEV1/FCV % % 83  FEV1-Pre L 2.06  FEV1-Predicted Pre % 81  FEV1-Post L 2.12  DLCO uncorrected ml/min/mmHg 15.43  DLCO UNC% % 73  DLCO corrected ml/min/mmHg 16.48  DLCO COR %Predicted % 78  DLVA Predicted % 103  TLC L 4.30  TLC % Predicted % 80  RV % Predicted % 80    No results found for: NITRICOXIDE      Assessment & Plan:   No problem-specific Assessment & Plan notes found for this encounter.     01/30/2021, NP 09/15/2021

## 2021-09-15 NOTE — Patient Instructions (Addendum)
Zpack take as directed. Prednisone 20mg  daily for 5 days then 10mg  daily for 5 days and stop .  Begin Probiotic daily for 2 weeks  Liquid Mucinex DM Twice daily  As needed  for cough /congesiton  Tessalon Three times a day  cough .  Fluids and rest  Tylenol As needed   Chest xray today  Continue on BREZTRI 2 puffs Twice daily  , rinse after use  Albuterol inhaler or neb As needed   Follow up with Dr. as planned and As needed   Please contact office for sooner follow up if symptoms do not improve or worsen or seek emergency care

## 2021-09-15 NOTE — Assessment & Plan Note (Signed)
Flare   Plan  Patient Instructions  Zpack take as directed. Prednisone 20mg  daily for 5 days then 10mg  daily for 5 days and stop .  Begin Probiotic daily for 2 weeks  Liquid Mucinex DM Twice daily  As needed  for cough /congesiton  Tessalon Three times a day  cough .  Fluids and rest  Tylenol As needed   Chest xray today  Continue on BREZTRI 2 puffs Twice daily  , rinse after use  Albuterol inhaler or neb As needed   Follow up with Dr. as planned and As needed   Please contact office for sooner follow up if symptoms do not improve or worsen or seek emergency care

## 2021-09-16 NOTE — Progress Notes (Signed)
Called and spoke with patient, advised of results/recommendations per Rubye Oaks NP, she verbalized understanding.  She stated she is breathing some better today.  Nothing further needed.

## 2021-11-09 ENCOUNTER — Telehealth: Payer: Self-pay | Admitting: Pulmonary Disease

## 2021-11-10 MED ORDER — BREZTRI AEROSPHERE 160-9-4.8 MCG/ACT IN AERO: 2.0000 | INHALATION_SPRAY | Freq: Two times a day (BID) | RESPIRATORY_TRACT | 6 refills | Status: DC

## 2021-11-10 NOTE — Telephone Encounter (Signed)
Script printed and faxed.   LM informing patient medication refill has been sent.   Nothing further needed at this time.

## 2021-11-11 NOTE — Telephone Encounter (Signed)
Please schedule patient for follow-up in Feb 2023 for follow-up

## 2021-11-11 NOTE — Telephone Encounter (Signed)
Called and spoke with pt to let her know about the message from [place], LPN and let her know that JE wanted [place] to schedule f/u. Pt verbalized understanding and has been scheduled for an appt with JE. Nothing further needed.

## 2021-12-02 ENCOUNTER — Telehealth: Payer: Self-pay | Admitting: Pulmonary Disease

## 2021-12-02 NOTE — Telephone Encounter (Signed)
Call made to patient, confirmed DOB. Patient states [employer] states there are having some delays with shipping however patient is concerned because she only has about 4-5 more doses left on her inhaler. She does want to try any other inhaler stating she has been tried on several and she would like to stick with Breztri. I made her aware to call back in a few days and check to see if we have any and to call back when she gets closer to being empty and we can get further recommendations from provider if she still has not received her Breztri by then.   Voiced understanding.   Nothing further needed at this time.

## 2021-12-08 ENCOUNTER — Telehealth: Payer: Self-pay | Admitting: Pulmonary Disease

## 2021-12-09 NOTE — Telephone Encounter (Signed)
Called and spoke with patient. She stated that she was told by [employer] that her Markus DaftBreztri had been shipped on 12/03/21. She has not received her inhalers. I advised her that I would call [employer] and check on the status.   Through the automated system I was able to find a tracking number. The tracking number is [id number] via [employer]. Per the tracking information, the inhalers are scheduled to arrive on 12/14/21.   Called and spoke with patient again. She is aware of the above information.   Nothing further needed at time of call.

## 2021-12-18 IMAGING — MG DIGITAL SCREENING BILAT W/ TOMO W/ CAD
6 of 12 series · 6 of 36 positions shown · non-contrast
Comparison: Previous exam(s).

CLINICAL DATA: Screening.

EXAM:
DIGITAL SCREENING BILATERAL MAMMOGRAM WITH TOMO AND CAD

[R MLO synth-2D (1 of 2)]
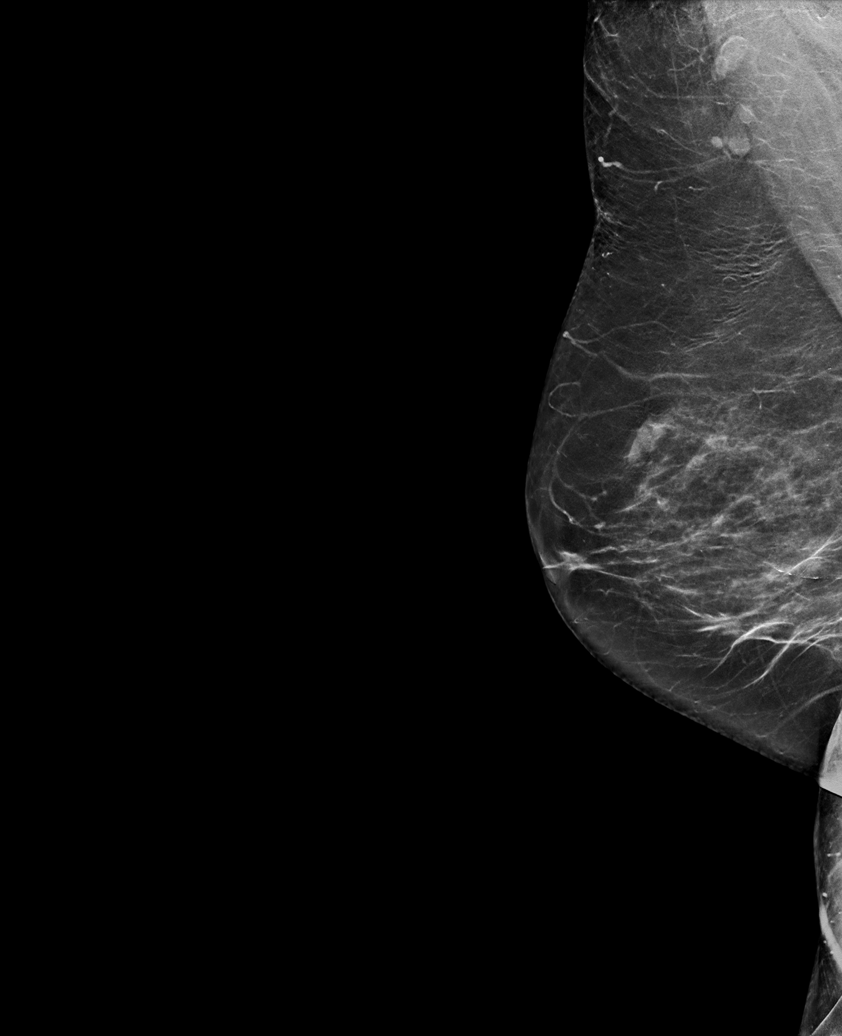

[R MLO synth-2D (2 of 2)]
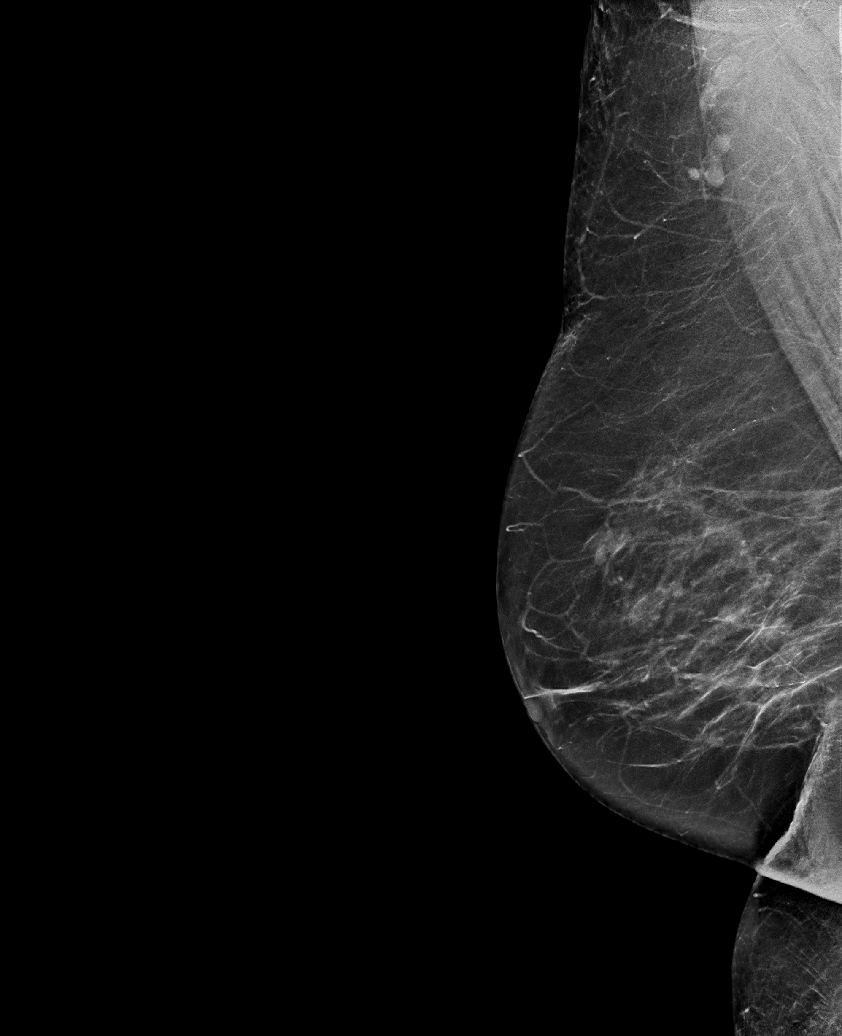

[R CC synth-2D]
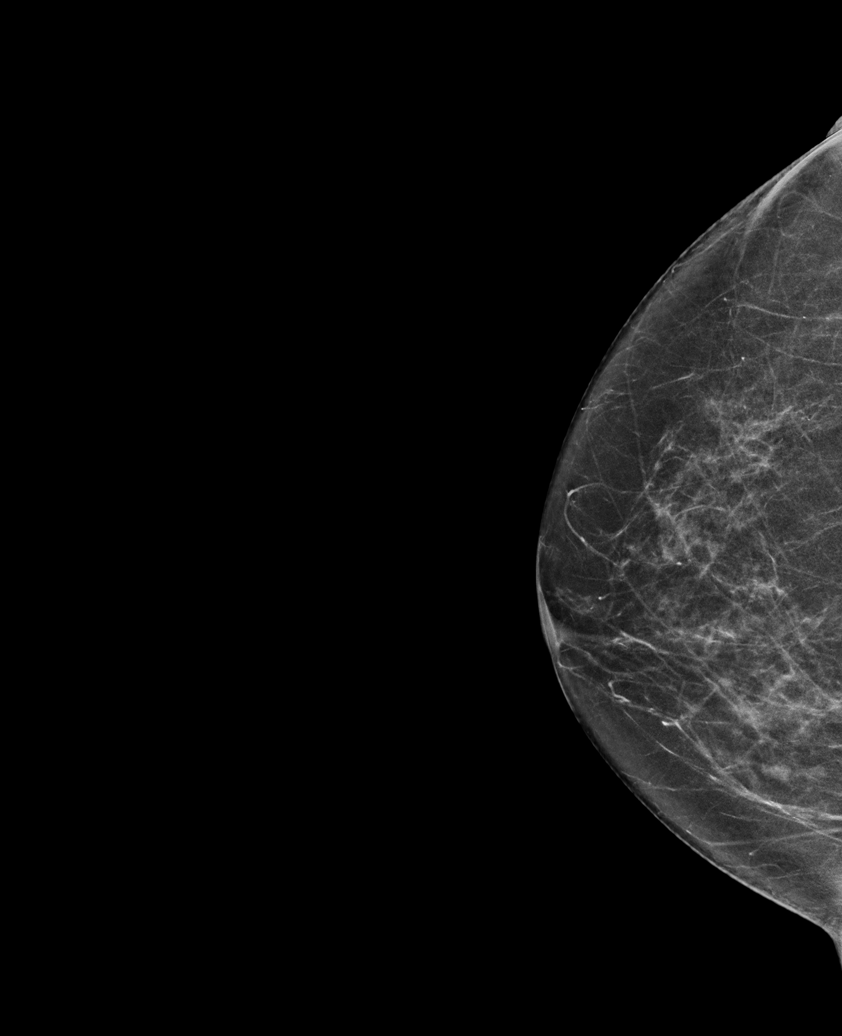

[L MLO synth-2D (1 of 2)]
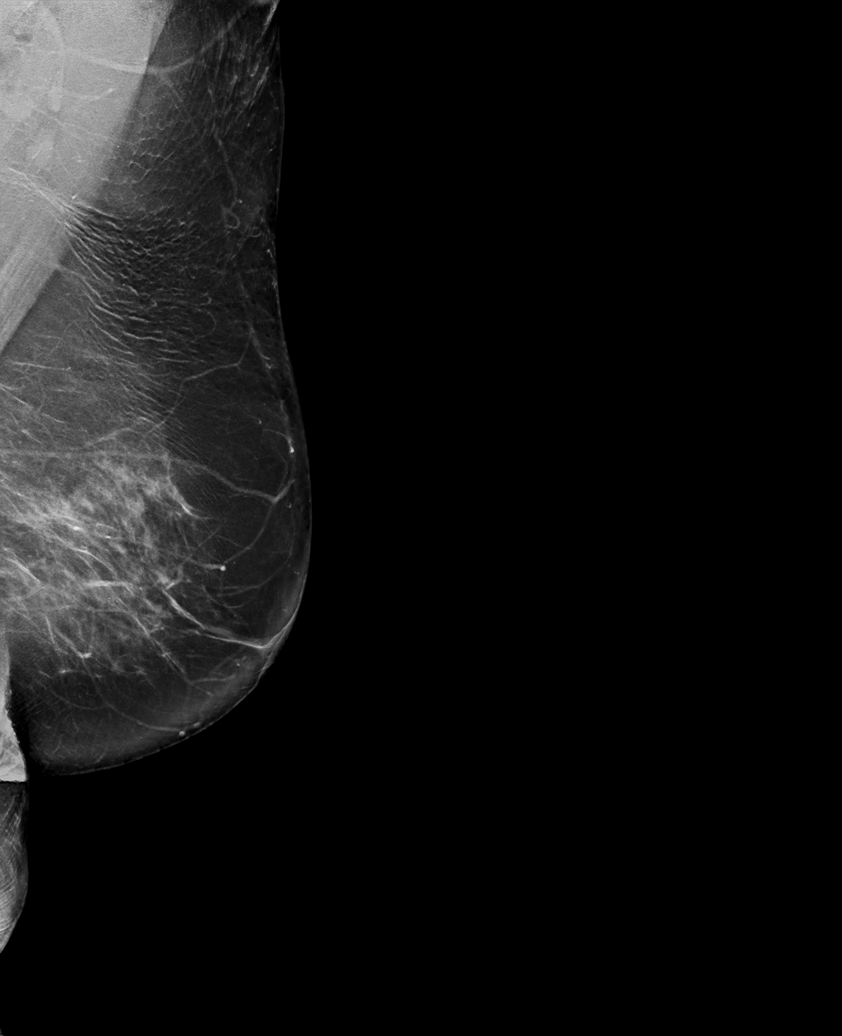

[L CC synth-2D]
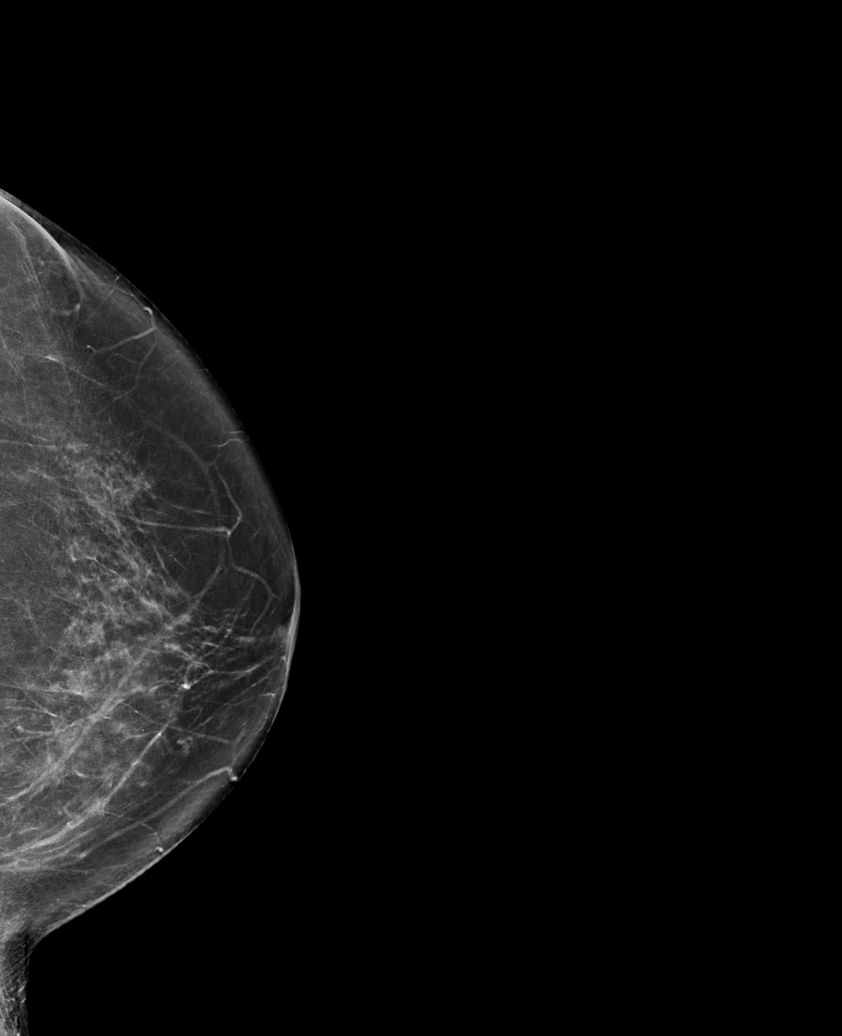

[L MLO synth-2D (2 of 2)]
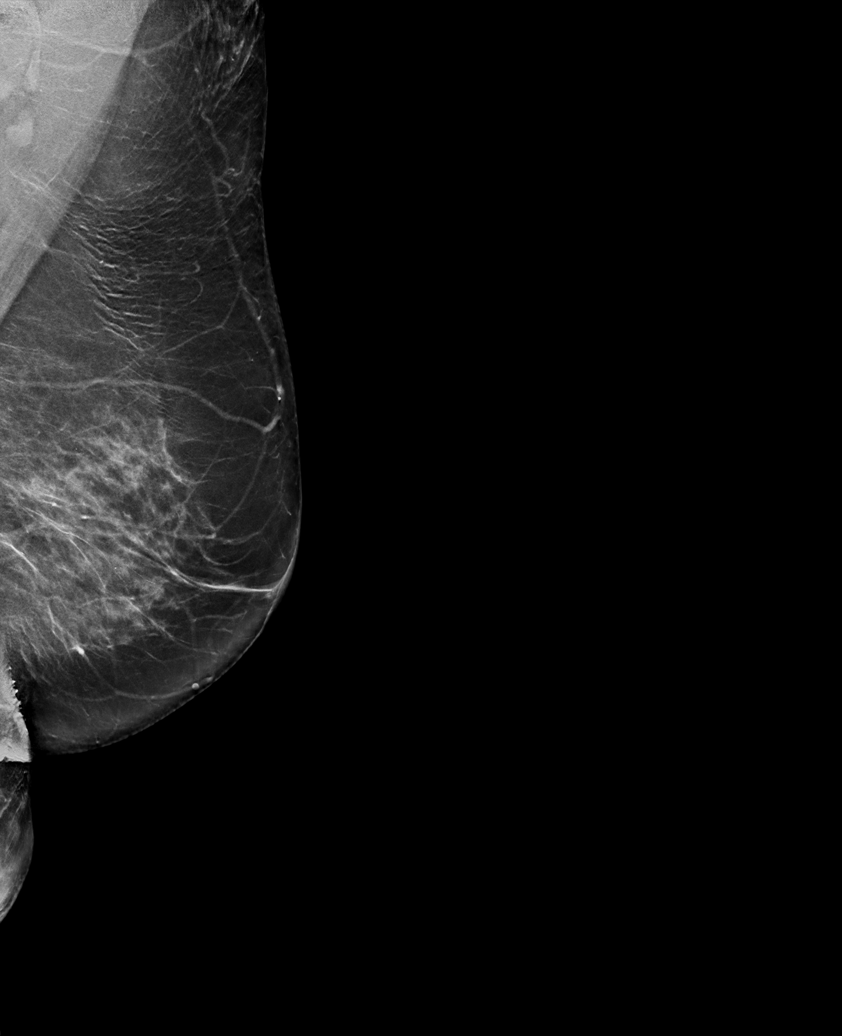

[6 of 36 positions shown; findings below may reference images not displayed]

ACR Breast Density Category c: The breast tissue is heterogeneously
dense, which may obscure small masses.
FINDINGS: There are no findings suspicious for malignancy. Images were
processed with CAD.
IMPRESSION: No mammographic evidence of malignancy. A result letter of this
screening mammogram will be mailed directly to the patient.

RECOMMENDATION:
Screening mammogram in one year. (Code:[id number])

BI-RADS CATEGORY  1: Negative.

## 2022-01-04 ENCOUNTER — Encounter: Payer: Self-pay | Admitting: Pulmonary Disease

## 2022-01-04 ENCOUNTER — Other Ambulatory Visit: Payer: Self-pay

## 2022-01-04 ENCOUNTER — Ambulatory Visit (INDEPENDENT_AMBULATORY_CARE_PROVIDER_SITE_OTHER): Payer: Medicare Other | Admitting: Pulmonary Disease

## 2022-01-04 VITALS — BP 114/78 | HR 73 | Temp 97.9°F | Ht 64.5 in | Wt 149.2 lb

## 2022-01-04 DIAGNOSIS — J9611 Chronic respiratory failure with hypoxia: Secondary | ICD-10-CM

## 2022-01-04 DIAGNOSIS — J4489 Other specified chronic obstructive pulmonary disease: Secondary | ICD-10-CM

## 2022-01-04 DIAGNOSIS — J449 Chronic obstructive pulmonary disease, unspecified: Secondary | ICD-10-CM | POA: Diagnosis not present

## 2022-01-04 NOTE — Progress Notes (Signed)
Subjective:   PATIENT ID: Misty Briggs GENDER: female DOB: Aug 19, 1951, MRN: [id number]   HPI  Chief Complaint  Patient presents with   Follow-up    asthma    Reason for Visit: Follow-up  [name] is 71 year old female never smoker with asthma-COPD, rheumatoid arthritis and hx covid pneumonia 09/2019 who presents for follow-up.  Synopsis: She was previously seen in [facility] in [place] by Dr. Jayme CloudGonzalez. She has tried multiple inhalers for her COPD and does not tolerate powder forms. She recently tried on ZionBreztri and feels this inhaler is doing really well for her.  Last COPD exacerbation was 10/2020  Since our last visit in 11/2020, she has had chronic left knee and ankle pain related to sciatica pain. Denies limitations in regular activity. She needed steroids and antibiotics from her podiatrist but nothing needed from a respiratory standpoint. She is pleased with her Breztri. Does not need albuterol or nebulizers.  Her last COPD exacerbation was in 10/2020 requiring hospitalization.  Denies shortness of breath, coughing or wheezing  01/04/22 She is compliant with Breztri. Rarely uses her albuterol. Had issues obtaining it due to delivery. Her last COPD exacerbation in October, non-covid infection, treated with steroids and zpack. She has shortness of breath but this is improved on treatment. Currently on her allergy season and getting shots weekly with Dr. Prichard CallasSharma. Will start to have issues soon. Denies wheezing. Sometimes cough at night. She walks the dog daily at a slow rate.   Social History: Husband has COPD   Past Medical History:  Diagnosis Date   Arthritis    Asthma    COPD (chronic obstructive pulmonary disease) (HCC)    [facility]   Diabetes mellitus    Fever of unknown origin 02/28/2020   GERD (gastroesophageal reflux disease)    Heart murmur    HLD (hyperlipidemia)    Hypertension    Myocardial infarction  (HCC)    mild   Pneumonia due to COVID-19 virus 09/26/2019   PONV (postoperative nausea and vomiting)    itching sometimes   Rheumatoid arthritis(714.0)    Shortness of breath      Allergies  Allergen Reactions   Doxycycline Other (See Comments)    Thrush   Penicillins Hives, Palpitations and Rash   Azithromycin Other (See Comments)    Thrush   Levofloxacin Other (See Comments)    Joint pain/muscle pain   Sulfa Antibiotics Rash     Outpatient Medications Prior to Visit  Medication Sig Dispense Refill   albuterol (VENTOLIN HFA) 108 (90 Base) MCG/ACT inhaler Inhale 1-2 puffs into the lungs every 6 (six) hours as needed for wheezing or shortness of breath.     Ascorbic Acid (VITAMIN C) 1000 MG tablet Take 1,000 mg by mouth daily.     aspirin EC 81 MG tablet Take by mouth daily.      Budeson-Glycopyrrol-Formoterol (BREZTRI AEROSPHERE) 160-9-4.8 MCG/ACT AERO Inhale 2 puffs into the lungs in the morning and at bedtime. 10.7 g 6   calcium carbonate (OS-CAL - DOSED IN MG OF ELEMENTAL CALCIUM) 1250 (500 Ca) MG tablet Take 2 tablets by mouth daily with breakfast.     Coenzyme Q10 (CO Q-10 PO) Take by mouth daily.     EPINEPHrine 0.3 mg/0.3 mL IJ SOAJ injection Inject 0.3 mLs into the muscle once as needed for anaphylaxis.     fluticasone (FLONASE) 50 MCG/ACT nasal spray Place 1 spray into both nostrils daily as  needed for allergies or rhinitis.     furosemide (LASIX) 20 MG tablet Take 20 mg by mouth daily.      gabapentin (NEURONTIN) 100 MG capsule Take 1 capsule by mouth at bedtime.     hydroxychloroquine (PLAQUENIL) 200 MG tablet Alternate daily with 1 tablet then the next day take 2 tablets     hydrOXYzine (ATARAX/VISTARIL) 25 MG tablet Take 25 mg by mouth every 4 (four) hours as needed for anxiety or itching.     levocetirizine (XYZAL) 5 MG tablet Take 5 mg by mouth every evening.     lovastatin (MEVACOR) 20 MG tablet Take 20 mg by mouth at bedtime.     magnesium gluconate (MAGONATE)  500 MG tablet Take 500 mg by mouth daily.     metoprolol tartrate (LOPRESSOR) 50 MG tablet Take 50 mg by mouth 2 (two) times daily.     montelukast (SINGULAIR) 10 MG tablet 1 tablet in the evening     Multiple Vitamin (MULTIVITAMIN) capsule Take 1 capsule by mouth daily.     nystatin (MYCOSTATIN) 100000 UNIT/ML suspension Use as directed 5 mLs in the mouth or throat 4 (four) times daily. Twice daily     omeprazole (PRILOSEC) 20 MG capsule Take 20 mg by mouth daily.     potassium chloride (KLOR-CON) 10 MEQ tablet Take 10 mEq by mouth daily.     vitamin B-12 (CYANOCOBALAMIN) 1000 MCG tablet Take 1,000 mcg by mouth daily.     albuterol (PROVENTIL) (2.5 MG/3ML) 0.083% nebulizer solution Take 3 mLs (2.5 mg total) by nebulization every 6 (six) hours. (Patient not taking: Reported on 01/04/2022) 360 mL 2   amLODipine (NORVASC) 5 MG tablet Take 1 tablet (5 mg total) by mouth daily. 90 tablet 0   benzonatate (TESSALON) 100 MG capsule Take 1 capsule (100 mg total) by mouth every 6 (six) hours as needed for cough. (Patient not taking: Reported on 01/04/2022) 60 capsule 3   ferrous sulfate 325 (65 FE) MG tablet Take 1 tablet (325 mg total) by mouth 2 (two) times daily with a meal. 60 tablet 0   metFORMIN (GLUCOPHAGE) 1000 MG tablet Take 1 tablet (1,000 mg total) by mouth 2 (two) times daily with a meal. 60 tablet 0   nystatin (MYCOSTATIN) 100000 UNIT/ML suspension Take 5 mLs (500,000 Units total) by mouth 4 (four) times daily. (Patient not taking: Reported on 01/04/2022) 60 mL 0   predniSONE (DELTASONE) 20 MG tablet 1 tab daily for 5 days , 1/2 tab daily for 5 days and then stop . (Patient not taking: Reported on 01/04/2022) 5 tablet 0   No facility-administered medications prior to visit.    Review of Systems  Constitutional:  Negative for chills, diaphoresis, fever, malaise/fatigue and weight loss.  HENT:  Negative for congestion.   Respiratory:  Positive for cough and shortness of breath. Negative for  hemoptysis, sputum production and wheezing.   Cardiovascular:  Negative for chest pain, palpitations and leg swelling.   Objective:   Vitals:   01/04/22 1340  BP: 114/78  Pulse: 73  Temp: 97.9 F (36.6 C)  TempSrc: Oral  SpO2: 97%  Weight: 149 lb 3.2 oz (67.7 kg)  Height: 5' 4.5" (1.638 m)   SpO2: 97 % O2 Device: None (Room air)  Physical Exam: General: Well-appearing, no acute distress HENT: [place], AT Eyes: EOMI, no scleral icterus Respiratory: Clear to auscultation bilaterally.  No crackles, wheezing or rales Cardiovascular: RRR, -M/R/G, no JVD Extremities:-Edema,-tenderness Neuro: AAO x4, CNII-XII grossly intact  Psych: Normal mood, normal affect  Data Reviewed:  Imaging: CT Chest 03/02/20 - Subsegemental atelectasis. Bilateral pleural effusions. Unchanged ground glass opacities.  CXR 10/29/20 - Chronic interstitial prominance CXR 09/15/21 - Chronic bronchitis changed. No infiltrate, effusion or edema  PFT: 12/03/19 FVC 2.56 (77%) FEV1 2.12 (84%) Ratio 82  TLC 80% DLCO 73% Interpretation: No obstructive or restrictive defect is present. No significant bronchodilator response. Isolated reduced DLCO may be suggestive of early ILD or PVD  09/30/2020 overnight oximetry that was completed on 08/06/2020 showed the patient spent 9.4 minutes below 88%, basal SPO2 was 93.4%.  Imaging, labs and test noted above have been reviewed independently by me.  Assessment & Plan:   Discussion: 71 year old female with asthma-COPD and allergic rhinitis who presents for follow-up. Currently well-controlled. Not active at baseline. Discussed clinical course and management of COPD/asthma including bronchodilator regimen and action plan for exacerbation. Discussed nocturnal hypoxemia and rechecking O2 to determine ongoing needs.   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 --Continue supplmental oxygen for 1L nightly --ORDER overnight oximetry on room air --DME: [facility] [phone]   Allergy --Weekly allergy shots  Rheumatoid Arthritis Previously on methotrexate --Plaquenil  Osteoporosis --Recommend minimizing steroid use if able  Health Maintenance Immunization History  Administered Date(s) Administered   Fluad Quad(high Dose 65+) 07/20/2019, 08/19/2020   Influenza, High Dose Seasonal PF 12/16/2016, 08/30/2017, 09/11/2019, 02/07/2020, 02/12/2021   Influenza-Unspecified 08/22/2020, 08/06/2021   Moderna Sars-Covid-2 Vaccination 06/18/2020, 07/14/2020, 02/12/2021   Pneumococcal Conjugate-13 01/23/2019   Pneumococcal Polysaccharide-23 12/16/2016, 08/30/2017, 08/24/2018, 09/11/2019, 02/07/2020, 09/25/2020, 02/12/2021   Zoster Recombinat (Shingrix) 04/13/2019, 06/27/2019   CT Lung Screen - not qualified  Orders Placed This Encounter  Procedures   Pulse oximetry, overnight    Standing Status:   Future    Standing Expiration Date:   01/04/2023    Scheduling Instructions:     overnight oximetry on room air to determine if you still need oxygen   No orders of the defined types were placed in this encounter.  Return in about 4 months (around 05/04/2022).  I have spent a total time of 35-minutes on the day of the appointment reviewing prior documentation, coordinating care and discussing medical diagnosis and plan with the patient/family. Past medical history, allergies, medications were reviewed. Pertinent imaging, labs and tests included in this note have been reviewed and interpreted independently by me.  Bunyan Brier Mechele Collin, MD [facility] 01/04/2022 3:36 PM  Office Number [phone]

## 2022-01-04 NOTE — Patient Instructions (Addendum)
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

## 2022-01-07 ENCOUNTER — Encounter: Payer: Self-pay | Admitting: Pulmonary Disease

## 2022-01-28 ENCOUNTER — Telehealth: Payer: Self-pay | Admitting: Pulmonary Disease

## 2022-01-28 NOTE — Telephone Encounter (Signed)
Overnight Oximetry ? ?I have reviewed ONO on 01/07/22. ?SpO2 71% Time <88% 13.7 min ? ?Patient will need to continue wear oxygen. Called patient and she expressed understanding with plan. ? ?Mechele CollinJane Rhona Fusilier, M.D. ?[facility] ?01/28/2022 5:36 PM  ? ? ? ?

## 2022-04-21 ENCOUNTER — Other Ambulatory Visit: Payer: Self-pay | Admitting: Obstetrics and Gynecology

## 2022-04-21 DIAGNOSIS — Z1231 Encounter for screening mammogram for malignant neoplasm of breast: Secondary | ICD-10-CM

## 2022-05-18 ENCOUNTER — Ambulatory Visit
Admission: RE | Admit: 2022-05-18 | Discharge: 2022-05-18 | Disposition: A | Discharge status: Home / Self Care | Payer: Medicare Other | Source: Ambulatory Visit | Attending: Obstetrics and Gynecology | Admitting: Obstetrics and Gynecology

## 2022-05-18 DIAGNOSIS — Z1231 Encounter for screening mammogram for malignant neoplasm of breast: Secondary | ICD-10-CM | POA: Diagnosis present

## 2022-08-24 ENCOUNTER — Ambulatory Visit (INDEPENDENT_AMBULATORY_CARE_PROVIDER_SITE_OTHER): Payer: Medicare Other | Admitting: Pulmonary Disease

## 2022-08-24 ENCOUNTER — Encounter: Payer: Self-pay | Admitting: Pulmonary Disease

## 2022-08-24 VITALS — BP 122/80 | HR 71 | Ht 64.0 in | Wt 151.0 lb

## 2022-08-24 DIAGNOSIS — J4489 Other specified chronic obstructive pulmonary disease: Secondary | ICD-10-CM | POA: Diagnosis not present

## 2022-08-24 DIAGNOSIS — J9611 Chronic respiratory failure with hypoxia: Secondary | ICD-10-CM

## 2022-08-24 NOTE — Progress Notes (Unsigned)
Subjective:   PATIENT ID: Misty Briggs GENDER: female DOB: 1951-03-28, MRN: [id number]   HPI  Chief Complaint  Patient presents with   Follow-up    Feeling so so just as usual  Wants to know about RSV vaccine and needs Breztri refill from [employer]    Reason for Visit: Follow-up  [name] is 71 year old female never smoker with asthma-COPD, rheumatoid arthritis and hx covid pneumonia 09/2019 who presents for follow-up.  Synopsis: She was previously seen in [facility] in [place] by Dr. Jayme Cloud. She has tried multiple inhalers for her COPD and does not tolerate powder forms. She recently tried on [place] and feels this inhaler is doing really well for her.  Last COPD exacerbation was 10/2020  Since our last visit in 11/2020, she has had chronic left knee and ankle pain related to sciatica pain. Denies limitations in regular activity. She needed steroids and antibiotics from her podiatrist but nothing needed from a respiratory standpoint. She is pleased with her Breztri. Does not need albuterol or nebulizers.  Her last COPD exacerbation was in 10/2020 requiring hospitalization.  Denies shortness of breath, coughing or wheezing  01/04/22 She is compliant with Breztri. Rarely uses her albuterol. Had issues obtaining it due to delivery. Her last COPD exacerbation in October, non-covid infection, treated with steroids and zpack. She has shortness of breath but this is improved on treatment. Currently on her allergy season and getting shots weekly with Dr. [place] Callas. Will start to have issues soon. Denies wheezing. Sometimes cough at night. She walks the dog daily at a slow rate.   08/24/22 Since our last visit, she is compliant with [name] and doing well. Rarely uses albuterol inhaler. No use in 6 months. Fall is usually her worse season with some hoarseness with her allergies. She is followed by [facility] in [place] and due for weekly shots. Compliant with oxygen nightly. Minimal  cough.  Social History: Husband has COPD   Past Medical History:  Diagnosis Date   Arthritis    Asthma    COPD (chronic obstructive pulmonary disease) (HCC)    Dr. Meredeth Ide, [facility] pulmonologist   Diabetes mellitus    Fever of unknown origin 02/28/2020   GERD (gastroesophageal reflux disease)    Heart murmur    HLD (hyperlipidemia)    Hypertension    Myocardial infarction (HCC)    mild   Pneumonia due to COVID-19 virus 09/26/2019   PONV (postoperative nausea and vomiting)    itching sometimes   Rheumatoid arthritis(714.0)    Shortness of breath      Allergies  Allergen Reactions   Doxycycline Other (See Comments)    Thrush   Penicillins Hives, Palpitations and Rash   Azithromycin Other (See Comments)    Thrush   Levofloxacin Other (See Comments)    Joint pain/muscle pain   Sulfa Antibiotics Rash     Outpatient Medications Prior to Visit  Medication Sig Dispense Refill   albuterol (VENTOLIN HFA) 108 (90 Base) MCG/ACT inhaler Inhale 1-2 puffs into the lungs every 6 (six) hours as needed for wheezing or shortness of breath.     Ascorbic Acid (VITAMIN C) 1000 MG tablet Take 1,000 mg by mouth daily.     aspirin EC 81 MG tablet Take by mouth daily.      Budeson-Glycopyrrol-Formoterol (BREZTRI AEROSPHERE) 160-9-4.8 MCG/ACT AERO Inhale 2 puffs into the lungs in the morning and at bedtime. 10.7 g 6   calcium carbonate (OS-CAL - DOSED  IN MG OF ELEMENTAL CALCIUM) 1250 (500 Ca) MG tablet Take 2 tablets by mouth daily with breakfast.     Coenzyme Q10 (CO Q-10 PO) Take by mouth daily.     EPINEPHrine 0.3 mg/0.3 mL IJ SOAJ injection Inject 0.3 mLs into the muscle once as needed for anaphylaxis.     fluticasone (FLONASE) 50 MCG/ACT nasal spray Place 1 spray into both nostrils daily as needed for allergies or rhinitis.     furosemide (LASIX) 20 MG tablet Take 20 mg by mouth daily.      gabapentin (NEURONTIN) 100 MG capsule Take 1 capsule by mouth at bedtime.      hydroxychloroquine (PLAQUENIL) 200 MG tablet Alternate daily with 1 tablet then the next day take 2 tablets     hydrOXYzine (ATARAX/VISTARIL) 25 MG tablet Take 25 mg by mouth every 4 (four) hours as needed for anxiety or itching.     levocetirizine (XYZAL) 5 MG tablet Take 5 mg by mouth every evening.     lovastatin (MEVACOR) 20 MG tablet Take 20 mg by mouth at bedtime.     magnesium gluconate (MAGONATE) 500 MG tablet Take 500 mg by mouth daily.     metoprolol tartrate (LOPRESSOR) 50 MG tablet Take 50 mg by mouth 2 (two) times daily.     montelukast (SINGULAIR) 10 MG tablet 1 tablet in the evening     Multiple Vitamin (MULTIVITAMIN) capsule Take 1 capsule by mouth daily.     nystatin (MYCOSTATIN) 100000 UNIT/ML suspension Use as directed 5 mLs in the mouth or throat 4 (four) times daily. Twice daily     omeprazole (PRILOSEC) 20 MG capsule Take 20 mg by mouth daily.     potassium chloride (KLOR-CON) 10 MEQ tablet Take 10 mEq by mouth daily.     vitamin B-12 (CYANOCOBALAMIN) 1000 MCG tablet Take 1,000 mcg by mouth daily.     albuterol (PROVENTIL) (2.5 MG/3ML) 0.083% nebulizer solution Take 3 mLs (2.5 mg total) by nebulization every 6 (six) hours. 360 mL 2   amLODipine (NORVASC) 5 MG tablet Take 1 tablet (5 mg total) by mouth daily. 90 tablet 0   benzonatate (TESSALON) 100 MG capsule Take 1 capsule (100 mg total) by mouth every 6 (six) hours as needed for cough. 60 capsule 3   ferrous sulfate 325 (65 FE) MG tablet Take 1 tablet (325 mg total) by mouth 2 (two) times daily with a meal. 60 tablet 0   metFORMIN (GLUCOPHAGE) 1000 MG tablet Take 1 tablet (1,000 mg total) by mouth 2 (two) times daily with a meal. 60 tablet 0   No facility-administered medications prior to visit.    Review of Systems  Constitutional:  Negative for chills, diaphoresis, fever, malaise/fatigue and weight loss.  HENT:  Negative for congestion.   Respiratory:  Negative for cough, hemoptysis, sputum production, shortness of  breath and wheezing.   Cardiovascular:  Negative for chest pain, palpitations and leg swelling.    Objective:   Vitals:   08/24/22 1451  BP: 122/80  Pulse: 71  SpO2: 97%  Weight: 151 lb (68.5 kg)  Height: 5\' 4"  (1.626 m)   SpO2: 97 % O2 Device: None (Room air)  Physical Exam: General: Well-appearing, no acute distress HENT: [place], AT Eyes: EOMI, no scleral icterus Respiratory: Clear to auscultation bilaterally.  No crackles, wheezing or rales Cardiovascular: RRR, -M/R/G, no JVD Extremities:-Edema,-tenderness Neuro: AAO x4, CNII-XII grossly intact Psych: Normal mood, normal affect  Data Reviewed:  Imaging: CT Chest 03/02/20 - Subsegemental  atelectasis. Bilateral pleural effusions. Unchanged ground glass opacities.  CXR 10/29/20 - Chronic interstitial prominance CXR 09/15/21 - Chronic bronchitis changed. No infiltrate, effusion or edema  PFT: 12/03/19 FVC 2.56 (77%) FEV1 2.12 (84%) Ratio 82  TLC 80% DLCO 73% Interpretation: No obstructive or restrictive defect is present. No significant bronchodilator response. Isolated reduced DLCO may be suggestive of early ILD or PVD  09/30/2020 overnight oximetry that was completed on 08/06/2020 showed the patient spent 9.4 minutes below 88%, basal SPO2 was 93.4%.  Imaging, labs and test noted above have been reviewed independently by me.  Assessment & Plan:   Discussion: 71 year old female with asthma-COPD and allergic rhinitis who presents for follow-up. Currently well-controlled. Not active at baseline. Discussed clinical course and management of COPD/asthma including bronchodilator regimen and action plan for exacerbation. Discussed nocturnal hypoxemia and rechecking O2 to determine ongoing needs.   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 --Continue supplemental oxygen for 1L nightly --DME: [facility] [phone] --Consider repeat ONO in March 2024   Allergy --Weekly allergy shots  Rheumatoid Arthritis Previously on methotrexate --Plaquenil  Osteoporosis --Recommend minimizing steroid use if able  Health Maintenance Immunization History  Administered Date(s) Administered   Fluad Quad(high Dose 65+) 07/20/2019, 08/19/2020   Influenza, High Dose Seasonal PF 12/16/2016, 08/30/2017, 09/11/2019, 02/07/2020, 02/12/2021, 02/18/2022   Influenza,inj,Quad PF,6-35 Mos 08/13/2022   Influenza-Unspecified 08/22/2020, 08/06/2021   Moderna Sars-Covid-2 Vaccination 06/18/2020, 07/14/2020, 02/12/2021   Pneumococcal Conjugate-13 01/23/2019   Pneumococcal Polysaccharide-23 12/16/2016, 08/30/2017, 08/24/2018, 09/11/2019, 02/07/2020, 09/25/2020, 02/12/2021, 02/18/2022   Zoster Recombinat (Shingrix) 04/13/2019, 06/27/2019   CT Lung Screen - not qualified  No orders of the defined types were placed in this encounter.  No orders of the defined types were placed in this encounter.  No follow-ups on file.  I have spent a total time of 35-minutes on the day of the appointment reviewing prior documentation, coordinating care and discussing medical diagnosis and plan with the patient/family. Past medical history, allergies, medications were reviewed. Pertinent imaging, labs and tests included in this note have been reviewed and interpreted independently by me.  [place], MD [facility] 08/24/2022 3:20 PM  Office Number [phone]

## 2022-08-24 NOTE — Patient Instructions (Signed)
  Asthma-COPD --CONTINUE Breztri TWO puffs TWICE a day --REAPPLY for application for AZ&Me --CONTINUE Albuterol as needed for shortness of breath or wheezing --Encourage regular aerobic activity as tolerated  Chronic hypoxemia secondary to covid pneumonia --Continue supplemental oxygen for 1L nightly --DME: [facility] [phone] --Consider repeat ONO in March 2024   Allergy --Weekly allergy shots  Follow-up with me in 6 months

## 2022-08-25 ENCOUNTER — Encounter: Payer: Self-pay | Admitting: Pulmonary Disease

## 2022-08-26 IMAGING — CT CT RENAL STONE PROTOCOL
3 of 4 series · 10 of 46 positions shown, 17 images · non-contrast
Comparison: CT 03/02/2020

CLINICAL DATA: Right-sided pain

EXAM:
CT ABDOMEN AND PELVIS WITHOUT CONTRAST
TECHNIQUE: Multidetector CT imaging of the abdomen and pelvis was performed
following the standard protocol without IV contrast.

[Series 4: lung bases · axial · 0.84mm/px · z∈[-736,-636]mm · 6 of 30 slices shown, 11 images]
[im 5/30  soft-tissue]
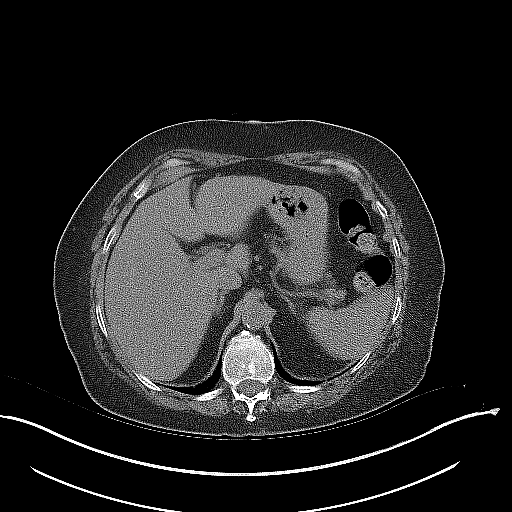
[im 5/30  bone]
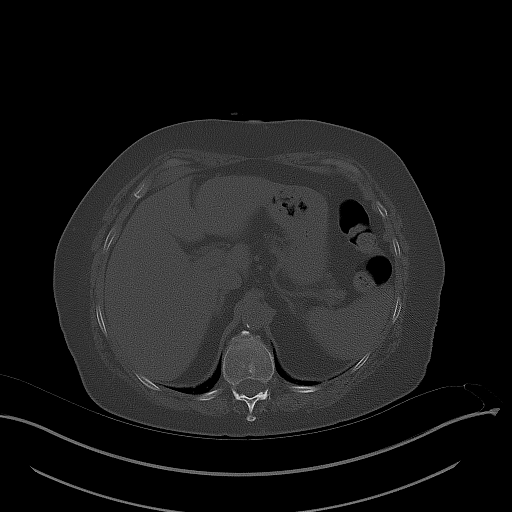
[im 9/30  soft-tissue]
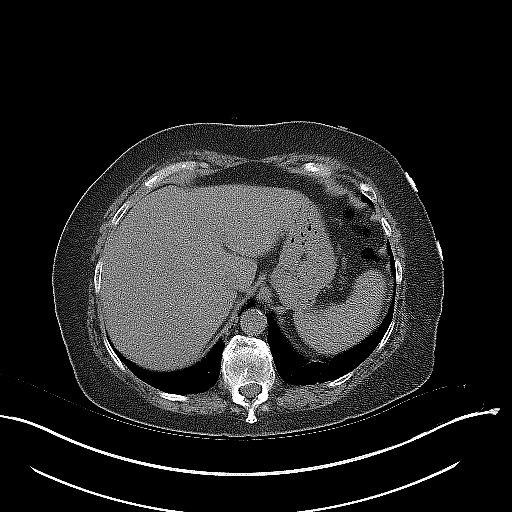
[im 13/30  soft-tissue]
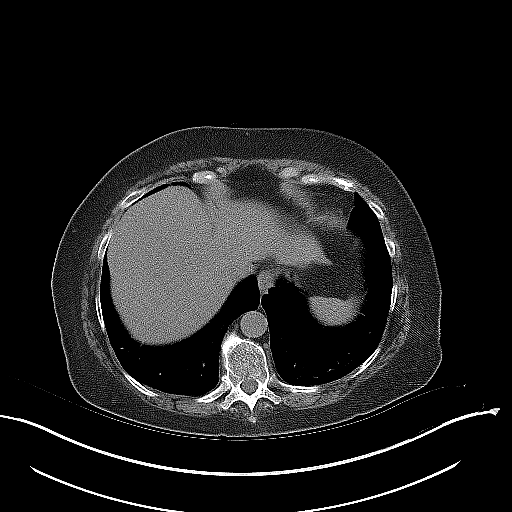
[im 13/30  lung]
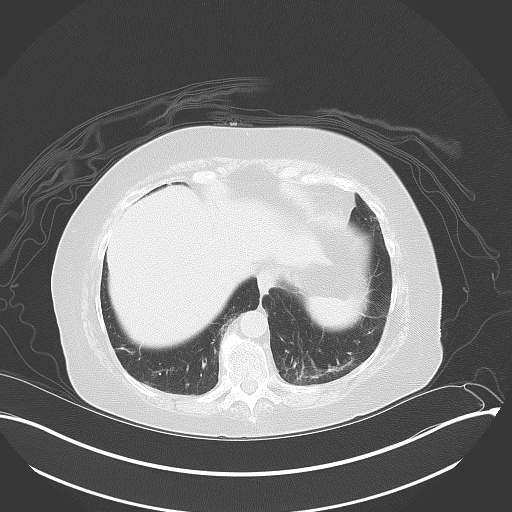
[im 17/30  soft-tissue]
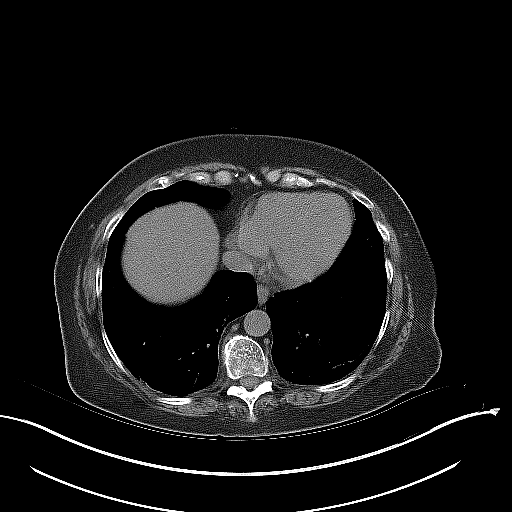
[im 17/30  lung]
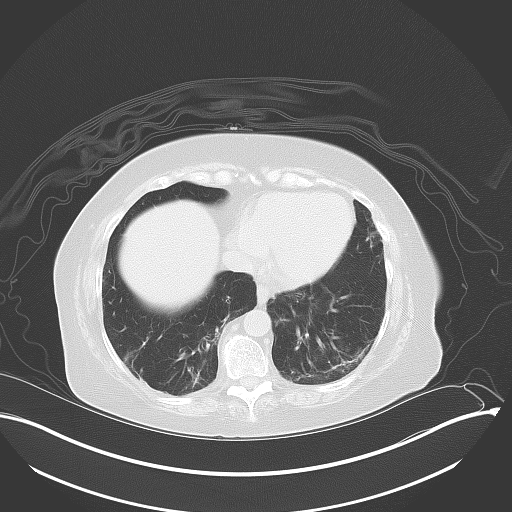
[im 21/30  soft-tissue]
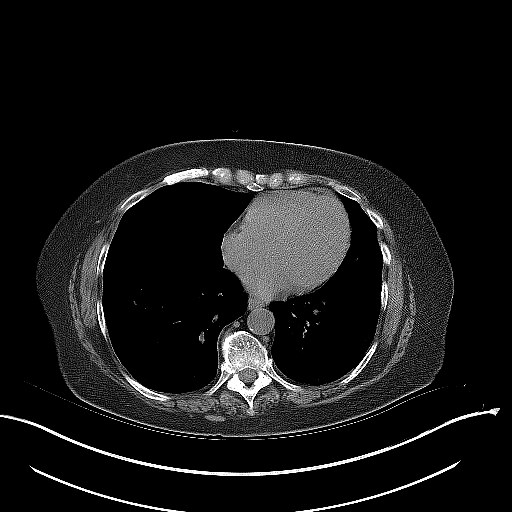
[im 21/30  lung]
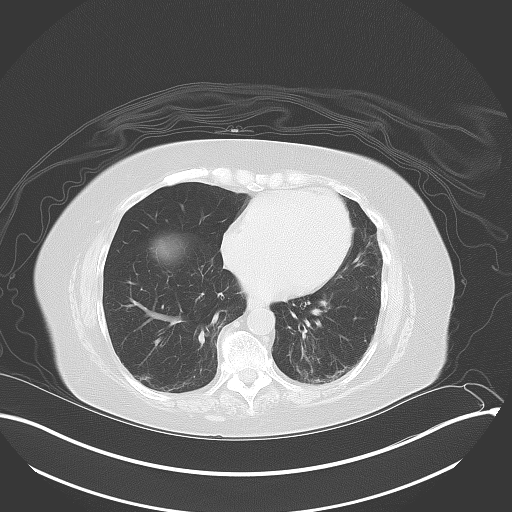
[im 25/30  soft-tissue]
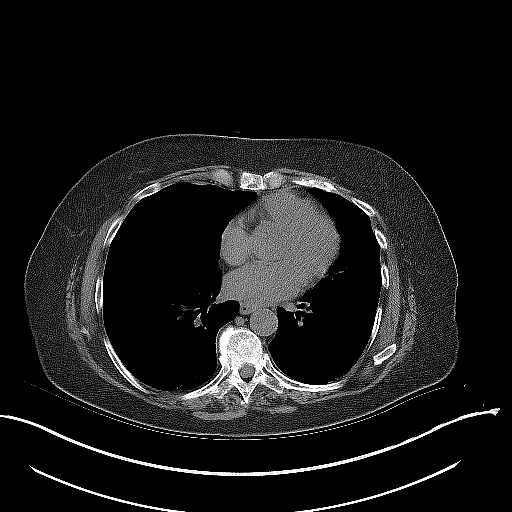
[im 25/30  lung]
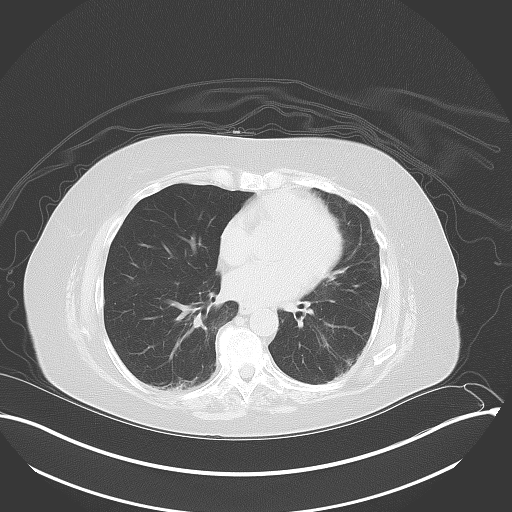

[Series 5: coronal · coronal · 0.80mm/px · 3 of 127 slices shown, 4 images]
[im 43/127  soft-tissue]
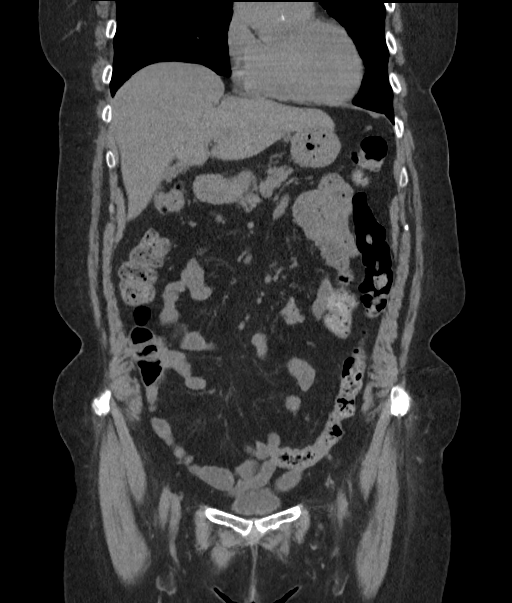
[im 57/127  soft-tissue]
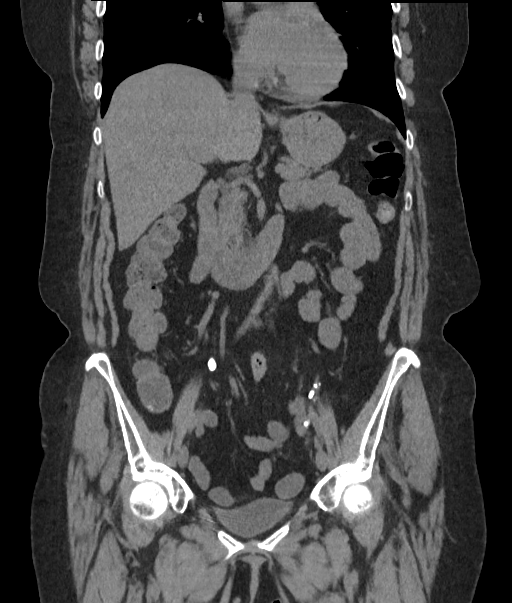
[im 57/127  bone]
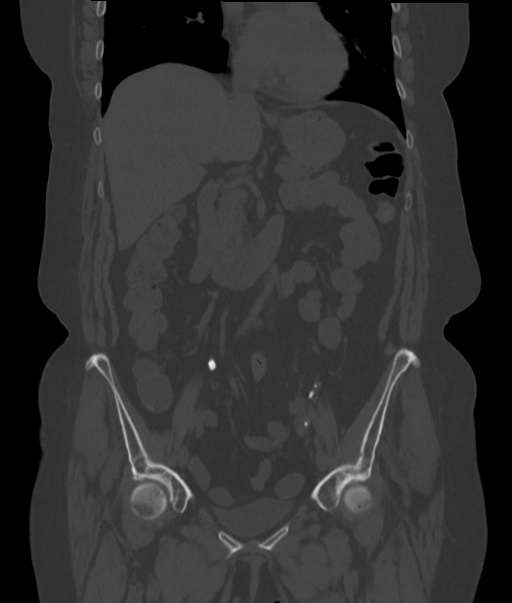
[im 71/127  soft-tissue]
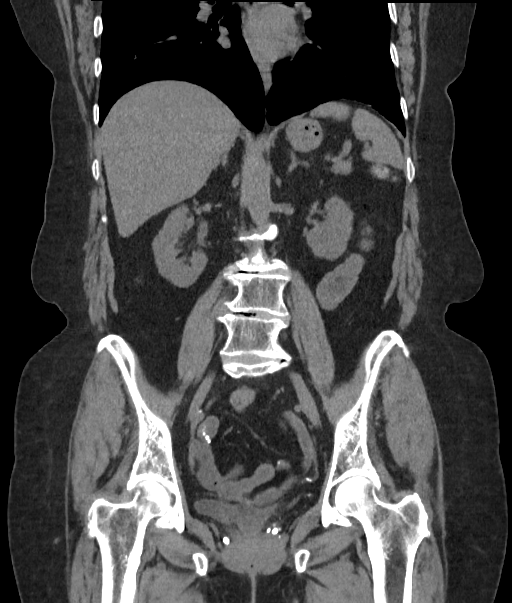

[Series 6: sagittal · sagittal · 0.52mm/px · 1 of 170 slices shown, 2 images]
[im 57/170  soft-tissue]
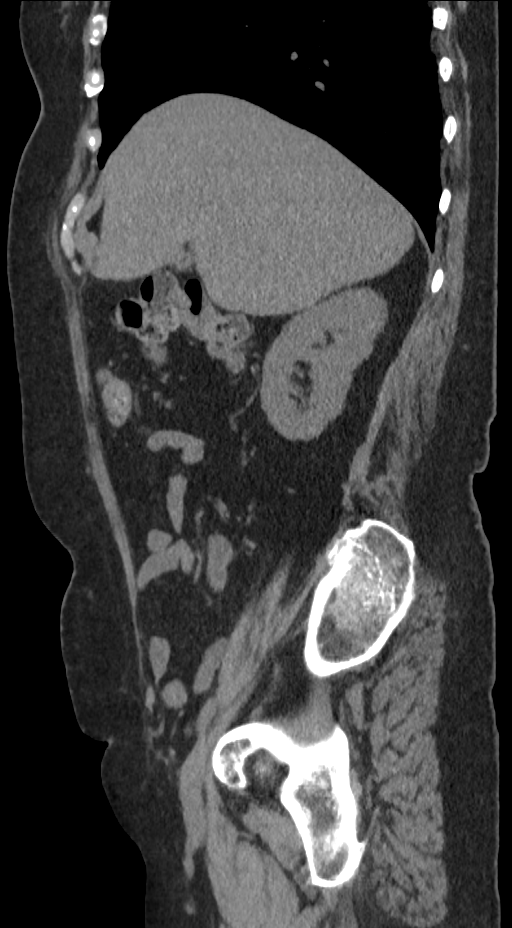
[im 57/170  bone]
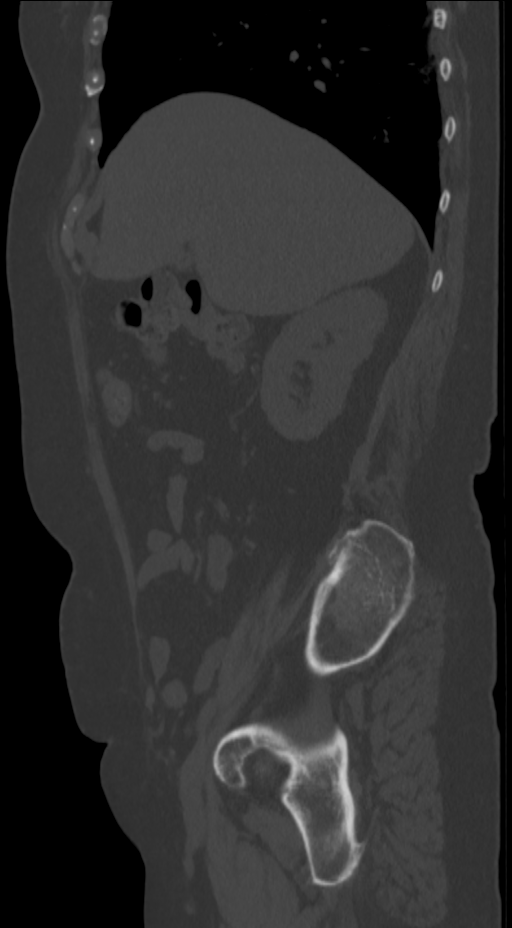

[10 of 46 positions shown; findings below may reference images not displayed]

FINDINGS: Lower chest: Lung bases demonstrate linear scarring within the
subpleural lower lobes. No acute consolidation or effusion. Normal
cardiac size.

Hepatobiliary: No focal liver abnormality is seen. No gallstones,
gallbladder wall thickening, or biliary dilatation.

Pancreas: Unremarkable. No pancreatic ductal dilatation or
surrounding inflammatory changes.

Spleen: Normal in size without focal abnormality.

Adrenals/Urinary Tract: Adrenal glands are within normal limits.
Kidneys show no hydronephrosis. The urinary bladder is unremarkable

Stomach/Bowel: Stomach is nonenlarged. No dilated small bowel. No
bowel wall thickening. Nonvisualized appendix but no right lower
quadrant inflammatory process.

Vascular/Lymphatic: Mild aortic atherosclerosis without aneurysm. No
suspicious nodes

Reproductive: Uterus and bilateral adnexa are unremarkable.

Other: Negative for free air or free fluid.

Musculoskeletal: Diffuse degenerative changes throughout the lumbar
spine. No acute or suspicious osseous abnormality.
IMPRESSION: No CT evidence for acute intra-abdominal or pelvic abnormality.

Aortic Atherosclerosis (86UCH-D59.9).

## 2022-08-26 IMAGING — CR DG CHEST 2V
1 series · 2 of 2 positions shown · non-contrast
Comparison: None.

CLINICAL DATA: Shortness of breath

EXAM:
CHEST - 2 VIEW

[Series 1: dg chest 2 view · 0.14mm/px · 2 of 2 slices shown]
[im 1/2]
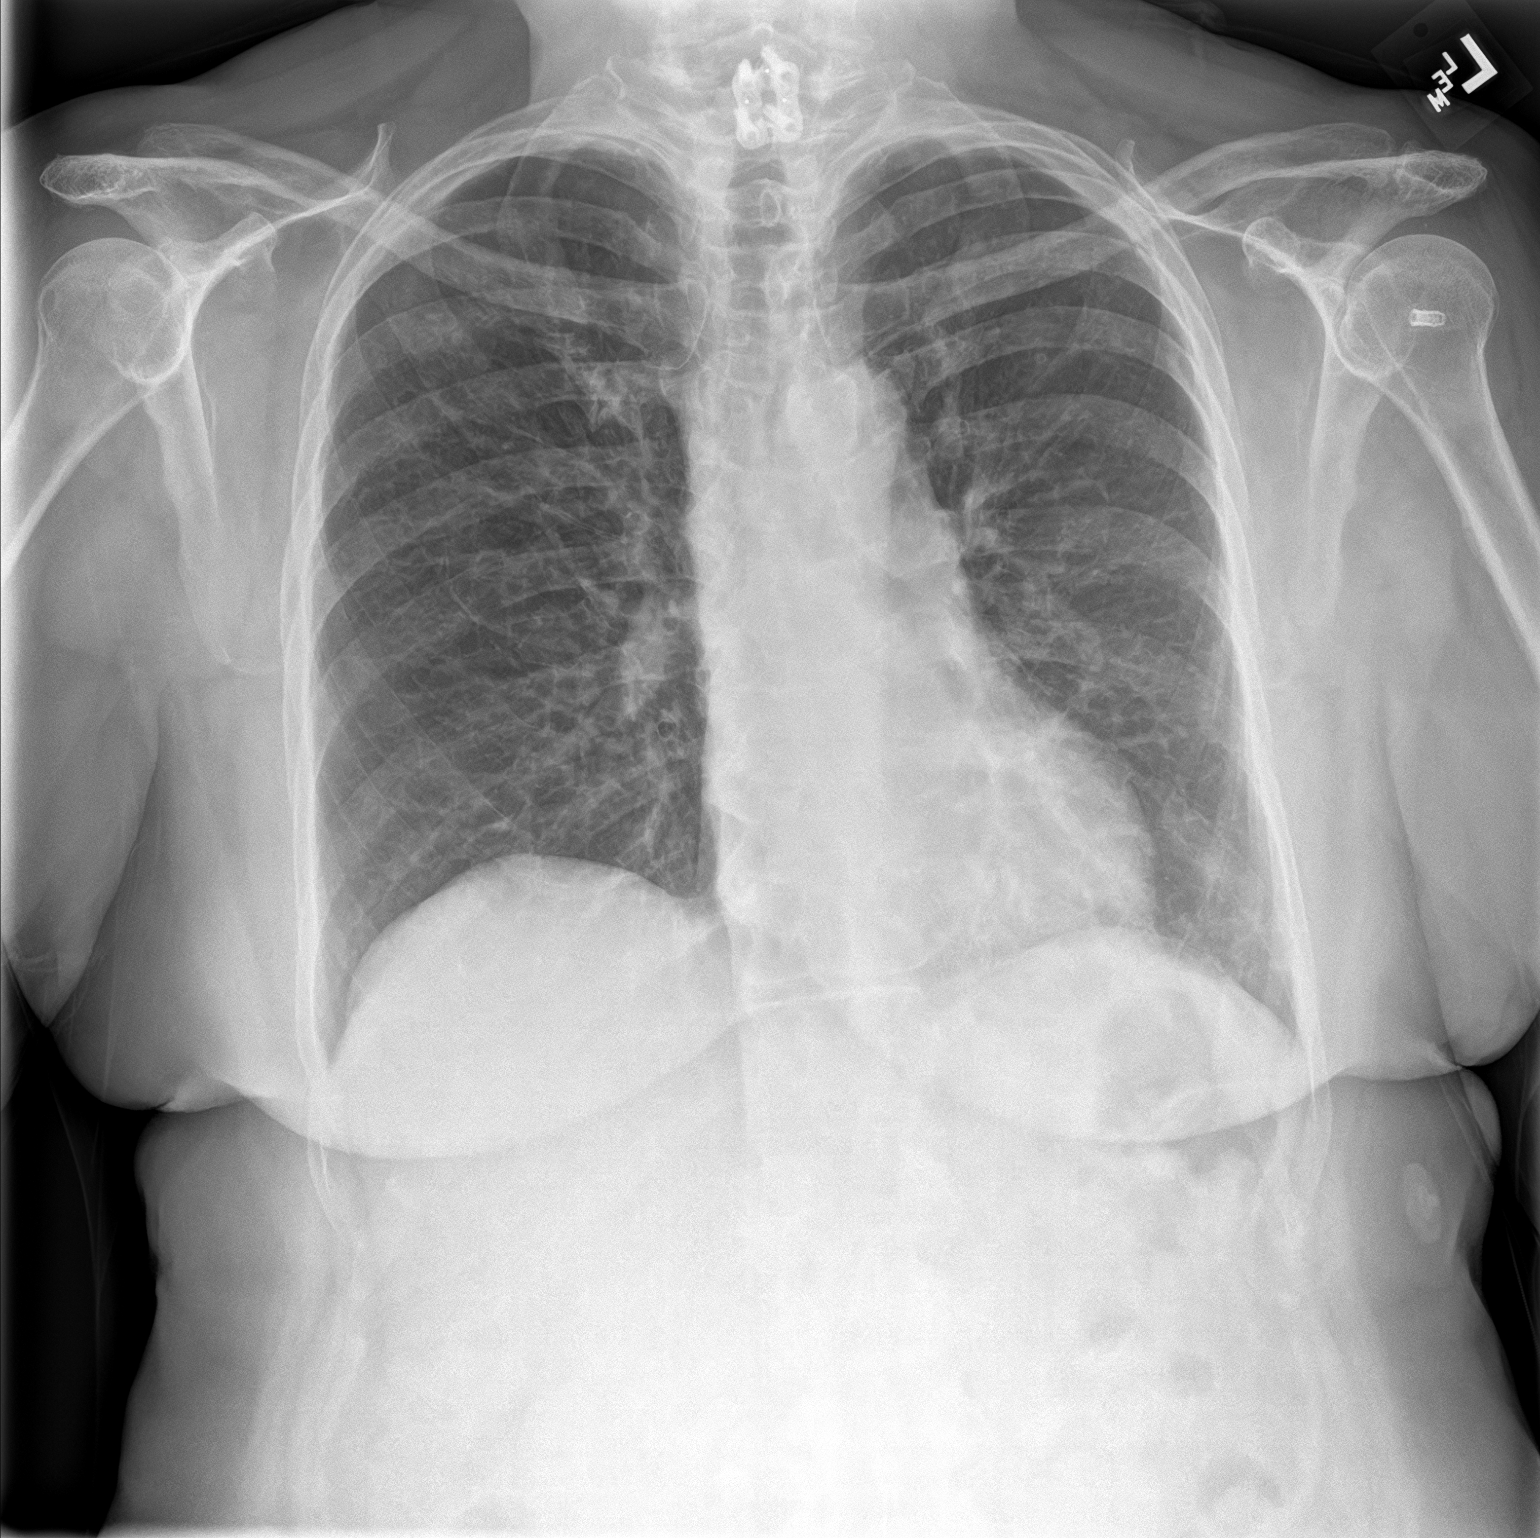
[im 2/2]
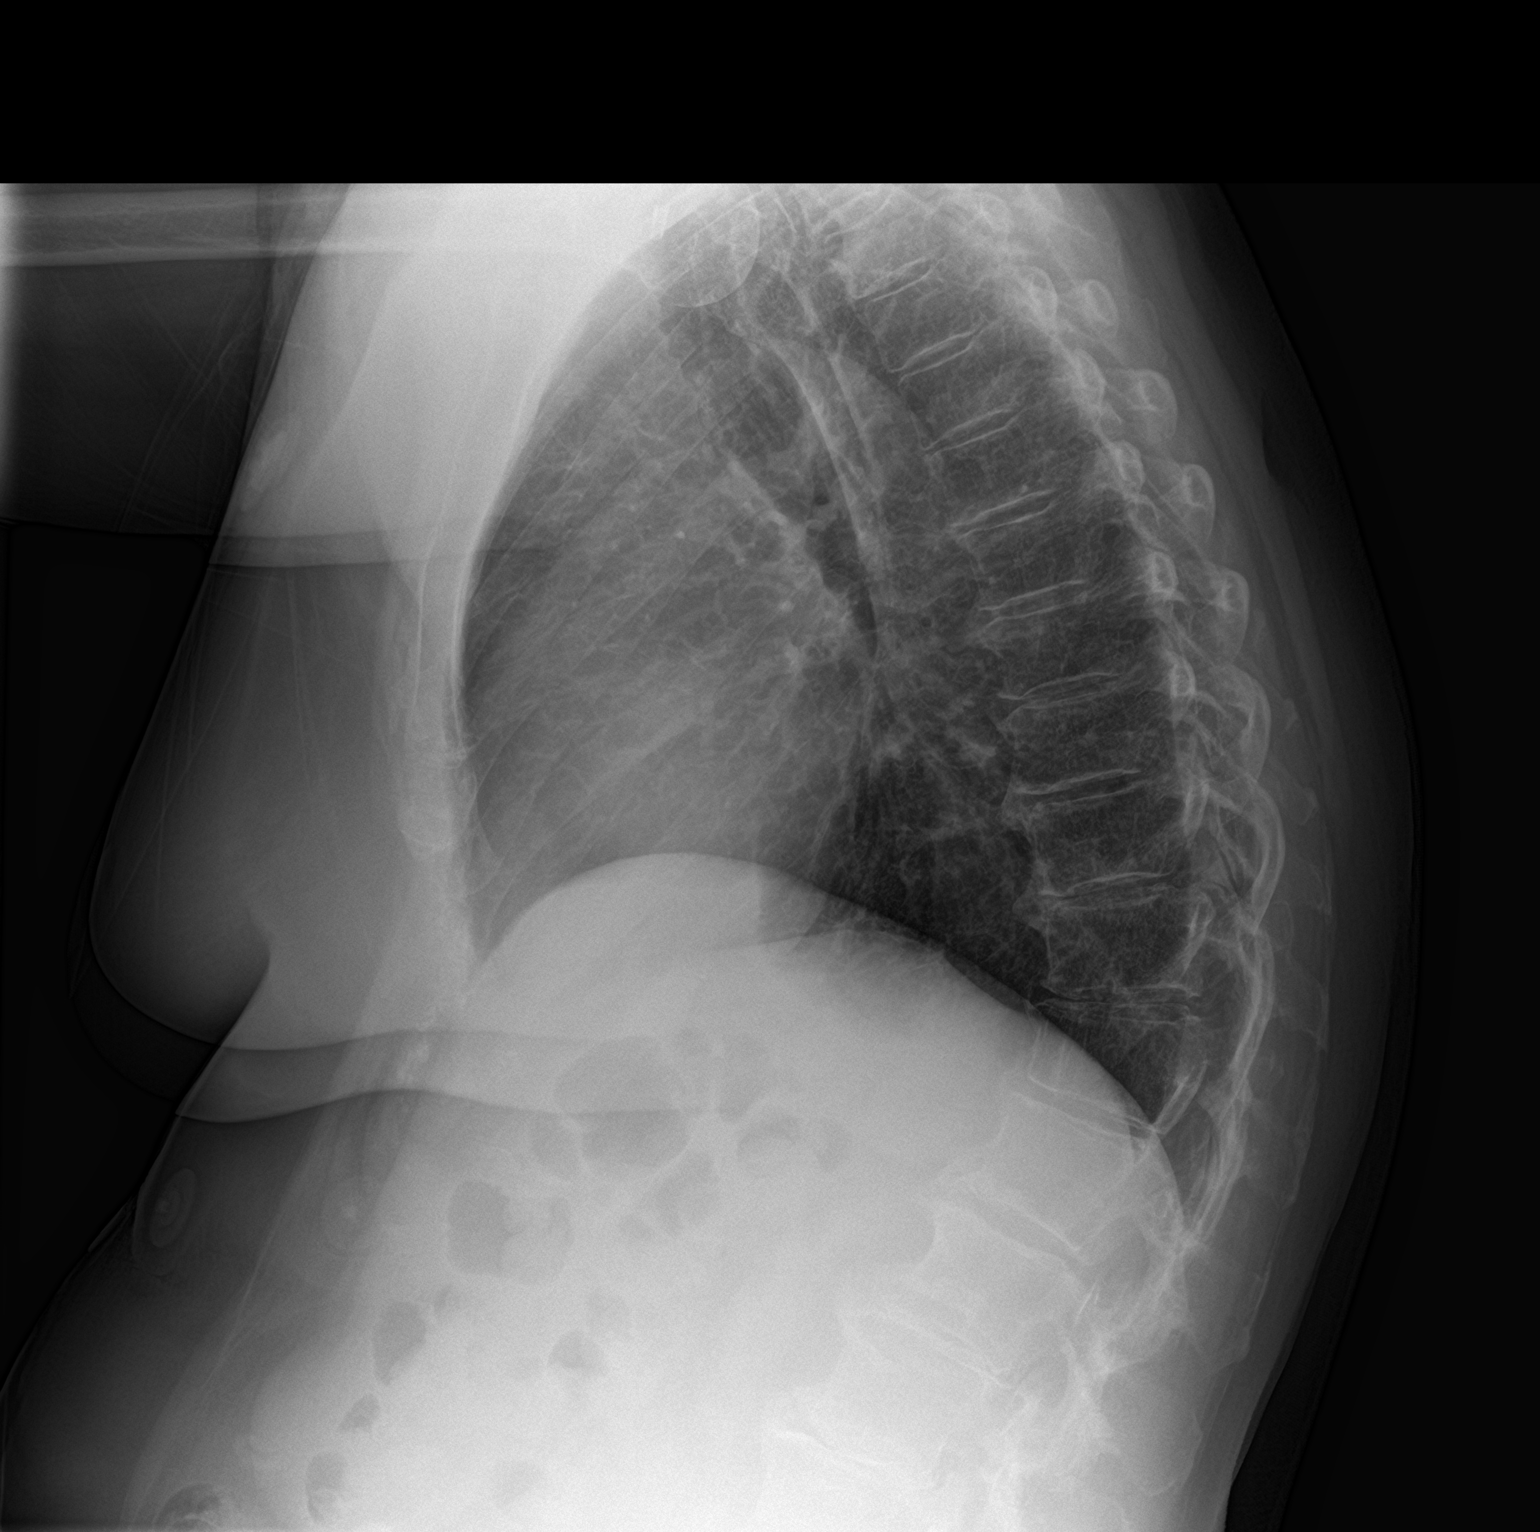

[2 of 2 positions shown; findings below may reference images not displayed]

FINDINGS: The heart size and mediastinal contours are within normal limits.
Mild chronic interstitial prominence. No new consolidation or edema.
No pleural effusion or pneumothorax. No acute osseous abnormality.
IMPRESSION: No acute process in the chest.

## 2022-09-21 ENCOUNTER — Other Ambulatory Visit (HOSPITAL_COMMUNITY): Payer: Self-pay

## 2022-09-22 ENCOUNTER — Other Ambulatory Visit (HOSPITAL_COMMUNITY): Payer: Self-pay

## 2022-09-29 ENCOUNTER — Telehealth: Payer: Self-pay | Admitting: Pulmonary Disease

## 2022-09-29 NOTE — Telephone Encounter (Signed)
What is the status of patient's Breztri application?

## 2022-10-08 NOTE — Telephone Encounter (Signed)
Was unable to reach a person when I called [employer]

## 2022-11-17 NOTE — Telephone Encounter (Signed)
Patient called to inform the doctor that she got a letter stating that she was approved for the [employer] program for 2024.  She wanted to know is there anything else she needs to do.  Please call patient to discuss at [phone]

## 2022-11-17 NOTE — Telephone Encounter (Signed)
Patient called to give additional information for [employer].  She stated they said that the nurse would have to give a handwritten signature and their fax # if [phone].  Please advise.

## 2022-11-24 ENCOUNTER — Telehealth: Payer: Self-pay | Admitting: Pulmonary Disease

## 2022-11-26 MED ORDER — BREZTRI AEROSPHERE 160-9-4.8 MCG/ACT IN AERO: 2.0000 | INHALATION_SPRAY | Freq: Two times a day (BID) | RESPIRATORY_TRACT | 6 refills | Status: DC

## 2022-11-26 NOTE — Telephone Encounter (Signed)
Rx printed and will be faxed after signed

## 2022-11-26 NOTE — Telephone Encounter (Signed)
Pt called on 11/24/22 requesting rx for Breztri be faxed to [facility]  Needs it to be signed by MD and faxed   Please take care of this today, thanks

## 2023-01-31 ENCOUNTER — Encounter (HOSPITAL_BASED_OUTPATIENT_CLINIC_OR_DEPARTMENT_OTHER): Payer: Self-pay | Admitting: Pulmonary Disease

## 2023-01-31 ENCOUNTER — Ambulatory Visit (INDEPENDENT_AMBULATORY_CARE_PROVIDER_SITE_OTHER): Payer: Medicare Other | Admitting: Pulmonary Disease

## 2023-01-31 VITALS — BP 130/70 | HR 65 | Ht 64.0 in | Wt 142.0 lb

## 2023-01-31 DIAGNOSIS — G4734 Idiopathic sleep related nonobstructive alveolar hypoventilation: Secondary | ICD-10-CM

## 2023-01-31 DIAGNOSIS — J4489 Other specified chronic obstructive pulmonary disease: Secondary | ICD-10-CM | POA: Diagnosis not present

## 2023-01-31 NOTE — Patient Instructions (Addendum)
  Asthma-COPD --CONTINUE Breztri TWO puffs TWICE a day --CONTINUE Albuterol as needed for shortness of breath or wheezing --Encourage regular aerobic activity as tolerated  Chronic hypoxemia secondary to covid pneumonia --DME: [facility] [phone] --ORDER ONO on room air to determine home O2 needs  Follow-up with me in 6 months

## 2023-01-31 NOTE — Progress Notes (Signed)
Subjective:   PATIENT ID: Misty Briggs GENDER: female DOB: 06/25/1951, MRN: [id number]   HPI  Chief Complaint  Patient presents with   Follow-up    Feeling better no updates    Reason for Visit: Follow-up  [name] is 72 year old female never smoker with asthma-COPD, rheumatoid arthritis and hx covid pneumonia 09/2019 who presents for follow-up.  Synopsis: She was previously seen in [facility] in [place] by Dr. Patsey Berthold. Established care at [facility] in [place] in 2021.  08/24/22 Since our last visit, she is compliant with Judithann Sauger and doing well. Rarely uses albuterol inhaler. No rescue inhaler use in 6 months. Last exacerbation was October 2022 with non-covid infection. Fall is usually her worse season with some hoarseness with her allergies. She is followed by [facility] in [place] and due for weekly shots. Compliant with oxygen nightly. Minimal cough.  01/31/23 She was previously using her husband's oxygen but he no longer has oxygen. She reports she is overall doing ok during the daytime. Compliant with Breztri. Rarely use albuterol. No exacerbations since our last visit.   Prior inhalers: Does not tolerate powder forms  Social History: Husband has COPD   Past Medical History:  Diagnosis Date   Arthritis    Asthma    COPD (chronic obstructive pulmonary disease) ([place])    Dr. Raul Del, [place] pulmonologist   Diabetes mellitus    Fever of unknown origin 02/28/2020   GERD (gastroesophageal reflux disease)    Heart murmur    History of COVID-19 11/03/2020   HLD (hyperlipidemia)    Hypertension    Myocardial infarction ([place])    mild   Pneumonia due to COVID-19 virus 09/26/2019   PONV (postoperative nausea and vomiting)    itching sometimes   Rheumatoid arthritis(714.0)    Shortness of breath      Allergies  Allergen Reactions   Doxycycline Other (See Comments)    Thrush   Penicillins Hives, Palpitations and Rash    Azithromycin Other (See Comments)    Thrush   Levofloxacin Other (See Comments)    Joint pain/muscle pain   Sulfa Antibiotics Rash     Outpatient Medications Prior to Visit  Medication Sig Dispense Refill   albuterol (VENTOLIN HFA) 108 (90 Base) MCG/ACT inhaler Inhale 1-2 puffs into the lungs every 6 (six) hours as needed for wheezing or shortness of breath.     amLODipine (NORVASC) 5 MG tablet Take 1 tablet (5 mg total) by mouth daily. 90 tablet 0   Ascorbic Acid (VITAMIN C) 1000 MG tablet Take 1,000 mg by mouth daily.     aspirin EC 81 MG tablet Take by mouth daily.      Budeson-Glycopyrrol-Formoterol (BREZTRI AEROSPHERE) 160-9-4.8 MCG/ACT AERO Inhale 2 puffs into the lungs in the morning and at bedtime. 10.7 g 6   calcium carbonate (OS-CAL - DOSED IN MG OF ELEMENTAL CALCIUM) 1250 (500 Ca) MG tablet Take 2 tablets by mouth daily with breakfast.     Coenzyme Q10 (CO Q-10 PO) Take by mouth daily.     EPINEPHrine 0.3 mg/0.3 mL IJ SOAJ injection Inject 0.3 mLs into the muscle once as needed for anaphylaxis.     fluticasone (FLONASE) 50 MCG/ACT nasal spray Place 1 spray into both nostrils daily as needed for allergies or rhinitis.     furosemide (LASIX) 20 MG tablet Take 20 mg by mouth daily.      gabapentin (NEURONTIN) 100 MG capsule Take 1 capsule by mouth  at bedtime.     hydroxychloroquine (PLAQUENIL) 200 MG tablet Alternate daily with 1 tablet then the next day take 2 tablets     hydrOXYzine (ATARAX/VISTARIL) 25 MG tablet Take 25 mg by mouth every 4 (four) hours as needed for anxiety or itching.     levocetirizine (XYZAL) 5 MG tablet Take 5 mg by mouth every evening.     lovastatin (MEVACOR) 20 MG tablet Take 20 mg by mouth at bedtime.     magnesium gluconate (MAGONATE) 500 MG tablet Take 500 mg by mouth daily.     metoprolol tartrate (LOPRESSOR) 50 MG tablet Take 50 mg by mouth 2 (two) times daily.     montelukast (SINGULAIR) 10 MG tablet 1 tablet in the evening     Multiple Vitamin  (MULTIVITAMIN) capsule Take 1 capsule by mouth daily.     omeprazole (PRILOSEC) 20 MG capsule Take 20 mg by mouth daily.     potassium chloride (KLOR-CON) 10 MEQ tablet Take 10 mEq by mouth daily.     vitamin B-12 (CYANOCOBALAMIN) 1000 MCG tablet Take 1,000 mcg by mouth daily.     albuterol (PROVENTIL) (2.5 MG/3ML) 0.083% nebulizer solution Take 3 mLs (2.5 mg total) by nebulization every 6 (six) hours. 360 mL 2   ferrous sulfate 325 (65 FE) MG tablet Take 1 tablet (325 mg total) by mouth 2 (two) times daily with a meal. 60 tablet 0   metFORMIN (GLUCOPHAGE) 1000 MG tablet Take 1 tablet (1,000 mg total) by mouth 2 (two) times daily with a meal. 60 tablet 0   nystatin (MYCOSTATIN) 100000 UNIT/ML suspension Use as directed 5 mLs in the mouth or throat 4 (four) times daily. Twice daily     No facility-administered medications prior to visit.    Review of Systems  Constitutional:  Negative for chills, diaphoresis, fever, malaise/fatigue and weight loss.  HENT:  Negative for congestion.   Respiratory:  Negative for cough, hemoptysis, sputum production, shortness of breath and wheezing.   Cardiovascular:  Negative for chest pain, palpitations and leg swelling.    Objective:   Vitals:   01/31/23 1106  BP: 130/70  Pulse: 65  SpO2: 96%  Weight: 142 lb (64.4 kg)  Height: '5\' 4"'$  (1.626 m)   SpO2: 96 % O2 Device: None (Room air)  Physical Exam: General: Well-appearing, no acute distress HENT: [place], AT Eyes: EOMI, no scleral icterus Respiratory: Clear to auscultation bilaterally.  No crackles, wheezing or rales Cardiovascular: RRR, -M/R/G, no JVD Extremities:-Edema,-tenderness Neuro: AAO x4, CNII-XII grossly intact Psych: Normal mood, normal affect  Data Reviewed:  Imaging: CT Chest 03/02/20 - Subsegemental atelectasis. Bilateral pleural effusions. Unchanged ground glass opacities.  CXR 10/29/20 - Chronic interstitial prominance CXR 09/15/21 - Chronic bronchitis changed. No infiltrate,  effusion or edema  PFT: 12/02/20 FVC 2.56 (77%) FEV1 2.12 (84%) Ratio 82  TLC 80% DLCO 73% Interpretation: No obstructive or restrictive defect is present. No significant bronchodilator response. Isolated reduced DLCO may be suggestive of early ILD or PVD  ONO: 08/06/2020 showed the patient spent 9.4 minutes below 88%, basal SPO2 was 93.4%.   Assessment & Plan:   Discussion: 72 year old female with asthma-COPD, nocturnal hypoxemia, allergic rhinitis on weekly allergy shots, GERD, RA and HLD who presents for follow-up.  Symptoms overall well-controlled on triple therapy however no longer has access to oxygen (previously was using her husband's device close). Discussed clinical course and management of COPD/asthma including bronchodilator regimen, preventive care including vaccinations and action plan for exacerbation.  Asthma-COPD --CONTINUE  Breztri TWO puffs TWICE a day --CONTINUE Albuterol as needed for shortness of breath or wheezing --Encourage regular aerobic activity as tolerated  Chronic hypoxemia secondary to covid pneumonia --DME: [facility] [phone] --ORDER ONO on room air to determine home O2 needs   Allergy --Weekly allergy shots. Epi pen PRN  Rheumatoid Arthritis Previously on methotrexate --Plaquenil  Osteoporosis --Recommend minimizing steroid use if able  Health Maintenance Immunization History  Administered Date(s) Administered   Fluad Quad(high Dose 65+) 07/20/2019, 08/19/2020   Influenza, High Dose Seasonal PF 12/16/2016, 08/30/2017, 09/11/2019, 02/07/2020, 02/12/2021, 02/18/2022   Influenza,inj,Quad PF,6-35 Mos 08/13/2022   Influenza-Unspecified 08/22/2020, 08/06/2021   Moderna Sars-Covid-2 Vaccination 06/18/2020, 07/14/2020, 02/12/2021   Pneumococcal Conjugate-13 01/23/2019   Pneumococcal Polysaccharide-23 12/16/2016, 08/30/2017, 08/24/2018, 09/11/2019, 02/07/2020, 09/25/2020, 02/12/2021, 02/18/2022   Zoster Recombinat (Shingrix)  04/13/2019, 06/27/2019   CT Lung Screen - not qualified  Orders Placed This Encounter  Procedures   Pulse oximetry, overnight    ONO on room air    Standing Status:   Future    Standing Expiration Date:   01/31/2024   No orders of the defined types were placed in this encounter.  Return in about 6 months (around 08/03/2023).  I have spent a total time of 32-minutes on the day of the appointment including chart review, data review, collecting history, coordinating care and discussing medical diagnosis and plan with the patient/family. Past medical history, allergies, medications were reviewed. Pertinent imaging, labs and tests included in this note have been reviewed and interpreted independently by me.  Tashunda Vandezande Rodman Pickle, MD [facility] Office Number [phone]

## 2023-03-09 ENCOUNTER — Telehealth (HOSPITAL_BASED_OUTPATIENT_CLINIC_OR_DEPARTMENT_OTHER): Payer: Self-pay | Admitting: Pulmonary Disease

## 2023-03-09 DIAGNOSIS — G4734 Idiopathic sleep related nonobstructive alveolar hypoventilation: Secondary | ICD-10-CM

## 2023-03-09 NOTE — Telephone Encounter (Signed)
[facility] Telephone Encounter  ONO 02/24/23  SpO2 < 88% 30 min and 44 seconds [place] SpO2 65% Patient qualifies for oxygen  Assessment/Plan  Nocturnal Hypoxemia --ORDER 2L O2 via [place] with sleep --Please contact patient with results and plan

## 2023-03-09 NOTE — Telephone Encounter (Signed)
Talk to patient she is agreeable to oxygen, o2 ordered

## 2023-03-18 ENCOUNTER — Encounter: Payer: Self-pay | Admitting: Pulmonary Disease

## 2023-03-31 ENCOUNTER — Encounter: Payer: Self-pay | Admitting: Gastroenterology

## 2023-03-31 NOTE — H&P (Signed)
Pre-Procedure H&P   Patient ID: Misty Briggs is a 72 y.o. female.  Gastroenterology Provider: [name], DO  Referring Provider: Tawni Pummel, PA PCP: Jerl Mina, MD  Date: 04/01/2023  HPI Ms. Misty Briggs is a 72 y.o. female who presents today for Esophagogastroduodenoscopy and Colonoscopy for GERD, weight loss, early satiety, personal history of colon polyps .  Patient reports losing approximately 10 pounds in 4 months.  She reports early satiety.  Reflux overall controlled on PPI but if she misses multiple doses she notices return of symptoms.  No dysphagia or odynophagia  Has a bowel movement every 2 to 3 days with increased straining and constipation.  No melena or hematochezia  Recent lab work hemoglobin 1.5 MCV 92 platelets 268,000 creatinine 1.0  Underwent colonoscopy in October 2018 demonstrating hyperplastic polyp sigmoid reticulosis and internal hemorrhoids  EGD and colonoscopy in August 2013 demonstrating 1 tubular adenomatous polyp.  Biopsies negative for H. pylori and Barrett's esophagus  EGD and colonoscopy in 2004   Past Medical History:  Diagnosis Date   Arthritis    Asthma    COPD (chronic obstructive pulmonary disease) (HCC)    Dr. Meredeth Ide, [facility] pulmonologist   Diabetes mellitus    Fever of unknown origin 02/28/2020   GERD (gastroesophageal reflux disease)    Heart murmur    History of COVID-19 11/03/2020   HLD (hyperlipidemia)    Hypertension    Myocardial infarction (HCC)    mild   Pneumonia due to COVID-19 virus 09/26/2019   PONV (postoperative nausea and vomiting)    itching sometimes   Rheumatoid arthritis(714.0)    Shortness of breath     Past Surgical History:  Procedure Laterality Date   ANTERIOR CERVICAL DECOMP/DISCECTOMY FUSION  05/05/2012   Procedure: ANTERIOR CERVICAL DECOMPRESSION/DISCECTOMY FUSION 1 LEVEL;  Surgeon: Karn Cassis, MD;  Location: [facility] NEURO ORS;  Service: Neurosurgery;   Laterality: N/A;  Cervical six seven Anterior cervical decompression/diskectomy, Fusion, Plate   COLONOSCOPY WITH PROPOFOL N/A 09/08/2017   Procedure: COLONOSCOPY WITH PROPOFOL;  Surgeon: Christena Deem, MD;  Location: [facility] ENDOSCOPY;  Service: Endoscopy;  Laterality: N/A;   ENDOMETRIAL ABLATION     novasure   FOOT SURGERY  2015   ROTATOR CUFF REPAIR     bilat   TRANSTHORACIC ECHOCARDIOGRAM  2010   [facility]   TUBAL LIGATION      Family History No h/o GI disease or malignancy  Review of Systems  Constitutional:  Positive for unexpected weight change. Negative for activity change, appetite change, chills, diaphoresis, fatigue and fever.  HENT:  Negative for trouble swallowing and voice change.   Respiratory:  Negative for shortness of breath and wheezing.   Cardiovascular:  Negative for chest pain, palpitations and leg swelling.  Gastrointestinal:  Negative for abdominal distention, abdominal pain, anal bleeding, blood in stool, constipation, diarrhea, nausea, rectal pain and vomiting.       + Early satiety and GERD  Musculoskeletal:  Negative for arthralgias and myalgias.  Skin:  Negative for color change and pallor.  Neurological:  Negative for dizziness, syncope and weakness.  Psychiatric/Behavioral:  Negative for confusion.   All other systems reviewed and are negative.    Medications No current facility-administered medications on file prior to encounter.   Current Outpatient Medications on File Prior to Encounter  Medication Sig Dispense Refill   albuterol (VENTOLIN HFA) 108 (90 Base) MCG/ACT inhaler Inhale 1-2 puffs into the lungs every 6 (six) hours  as needed for wheezing or shortness of breath.     Ascorbic Acid (VITAMIN C) 1000 MG tablet Take 1,000 mg by mouth daily.     aspirin EC 81 MG tablet Take by mouth daily.      calcium carbonate (OS-CAL - DOSED IN MG OF ELEMENTAL CALCIUM) 1250 (500 Ca) MG tablet Take 2 tablets by mouth daily with breakfast.      Coenzyme Q10 (CO Q-10 PO) Take by mouth daily.     fluticasone (FLONASE) 50 MCG/ACT nasal spray Place 1 spray into both nostrils daily as needed for allergies or rhinitis.     gabapentin (NEURONTIN) 100 MG capsule Take 1 capsule by mouth at bedtime.     hydroxychloroquine (PLAQUENIL) 200 MG tablet Alternate daily with 1 tablet then the next day take 2 tablets     hydrOXYzine (ATARAX/VISTARIL) 25 MG tablet Take 25 mg by mouth every 4 (four) hours as needed for anxiety or itching.     levocetirizine (XYZAL) 5 MG tablet Take 5 mg by mouth every evening.     lovastatin (MEVACOR) 20 MG tablet Take 20 mg by mouth at bedtime.     magnesium gluconate (MAGONATE) 500 MG tablet Take 500 mg by mouth daily.     metoprolol tartrate (LOPRESSOR) 50 MG tablet Take 50 mg by mouth 2 (two) times daily.     montelukast (SINGULAIR) 10 MG tablet 1 tablet in the evening     Multiple Vitamin (MULTIVITAMIN) capsule Take 1 capsule by mouth daily.     omeprazole (PRILOSEC) 20 MG capsule Take 20 mg by mouth daily.     potassium chloride (KLOR-CON) 10 MEQ tablet Take 10 mEq by mouth daily.     vitamin B-12 (CYANOCOBALAMIN) 1000 MCG tablet Take 1,000 mcg by mouth daily.     amLODipine (NORVASC) 5 MG tablet Take 1 tablet (5 mg total) by mouth daily. 90 tablet 0   Budeson-Glycopyrrol-Formoterol (BREZTRI AEROSPHERE) 160-9-4.8 MCG/ACT AERO Inhale 2 puffs into the lungs in the morning and at bedtime. 10.7 g 6   EPINEPHrine 0.3 mg/0.3 mL IJ SOAJ injection Inject 0.3 mLs into the muscle once as needed for anaphylaxis.     ferrous sulfate 325 (65 FE) MG tablet Take 1 tablet (325 mg total) by mouth 2 (two) times daily with a meal. 60 tablet 0   furosemide (LASIX) 20 MG tablet Take 20 mg by mouth daily.      metFORMIN (GLUCOPHAGE) 1000 MG tablet Take 1 tablet (1,000 mg total) by mouth 2 (two) times daily with a meal. 60 tablet 0    Pertinent medications related to GI and procedure were reviewed by me with the patient prior to the  procedure   Current Facility-Administered Medications:    0.9 %  sodium chloride infusion, , Intravenous, Continuous, [name], DO, Last Rate: 20 mL/hr at 04/01/23 1145, Continued from Pre-op at 04/01/23 1145  sodium chloride 20 mL/hr at 04/01/23 1145       Allergies  Allergen Reactions   Doxycycline Other (See Comments)    Thrush   Penicillins Hives, Palpitations and Rash   Azithromycin Other (See Comments)    Thrush   Levofloxacin Other (See Comments)    Joint pain/muscle pain   Sulfa Antibiotics Rash   Allergies were reviewed by me prior to the procedure  Objective   Body mass index is 23 kg/m. Vitals:   04/01/23 0954  BP: 138/68  Pulse: 66  Resp: 20  Temp: (!) 96.5 F (35.8 C)  TempSrc: Temporal  SpO2: 100%  Weight: 62.7 kg  Height: 5\' 5"  (1.651 m)     Physical Exam Vitals and nursing note reviewed.  Constitutional:      General: She is not in acute distress.    Appearance: Normal appearance. She is not ill-appearing, toxic-appearing or diaphoretic.  HENT:     Head: Normocephalic and atraumatic.     Nose: Nose normal.     Mouth/Throat:     Mouth: Mucous membranes are moist.     Pharynx: Oropharynx is clear.  Eyes:     General: No scleral icterus.    Extraocular Movements: Extraocular movements intact.  Cardiovascular:     Rate and Rhythm: Normal rate and regular rhythm.     Heart sounds: Normal heart sounds. No murmur heard.    No friction rub. No gallop.  Pulmonary:     Effort: Pulmonary effort is normal. No respiratory distress.     Breath sounds: Normal breath sounds. No wheezing, rhonchi or rales.  Abdominal:     General: Bowel sounds are normal. There is no distension.     Palpations: Abdomen is soft.     Tenderness: There is no abdominal tenderness. There is no guarding or rebound.  Musculoskeletal:     Cervical back: Neck supple.     Right lower leg: No edema.     Left lower leg: No edema.  Skin:    General: Skin is warm  and dry.     Coloration: Skin is not jaundiced or pale.  Neurological:     General: No focal deficit present.     Mental Status: She is alert and oriented to person, place, and time. Mental status is at baseline.  Psychiatric:        Mood and Affect: Mood normal.        Behavior: Behavior normal.        Thought Content: Thought content normal.        Judgment: Judgment normal.      Assessment:  Ms. KATHLEN SPRAUER is a 72 y.o. female  who presents today for Esophagogastroduodenoscopy and Colonoscopy for GERD, weight loss, early satiety, personal history of colon polyps .  Plan:  Esophagogastroduodenoscopy and Colonoscopy with possible intervention today  Esophagogastroduodenoscopy and Colonoscopy with possible biopsy, control of bleeding, polypectomy, and interventions as necessary has been discussed with the patient/patient representative. Informed consent was obtained from the patient/patient representative after explaining the indication, nature, and risks of the procedure including but not limited to death, bleeding, perforation, missed neoplasm/lesions, cardiorespiratory compromise, and reaction to medications. Opportunity for questions was given and appropriate answers were provided. Patient/patient representative has verbalized understanding is amenable to undergoing the procedure.   [name], DO  [facility] Gastroenterology  Portions of the record may have been created with voice recognition software. Occasional wrong-word or 'sound-a-like' substitutions may have occurred due to the inherent limitations of voice recognition software.  Read the chart carefully and recognize, using context, where substitutions may have occurred.

## 2023-04-01 ENCOUNTER — Ambulatory Visit: Payer: Medicare Other | Admitting: Anesthesiology

## 2023-04-01 ENCOUNTER — Encounter
Admission: RE | Disposition: A | Discharge status: Home / Self Care | Payer: Self-pay | Source: Home / Self Care | Attending: Gastroenterology

## 2023-04-01 ENCOUNTER — Ambulatory Visit
Admission: RE | Admit: 2023-04-01 | Discharge: 2023-04-01 | Disposition: A | Discharge status: Home / Self Care | Payer: Medicare Other | Attending: Gastroenterology | Admitting: Gastroenterology

## 2023-04-01 ENCOUNTER — Encounter: Payer: Self-pay | Admitting: Gastroenterology

## 2023-04-01 DIAGNOSIS — K219 Gastro-esophageal reflux disease without esophagitis: Secondary | ICD-10-CM | POA: Diagnosis not present

## 2023-04-01 DIAGNOSIS — J4489 Other specified chronic obstructive pulmonary disease: Secondary | ICD-10-CM | POA: Insufficient documentation

## 2023-04-01 DIAGNOSIS — Z6823 Body mass index (BMI) 23.0-23.9, adult: Secondary | ICD-10-CM | POA: Insufficient documentation

## 2023-04-01 DIAGNOSIS — K5900 Constipation, unspecified: Secondary | ICD-10-CM | POA: Insufficient documentation

## 2023-04-01 DIAGNOSIS — D123 Benign neoplasm of transverse colon: Secondary | ICD-10-CM | POA: Diagnosis not present

## 2023-04-01 DIAGNOSIS — K640 First degree hemorrhoids: Secondary | ICD-10-CM | POA: Insufficient documentation

## 2023-04-01 DIAGNOSIS — K635 Polyp of colon: Secondary | ICD-10-CM | POA: Diagnosis not present

## 2023-04-01 DIAGNOSIS — E119 Type 2 diabetes mellitus without complications: Secondary | ICD-10-CM | POA: Insufficient documentation

## 2023-04-01 DIAGNOSIS — K449 Diaphragmatic hernia without obstruction or gangrene: Secondary | ICD-10-CM | POA: Diagnosis not present

## 2023-04-01 DIAGNOSIS — R634 Abnormal weight loss: Secondary | ICD-10-CM | POA: Insufficient documentation

## 2023-04-01 DIAGNOSIS — K222 Esophageal obstruction: Secondary | ICD-10-CM | POA: Diagnosis not present

## 2023-04-01 DIAGNOSIS — D128 Benign neoplasm of rectum: Secondary | ICD-10-CM | POA: Diagnosis not present

## 2023-04-01 DIAGNOSIS — D124 Benign neoplasm of descending colon: Secondary | ICD-10-CM | POA: Insufficient documentation

## 2023-04-01 DIAGNOSIS — Z7984 Long term (current) use of oral hypoglycemic drugs: Secondary | ICD-10-CM | POA: Insufficient documentation

## 2023-04-01 DIAGNOSIS — I10 Essential (primary) hypertension: Secondary | ICD-10-CM | POA: Diagnosis not present

## 2023-04-01 DIAGNOSIS — D122 Benign neoplasm of ascending colon: Secondary | ICD-10-CM | POA: Insufficient documentation

## 2023-04-01 DIAGNOSIS — Z86010 Personal history of colonic polyps: Secondary | ICD-10-CM | POA: Diagnosis present

## 2023-04-01 DIAGNOSIS — Z1211 Encounter for screening for malignant neoplasm of colon: Secondary | ICD-10-CM | POA: Diagnosis not present

## 2023-04-01 DIAGNOSIS — I252 Old myocardial infarction: Secondary | ICD-10-CM | POA: Diagnosis not present

## 2023-04-01 DIAGNOSIS — R6881 Early satiety: Secondary | ICD-10-CM | POA: Diagnosis present

## 2023-04-01 HISTORY — PX: ESOPHAGOGASTRODUODENOSCOPY (EGD) WITH PROPOFOL: SHX5813

## 2023-04-01 HISTORY — PX: COLONOSCOPY: SHX5424

## 2023-04-01 LAB — GLUCOSE, CAPILLARY: Glucose-Capillary: 92 mg/dL (ref 70–99)

## 2023-04-01 SURGERY — COLONOSCOPY
Anesthesia: General

## 2023-04-01 MED ORDER — DEXMEDETOMIDINE HCL IN NACL 80 MCG/20ML IV SOLN
INTRAVENOUS | Status: DC | PRN
  Administered 2023-04-01 (×2): 8 ug via INTRAVENOUS

## 2023-04-01 MED ORDER — PROPOFOL 10 MG/ML IV BOLUS
INTRAVENOUS | Status: DC | PRN
  Administered 2023-04-01: 25 mg via INTRAVENOUS
  Administered 2023-04-01: 150 ug/kg/min via INTRAVENOUS

## 2023-04-01 MED ORDER — LIDOCAINE HCL (CARDIAC) PF 100 MG/5ML IV SOSY
PREFILLED_SYRINGE | INTRAVENOUS | Status: DC | PRN
  Administered 2023-04-01: 50 mg via INTRAVENOUS

## 2023-04-01 MED ORDER — SODIUM CHLORIDE 0.9 % IV SOLN
INTRAVENOUS | Status: DC
  Administered 2023-04-01: 20 mL/h via INTRAVENOUS

## 2023-04-01 NOTE — Interval H&P Note (Signed)
History and Physical Interval Note: Preprocedure H&P from 04/01/23  was reviewed and there was no interval change after seeing and examining the patient.  Written consent was obtained from the patient after discussion of risks, benefits, and alternatives. Patient has consented to proceed with Esophagogastroduodenoscopy and Colonoscopy with possible intervention   04/01/2023 11:46 AM  Misty Briggs  has presented today for surgery, with the diagnosis of V12.72 (ICD-9-CM) - Z86.010 (ICD-10-CM) - Personal history of colonic polyps 530.81 (ICD-9-CM) - K21.9 (ICD-10-CM) - Gastroesophageal reflux disease, unspecified whether esophagitis present 780.94 (ICD-9-CM) - R68.81 (ICD-10-CM) - Early satiety 783.21 (ICD-9-CM) - R63.4 (ICD-10-CM) - Weight loss.  The various methods of treatment have been discussed with the patient and family. After consideration of risks, benefits and other options for treatment, the patient has consented to  Procedure(s): COLONOSCOPY (N/A) ESOPHAGOGASTRODUODENOSCOPY (EGD) WITH PROPOFOL (N/A) as a surgical intervention.  The patient's history has been reviewed, patient examined, no change in status, stable for surgery.  I have reviewed the patient's chart and labs.  Questions were answered to the patient's satisfaction.     [name]

## 2023-04-01 NOTE — Transfer of Care (Signed)
Immediate Anesthesia Transfer of Care Note  Patient: Misty Briggs  Procedure(s) Performed: COLONOSCOPY ESOPHAGOGASTRODUODENOSCOPY (EGD) WITH PROPOFOL  Patient Location: PACU  Anesthesia Type:General  Level of Consciousness: drowsy  Airway & Oxygen Therapy: Patient Spontanous Breathing  Post-op Assessment: Report given to RN and Post -op Vital signs reviewed and stable  Post vital signs: stable  Last Vitals:  Vitals Value Taken Time  BP 96/47 04/01/23 1258  Temp 36.8 C 04/01/23 1255  Pulse 68 04/01/23 1259  Resp 18 04/01/23 1259  SpO2 96 % 04/01/23 1259  Vitals shown include unvalidated device data.  Last Pain:  Vitals:   04/01/23 1255  TempSrc: Temporal  PainSc: Asleep         Complications: No notable events documented.

## 2023-04-01 NOTE — Anesthesia Preprocedure Evaluation (Signed)
Anesthesia Evaluation  Patient identified by MRN, date of birth, ID band Patient awake    Reviewed: Allergy & Precautions, NPO status , Patient's Chart, lab work & pertinent test results  History of Anesthesia Complications (+) PONV and history of anesthetic complications  Airway Mallampati: III  TM Distance: <3 FB Neck ROM: full    Dental  (+) Chipped, Poor Dentition, Missing   Pulmonary shortness of breath and with exertion, asthma , COPD   Pulmonary exam normal        Cardiovascular hypertension, (-) angina + Past MI  Normal cardiovascular exam     Neuro/Psych negative neurological ROS  negative psych ROS   GI/Hepatic Neg liver ROS,GERD  Controlled,,  Endo/Other  diabetes, Type 2    Renal/GU negative Renal ROS  negative genitourinary   Musculoskeletal   Abdominal   Peds  Hematology negative hematology ROS (+)   Anesthesia Other Findings Past Medical History: No date: Arthritis No date: Asthma No date: COPD (chronic obstructive pulmonary disease) (HCC)     Comment:  Dr. Meredeth Ide, [facility] pulmonologist No date: Diabetes mellitus 02/28/2020: Fever of unknown origin No date: GERD (gastroesophageal reflux disease) No date: Heart murmur 11/03/2020: History of COVID-19 No date: HLD (hyperlipidemia) No date: Hypertension No date: Myocardial infarction [facility])     Comment:  mild 09/26/2019: Pneumonia due to COVID-19 virus No date: PONV (postoperative nausea and vomiting)     Comment:  itching sometimes No date: Rheumatoid arthritis(714.0) No date: Shortness of breath  Past Surgical History: 05/05/2012: ANTERIOR CERVICAL DECOMP/DISCECTOMY FUSION     Comment:  Procedure: ANTERIOR CERVICAL DECOMPRESSION/DISCECTOMY               FUSION 1 LEVEL;  Surgeon: Karn Cassis, MD;                Location: [facility] NEURO ORS;  Service: Neurosurgery;                Laterality: N/A;  Cervical six seven Anterior  cervical               decompression/diskectomy, Fusion, Plate 24/40/1027: COLONOSCOPY WITH PROPOFOL; N/A     Comment:  Procedure: COLONOSCOPY WITH PROPOFOL;  Surgeon:               Christena Deem, MD;  Location: [facility] ENDOSCOPY;                Service: Endoscopy;  Laterality: N/A; No date: ENDOMETRIAL ABLATION     Comment:  novasure 2015: FOOT SURGERY No date: ROTATOR CUFF REPAIR     Comment:  bilat 2010: TRANSTHORACIC ECHOCARDIOGRAM     Comment:  [facility] No date: TUBAL LIGATION  BMI    Body Mass Index: 23.00 kg/m      Reproductive/Obstetrics negative OB ROS                             Anesthesia Physical Anesthesia Plan  ASA: 3  Anesthesia Plan: General   Post-op Pain Management:    Induction: Intravenous  PONV Risk Score and Plan: Propofol infusion and TIVA  Airway Management Planned: Natural Airway and Nasal Cannula  Additional Equipment:   Intra-op Plan:   Post-operative Plan:   Informed Consent: I have reviewed the patients History and Physical, chart, labs and discussed the procedure including the risks, benefits and alternatives for the proposed anesthesia with the patient or authorized representative who has indicated his/her  understanding and acceptance.     Dental Advisory Given  Plan Discussed with: Anesthesiologist, CRNA and Surgeon  Anesthesia Plan Comments: (Patient consented for risks of anesthesia including but not limited to:  - adverse reactions to medications - risk of airway placement if required - damage to eyes, teeth, lips or other oral mucosa - nerve damage due to positioning  - sore throat or hoarseness - Damage to heart, brain, nerves, lungs, other parts of body or loss of life  Patient voiced understanding.)       Anesthesia Quick Evaluation

## 2023-04-01 NOTE — Anesthesia Postprocedure Evaluation (Signed)
Anesthesia Post Note  Patient: Misty Briggs  Procedure(s) Performed: COLONOSCOPY ESOPHAGOGASTRODUODENOSCOPY (EGD) WITH PROPOFOL  Patient location during evaluation: PACU Anesthesia Type: General Level of consciousness: awake Pain management: satisfactory to patient Vital Signs Assessment: post-procedure vital signs reviewed and stable Respiratory status: spontaneous breathing and respiratory function stable Cardiovascular status: blood pressure returned to baseline Anesthetic complications: no   There were no known notable events for this encounter.   Last Vitals:  Vitals:   04/01/23 1315 04/01/23 1325  BP: 102/71 117/60  Pulse:    Resp:    Temp:    SpO2:      Last Pain:  Vitals:   04/01/23 1325  TempSrc:   PainSc: 0-No pain                 VAN STAVEREN,Baudelia Schroepfer

## 2023-04-01 NOTE — Op Note (Signed)
[facility] Gastroenterology Patient Name: Misty Briggs Procedure Date: 04/01/2023 11:54 AM MRN: [id number] Account #: [id number] Date of Birth: Nov 20, 1951 Admit Type: Outpatient Age: 72 Room: [facility] ENDO ROOM 1 Gender: Female Note Status: Finalized Instrument Name: Upper Endoscope [id number] Procedure:             Upper GI endoscopy Indications:           Early satiety Providers:             [name] DO, DO Referring MD:          Lilla Shook (Referring MD) Medicines:             Monitored Anesthesia Care Complications:         No immediate complications. Estimated blood loss: None. Procedure:             Pre-Anesthesia Assessment:                        - Prior to the procedure, a History and Physical was                         performed, and patient medications and allergies were                         reviewed. The patient is competent. The risks and                         benefits of the procedure and the sedation options and                         risks were discussed with the patient. All questions                         were answered and informed consent was obtained.                         Patient identification and proposed procedure were                         verified by the physician, the nurse, the anesthetist                         and the technician in the endoscopy suite. Mental                         Status Examination: alert and oriented. Airway                         Examination: normal oropharyngeal airway and neck                         mobility. Respiratory Examination: clear to                         auscultation. CV Examination: RRR, no murmurs, no S3                         or S4. Prophylactic Antibiotics: The patient does not  require prophylactic antibiotics. Prior                         Anticoagulants: The patient has taken no anticoagulant                         or antiplatelet agents. ASA  Grade Assessment: III - A                         patient with severe systemic disease. After reviewing                         the risks and benefits, the patient was deemed in                         satisfactory condition to undergo the procedure. The                         anesthesia plan was to use monitored anesthesia care                         (MAC). Immediately prior to administration of                         medications, the patient was re-assessed for adequacy                         to receive sedatives. The heart rate, respiratory                         rate, oxygen saturations, blood pressure, adequacy of                         pulmonary ventilation, and response to care were                         monitored throughout the procedure. The physical                         status of the patient was re-assessed after the                         procedure.                        After obtaining informed consent, the endoscope was                         passed under direct vision. Throughout the procedure,                         the patient's blood pressure, pulse, and oxygen                         saturations were monitored continuously. The Endoscope                         was introduced through the mouth, and advanced to the  second part of duodenum. The upper GI endoscopy was                         accomplished without difficulty. The patient tolerated                         the procedure well. Findings:      The duodenal bulb, first portion of the duodenum and second portion of       the duodenum were normal. Estimated blood loss: none.      A small hiatal hernia was present. Estimated blood loss: none.      The entire examined stomach was normal. Estimated blood loss: none.      The Z-line was regular. Estimated blood loss: none.      Esophagogastric landmarks were identified: the gastroesophageal junction       was found at 33 cm from the  incisors.      A widely patent Schatzki ring was found at the gastroesophageal       junction. Estimated blood loss: none.      The exam of the esophagus was otherwise normal. Impression:            - Normal duodenal bulb, first portion of the duodenum                         and second portion of the duodenum.                        - Small hiatal hernia.                        - Normal stomach.                        - Z-line regular.                        - Esophagogastric landmarks identified.                        - Widely patent Schatzki ring.                        - No specimens collected. Recommendation:        - Patient has a contact number available for                         emergencies. The signs and symptoms of potential                         delayed complications were discussed with the patient.                         Return to normal activities tomorrow. Written                         discharge instructions were provided to the patient.                        - Discharge patient to home.                        -  Resume previous diet.                        - Continue present medications.                        - Repeat upper endoscopy PRN for retreatment.                        - Return to GI office as previously scheduled.                        - proceed with colonoscopy                        - The findings and recommendations were discussed with                         the patient. Procedure Code(s):     --- Professional ---                        [phone], Esophagogastroduodenoscopy, flexible,                         transoral; diagnostic, including collection of                         specimen(s) by brushing or washing, when performed                         (separate procedure) Diagnosis Code(s):     --- Professional ---                        K44.9, Diaphragmatic hernia without obstruction or                         gangrene                        K22.2,  Esophageal obstruction                        R68.81, Early satiety CPT copyright 2022 [employer]. All rights reserved. The codes documented in this report are preliminary and upon coder review may  be revised to meet current compliance requirements. Attending Participation:      I personally performed the entire procedure. Elfredia Nevins, DO [name] DO, DO 04/01/2023 12:04:32 PM This report has been signed electronically. Number of Addenda: 0 Note Initiated On: 04/01/2023 11:54 AM Estimated Blood Loss:  Estimated blood loss: none.      [facility]

## 2023-04-01 NOTE — Op Note (Signed)
[facility] Gastroenterology Patient Name: Misty Briggs Procedure Date: 04/01/2023 11:53 AM MRN: [id number] Account #: [id number] Date of Birth: Mar 19, 1951 Admit Type: Outpatient Age: 72 Room: [facility] ENDO ROOM 1 Gender: Female Note Status: Finalized Instrument Name: Colonoscope [id number] Procedure:             Colonoscopy Indications:           High risk colon cancer surveillance: Personal history                         of colonic polyps Providers:             [name] DO, DO Referring MD:          Lilla Shook (Referring MD) Medicines:             Monitored Anesthesia Care Complications:         No immediate complications. Estimated blood loss:                         Minimal. Procedure:             Pre-Anesthesia Assessment:                        - Prior to the procedure, a History and Physical was                         performed, and patient medications and allergies were                         reviewed. The patient is competent. The risks and                         benefits of the procedure and the sedation options and                         risks were discussed with the patient. All questions                         were answered and informed consent was obtained.                         Patient identification and proposed procedure were                         verified by the physician, the nurse, the anesthetist                         and the technician in the endoscopy suite. Mental                         Status Examination: alert and oriented. Airway                         Examination: normal oropharyngeal airway and neck                         mobility. Respiratory Examination: clear to  auscultation. CV Examination: RRR, no murmurs, no S3                         or S4. Prophylactic Antibiotics: The patient does not                         require prophylactic antibiotics. Prior                          Anticoagulants: The patient has taken no anticoagulant                         or antiplatelet agents. ASA Grade Assessment: III - A                         patient with severe systemic disease. After reviewing                         the risks and benefits, the patient was deemed in                         satisfactory condition to undergo the procedure. The                         anesthesia plan was to use monitored anesthesia care                         (MAC). Immediately prior to administration of                         medications, the patient was re-assessed for adequacy                         to receive sedatives. The heart rate, respiratory                         rate, oxygen saturations, blood pressure, adequacy of                         pulmonary ventilation, and response to care were                         monitored throughout the procedure. The physical                         status of the patient was re-assessed after the                         procedure.                        After obtaining informed consent, the colonoscope was                         passed under direct vision. Throughout the procedure,                         the patient's blood pressure, pulse, and oxygen  saturations were monitored continuously. The                         Colonoscope was introduced through the anus and                         advanced to the the cecum, identified by the                         appendiceal orifice, IC valve and transillumination.                         The colonoscopy was somewhat difficult due to a                         redundant colon and significant looping. Successful                         completion of the procedure was aided by straightening                         and shortening the scope to obtain bowel loop                         reduction, applying abdominal pressure and lavage. The                         patient tolerated  the procedure well. The quality of                         the bowel preparation was evaluated using the BBPS                         [facility] Bowel Preparation Scale) with scores of: Right                         Colon = 3, Transverse Colon = 3 and Left Colon = 3                         (entire mucosa seen well with no residual staining,                         small fragments of stool or opaque liquid). The total                         BBPS score equals 9. The ileocecal valve, appendiceal                         orifice, and rectum were photographed. Findings:      The perianal and digital rectal examinations were normal. Pertinent       negatives include normal sphincter tone.      Non-bleeding internal hemorrhoids were found during retroflexion. The       hemorrhoids were Grade I (internal hemorrhoids that do not prolapse).       Estimated blood loss: none.      Nine sessile polyps were found in the rectum (1), descending colon (3),  transverse colon (3) and ascending colon (2). The polyps were 3 to 7 mm       in size. These polyps were removed with a cold snare. Resection and       retrieval were complete. Estimated blood loss was minimal.      Eight sessile polyps were found in the descending colon (1), transverse       colon (2), ascending colon (4) and cecum (1). The polyps were 1 to 2 mm       in size. These polyps were removed with a jumbo cold forceps. Resection       and retrieval were complete. Estimated blood loss was minimal.      The exam was otherwise without abnormality on direct and retroflexion       views.      Retroflexion in the right colon was performed. Impression:            - Non-bleeding internal hemorrhoids.                        - Nine 3 to 7 mm polyps in the rectum, in the                         descending colon, in the transverse colon and in the                         ascending colon, removed with a cold snare. Resected                         and  retrieved.                        - Eight 1 to 2 mm polyps in the descending colon, in                         the transverse colon, in the ascending colon and in                         the cecum, removed with a jumbo cold forceps. Resected                         and retrieved.                        - The examination was otherwise normal on direct and                         retroflexion views. Recommendation:        - Patient has a contact number available for                         emergencies. The signs and symptoms of potential                         delayed complications were discussed with the patient.                         Return to normal activities tomorrow. Written  discharge instructions were provided to the patient.                        - Discharge patient to home.                        - Resume previous diet.                        - Continue present medications.                        - No aspirin, ibuprofen, naproxen, or other                         non-steroidal anti-inflammatory drugs for 5 days after                         polyp removal.                        - Await pathology results.                        - Repeat colonoscopy in 1 year for surveillance based                         on pathology results.                        - Return to GI office as previously scheduled.                        - The findings and recommendations were discussed with                         the patient.                        - The findings and recommendations were discussed with                         the patient's family. Procedure Code(s):     --- Professional ---                        [phone], Colonoscopy, flexible; with removal of                         tumor(s), polyp(s), or other lesion(s) by snare                         technique                        45380, 59, Colonoscopy, flexible; with biopsy, single                         or  multiple Diagnosis Code(s):     --- Professional ---                        Z86.010, Personal history of colonic polyps  D12.8, Benign neoplasm of rectum                        D12.4, Benign neoplasm of descending colon                        D12.3, Benign neoplasm of transverse colon (hepatic                         flexure or splenic flexure)                        D12.2, Benign neoplasm of ascending colon                        D12.0, Benign neoplasm of cecum                        K64.0, First degree hemorrhoids CPT copyright 2022 American Medical Association. All rights reserved. The codes documented in this report are preliminary and upon coder review may  be revised to meet current compliance requirements. Attending Participation:      I personally performed the entire procedure. Elfredia Nevins, DO [name] DO, DO 04/01/2023 12:59:46 PM This report has been signed electronically. Number of Addenda: 0 Note Initiated On: 04/01/2023 11:53 AM Scope Withdrawal Time: 0 hours 25 minutes 38 seconds  Total Procedure Duration: 0 hours 43 minutes 59 seconds  Estimated Blood Loss:  Estimated blood loss was minimal.      [facility]

## 2023-04-04 ENCOUNTER — Encounter: Payer: Self-pay | Admitting: Gastroenterology

## 2023-04-04 LAB — SURGICAL PATHOLOGY

## 2023-07-11 ENCOUNTER — Other Ambulatory Visit: Payer: Self-pay | Admitting: Obstetrics and Gynecology

## 2023-07-11 DIAGNOSIS — Z1231 Encounter for screening mammogram for malignant neoplasm of breast: Secondary | ICD-10-CM

## 2023-07-19 ENCOUNTER — Ambulatory Visit
Admission: RE | Admit: 2023-07-19 | Discharge: 2023-07-19 | Disposition: A | Discharge status: Home / Self Care | Payer: Medicare Other | Source: Ambulatory Visit | Attending: Obstetrics and Gynecology | Admitting: Obstetrics and Gynecology

## 2023-07-19 DIAGNOSIS — Z1231 Encounter for screening mammogram for malignant neoplasm of breast: Secondary | ICD-10-CM | POA: Insufficient documentation

## 2023-08-05 ENCOUNTER — Ambulatory Visit (HOSPITAL_BASED_OUTPATIENT_CLINIC_OR_DEPARTMENT_OTHER): Payer: Medicare Other | Admitting: Pulmonary Disease

## 2023-08-05 DIAGNOSIS — J4489 Other specified chronic obstructive pulmonary disease: Secondary | ICD-10-CM

## 2023-08-05 MED ORDER — BREZTRI AEROSPHERE 160-9-4.8 MCG/ACT IN AERO: 2.0000 | INHALATION_SPRAY | Freq: Two times a day (BID) | RESPIRATORY_TRACT | 11 refills | Status: DC

## 2023-08-05 NOTE — Progress Notes (Unsigned)
Subjective:   Misty Briggs GENDER: female DOB: [birth year], MRN: [id number]   HPI  No chief complaint on file.   Reason for Visit: Follow-up  Misty Briggs is 72 year old female never smoker with asthma-COPD, rheumatoid arthritis and hx covid pneumonia 09/2019 who presents for follow-up.  Synopsis: She was previously seen in [facility] in [place] by Dr. Jayme Briggs. Established care at [facility] in [place] in 2021.  08/24/22 Since our last visit, she is compliant with Misty Briggs and doing well. Rarely uses albuterol inhaler. No rescue inhaler use in 6 months. Last exacerbation was October 2022 with non-covid infection. Fall is usually her worse season with some hoarseness with her allergies. She is followed by [facility] in [place] and due for weekly shots. Compliant with oxygen nightly. Minimal cough.  01/31/23 She was previously using her husband's oxygen but he no longer has oxygen. She reports she is overall doing ok during the daytime. Compliant with Breztri. Rarely use albuterol. No exacerbations since our last visit.   Prior inhalers: Does not tolerate powder forms  Social History: Husband has COPD   Past Medical History:  Diagnosis Date   Arthritis    Asthma    COPD (chronic obstructive pulmonary disease) (HCC)    Dr. Meredeth Briggs, [facility] pulmonologist   Diabetes mellitus    Fever of unknown origin 02/28/2020   GERD (gastroesophageal reflux disease)    Heart murmur    History of COVID-19 11/03/2020   HLD (hyperlipidemia)    Hypertension    Myocardial infarction (HCC)    mild   Pneumonia due to COVID-19 virus 09/26/2019   PONV (postoperative nausea and vomiting)    itching sometimes   Rheumatoid arthritis(714.0)    Shortness of breath      Allergies  Allergen Reactions   Doxycycline Other (See Comments)    Thrush   Penicillins Hives, Palpitations and Rash   Azithromycin Other (See Comments)    Thrush    Levofloxacin Other (See Comments)    Joint pain/muscle pain   Sulfa Antibiotics Rash     Outpatient Medications Prior to Visit  Medication Sig Dispense Refill   albuterol (VENTOLIN HFA) 108 (90 Base) MCG/ACT inhaler Inhale 1-2 puffs into the lungs every 6 (six) hours as needed for wheezing or shortness of breath.     amLODipine (NORVASC) 5 MG tablet Take 1 tablet (5 mg total) by mouth daily. 90 tablet 0   Ascorbic Acid (VITAMIN C) 1000 MG tablet Take 1,000 mg by mouth daily.     aspirin EC 81 MG tablet Take by mouth daily.      Budeson-Glycopyrrol-Formoterol (BREZTRI AEROSPHERE) 160-9-4.8 MCG/ACT AERO Inhale 2 puffs into the lungs in the morning and at bedtime. 10.7 g 6   calcium carbonate (OS-CAL - DOSED IN MG OF ELEMENTAL CALCIUM) 1250 (500 Ca) MG tablet Take 2 tablets by mouth daily with breakfast.     Coenzyme Q10 (CO Q-10 PO) Take by mouth daily.     EPINEPHrine 0.3 mg/0.3 mL IJ SOAJ injection Inject 0.3 mLs into the muscle once as needed for anaphylaxis.     ferrous sulfate 325 (65 FE) MG tablet Take 1 tablet (325 mg total) by mouth 2 (two) times daily with a meal. 60 tablet 0   fluticasone (FLONASE) 50 MCG/ACT nasal spray Place 1 spray into both nostrils daily as needed for allergies or rhinitis.     furosemide (LASIX) 20 MG tablet Take 20 mg by mouth daily.  gabapentin (NEURONTIN) 100 MG capsule Take 1 capsule by mouth at bedtime.     hydroxychloroquine (PLAQUENIL) 200 MG tablet Alternate daily with 1 tablet then the next day take 2 tablets     hydrOXYzine (ATARAX/VISTARIL) 25 MG tablet Take 25 mg by mouth every 4 (four) hours as needed for anxiety or itching.     levocetirizine (XYZAL) 5 MG tablet Take 5 mg by mouth every evening.     lovastatin (MEVACOR) 20 MG tablet Take 20 mg by mouth at bedtime.     magnesium gluconate (MAGONATE) 500 MG tablet Take 500 mg by mouth daily.     metFORMIN (GLUCOPHAGE) 1000 MG tablet Take 1 tablet (1,000 mg total) by mouth 2 (two) times daily with  a meal. 60 tablet 0   metoprolol tartrate (LOPRESSOR) 50 MG tablet Take 50 mg by mouth 2 (two) times daily.     montelukast (SINGULAIR) 10 MG tablet 1 tablet in the evening     Multiple Vitamin (MULTIVITAMIN) capsule Take 1 capsule by mouth daily.     omeprazole (PRILOSEC) 20 MG capsule Take 20 mg by mouth daily.     potassium chloride (KLOR-CON) 10 MEQ tablet Take 10 mEq by mouth daily.     vitamin B-12 (CYANOCOBALAMIN) 1000 MCG tablet Take 1,000 mcg by mouth daily.     No facility-administered medications prior to visit.    Review of Systems  Constitutional:  Negative for chills, diaphoresis, fever, malaise/fatigue and weight loss.  HENT:  Negative for congestion.   Respiratory:  Negative for cough, hemoptysis, sputum production, shortness of breath and wheezing.   Cardiovascular:  Negative for chest pain, palpitations and leg swelling.    Objective:   There were no vitals filed for this visit.     Physical Exam: General: Well-appearing, no acute distress HENT: [place], AT Eyes: EOMI, no scleral icterus Respiratory: Clear to auscultation bilaterally.  No crackles, wheezing or rales Cardiovascular: RRR, -M/R/G, no JVD Extremities:-Edema,-tenderness Neuro: AAO x4, CNII-XII grossly intact Psych: Normal mood, normal affect  Data Reviewed:  Imaging: CT Chest 03/02/20 - Subsegemental atelectasis. Bilateral pleural effusions. Unchanged ground glass opacities.  CXR 10/29/20 - Chronic interstitial prominance CXR 09/15/21 - Chronic bronchitis changed. No infiltrate, effusion or edema  PFT: 12/02/20 FVC 2.56 (77%) FEV1 2.12 (84%) Ratio 82  TLC 80% DLCO 73% Interpretation: No obstructive or restrictive defect is present. No significant bronchodilator response. Isolated reduced DLCO may be suggestive of early ILD or PVD  ONO: 08/06/2020 showed the Misty spent 9.4 minutes below 88%, basal SPO2 was 93.4%.   Assessment & Plan:   Discussion: 72 year old female with asthma-COPD,  nocturnal hypoxemia, allergic rhinitis on weekly allergy shots, GERD, RA and HLD who presents for follow-up.  Symptoms overall well-controlled on triple therapy however no longer has access to oxygen (previously was using her husband's device close). Discussed clinical course and management of COPD/asthma including bronchodilator regimen, preventive care including vaccinations and action plan for exacerbation.  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 Epi pen PRN  Rheumatoid Arthritis Previously on methotrexate --Plaquenil  Osteoporosis --Recommend minimizing steroid use if able  Health Maintenance Immunization History  Administered Date(s) Administered   Fluad Quad(high Dose 65+) 07/20/2019, 08/19/2020   Influenza, High Dose  Seasonal PF 12/16/2016, 08/30/2017, 09/11/2019, 02/07/2020, 02/12/2021, 02/18/2022   Influenza,inj,Quad PF,6-35 Mos 08/13/2022   Influenza-Unspecified 08/22/2020, 08/06/2021   Moderna Sars-Covid-2 Vaccination 06/18/2020, 07/14/2020, 02/12/2021   Pneumococcal Conjugate-13 01/23/2019   Pneumococcal Polysaccharide-23 12/16/2016, 08/30/2017, 08/24/2018, 09/11/2019, 02/07/2020, 09/25/2020, 02/12/2021, 02/18/2022   Zoster Recombinant(Shingrix) 04/13/2019, 06/27/2019   CT Lung Screen - not qualified  No orders of the defined types were placed in this encounter.  No orders of the defined types were placed in this encounter.  No follow-ups on file.  I have spent a total time of 32-minutes on the day of the appointment including chart review, data review, collecting history, coordinating care and discussing medical diagnosis and plan with the  Misty/family. Past medical history, allergies, medications were reviewed. Pertinent imaging, labs and tests included in this note have been reviewed and interpreted independently by me.  Elease Swarm Mechele Collin, MD [facility] Office Number [phone]

## 2023-08-05 NOTE — Patient Instructions (Signed)
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

## 2023-08-08 ENCOUNTER — Encounter (HOSPITAL_BASED_OUTPATIENT_CLINIC_OR_DEPARTMENT_OTHER): Payer: Self-pay | Admitting: Pulmonary Disease

## 2023-08-15 ENCOUNTER — Other Ambulatory Visit (HOSPITAL_BASED_OUTPATIENT_CLINIC_OR_DEPARTMENT_OTHER): Payer: Self-pay

## 2023-08-15 MED ORDER — BREZTRI AEROSPHERE 160-9-4.8 MCG/ACT IN AERO: 2.0000 | INHALATION_SPRAY | Freq: Two times a day (BID) | RESPIRATORY_TRACT | 1 refills | Status: DC

## 2023-09-22 ENCOUNTER — Other Ambulatory Visit (HOSPITAL_BASED_OUTPATIENT_CLINIC_OR_DEPARTMENT_OTHER): Payer: Self-pay

## 2023-09-22 MED ORDER — BREZTRI AEROSPHERE 160-9-4.8 MCG/ACT IN AERO: 2.0000 | INHALATION_SPRAY | Freq: Two times a day (BID) | RESPIRATORY_TRACT | 1 refills | Status: DC

## 2024-02-02 ENCOUNTER — Encounter (HOSPITAL_BASED_OUTPATIENT_CLINIC_OR_DEPARTMENT_OTHER): Payer: Self-pay | Admitting: Pulmonary Disease

## 2024-02-02 ENCOUNTER — Ambulatory Visit (HOSPITAL_BASED_OUTPATIENT_CLINIC_OR_DEPARTMENT_OTHER): Payer: Medicare Other | Admitting: Pulmonary Disease

## 2024-02-02 VITALS — BP 110/70 | HR 80 | Ht 65.0 in | Wt 158.7 lb

## 2024-02-02 DIAGNOSIS — G4734 Idiopathic sleep related nonobstructive alveolar hypoventilation: Secondary | ICD-10-CM | POA: Diagnosis not present

## 2024-02-02 DIAGNOSIS — J4489 Other specified chronic obstructive pulmonary disease: Secondary | ICD-10-CM | POA: Diagnosis not present

## 2024-02-02 MED ORDER — ALBUTEROL SULFATE HFA 108 (90 BASE) MCG/ACT IN AERS: 1.0000 | INHALATION_SPRAY | Freq: Four times a day (QID) | RESPIRATORY_TRACT | 1 refills | Status: AC | PRN

## 2024-02-02 MED ORDER — CEFPODOXIME PROXETIL 200 MG PO TABS: 200.0000 mg | ORAL_TABLET | Freq: Two times a day (BID) | ORAL | 0 refills | Status: AC

## 2024-02-02 MED ORDER — PREDNISONE 10 MG PO TABS: ORAL_TABLET | ORAL | 0 refills | Status: AC

## 2024-02-02 NOTE — Progress Notes (Signed)
 Subjective:   PATIENT ID: Misty Briggs GENDER: female DOB: [birth year], MRN: [id number]   HPI  Chief Complaint  Patient presents with   Follow-up    Asthma, COPD    Reason for Visit: Follow-up  [name] is 73 year old female never smoker with asthma-COPD, rheumatoid arthritis and hx covid pneumonia 09/2019 who presents for follow-up.  Synopsis: She was previously seen in [facility] in [place] by Dr. Jayme Cloud. Established care at [facility] in [place] in 2021. Followed by [facility] in [place] for weekly shots. Fall is usually her worse season with some hoarseness with her allergies. Compliant with oxygen nightly.   02/02/24 Since our last visit she was previously well controlled on Breztri and nocturnal O2. Recently went to a baby shower. She reports worsening leg swelling two weeks ago. Began having shortness of breath, productive cough, nasal congestion and low grade fevers. Son-in-law with covid. She usually gets sick at this time of the year (fall/early spring). Compliant with O2 nightly and considering switching DME providers.  Prior inhalers: Does not tolerate powder forms  Social History: Husband has COPD   Past Medical History:  Diagnosis Date   Arthritis    Asthma    COPD (chronic obstructive pulmonary disease) (HCC)    Dr. Meredeth Ide, [facility] pulmonologist   Diabetes mellitus    Fever of unknown origin 02/28/2020   GERD (gastroesophageal reflux disease)    Heart murmur    History of COVID-19 11/03/2020   HLD (hyperlipidemia)    Hypertension    Myocardial infarction (HCC)    mild   Pneumonia due to COVID-19 virus 09/26/2019   PONV (postoperative nausea and vomiting)    itching sometimes   Rheumatoid arthritis(714.0)    Shortness of breath      Allergies  Allergen Reactions   Doxycycline Other (See Comments)    Thrush   Penicillins Hives, Palpitations and Rash   Azithromycin Other (See Comments)    Thrush    Levofloxacin Other (See Comments)    Joint pain/muscle pain   Sulfa Antibiotics Rash     Outpatient Medications Prior to Visit  Medication Sig Dispense Refill   albuterol (VENTOLIN HFA) 108 (90 Base) MCG/ACT inhaler Inhale 1-2 puffs into the lungs every 6 (six) hours as needed for wheezing or shortness of breath.     Ascorbic Acid (VITAMIN C) 1000 MG tablet Take 1,000 mg by mouth daily.     aspirin EC 81 MG tablet Take by mouth daily.      Budeson-Glycopyrrol-Formoterol (BREZTRI AEROSPHERE) 160-9-4.8 MCG/ACT AERO Inhale 2 puffs into the lungs in the morning and at bedtime. 32.1 g 1   calcium carbonate (OS-CAL - DOSED IN MG OF ELEMENTAL CALCIUM) 1250 (500 Ca) MG tablet Take 2 tablets by mouth daily with breakfast.     Coenzyme Q10 (CO Q-10 PO) Take by mouth daily.     EPINEPHrine 0.3 mg/0.3 mL IJ SOAJ injection Inject 0.3 mLs into the muscle once as needed for anaphylaxis.     fluticasone (FLONASE) 50 MCG/ACT nasal spray Place 1 spray into both nostrils daily as needed for allergies or rhinitis.     furosemide (LASIX) 20 MG tablet Take 20 mg by mouth daily.      gabapentin (NEURONTIN) 100 MG capsule Take 1 capsule by mouth at bedtime.     hydroxychloroquine (PLAQUENIL) 200 MG tablet Alternate daily with 1 tablet then the next day take 2 tablets     hydrOXYzine (ATARAX/VISTARIL) 25  MG tablet Take 25 mg by mouth every 4 (four) hours as needed for anxiety or itching.     levocetirizine (XYZAL) 5 MG tablet Take 5 mg by mouth every evening.     lovastatin (MEVACOR) 20 MG tablet Take 20 mg by mouth at bedtime.     magnesium gluconate (MAGONATE) 500 MG tablet Take 500 mg by mouth daily.     metoprolol tartrate (LOPRESSOR) 50 MG tablet Take 50 mg by mouth 2 (two) times daily.     montelukast (SINGULAIR) 10 MG tablet 1 tablet in the evening     Multiple Vitamin (MULTIVITAMIN) capsule Take 1 capsule by mouth daily.     omeprazole (PRILOSEC) 20 MG capsule Take 20 mg by mouth daily.     potassium  chloride (KLOR-CON) 10 MEQ tablet Take 10 mEq by mouth daily.     vitamin B-12 (CYANOCOBALAMIN) 1000 MCG tablet Take 1,000 mcg by mouth daily.     amLODipine (NORVASC) 5 MG tablet Take 1 tablet (5 mg total) by mouth daily. 90 tablet 0   ferrous sulfate 325 (65 FE) MG tablet Take 1 tablet (325 mg total) by mouth 2 (two) times daily with a meal. 60 tablet 0   metFORMIN (GLUCOPHAGE) 1000 MG tablet Take 1 tablet (1,000 mg total) by mouth 2 (two) times daily with a meal. 60 tablet 0   No facility-administered medications prior to visit.    Review of Systems  Constitutional:  Negative for chills, diaphoresis, fever, malaise/fatigue and weight loss.  HENT:  Positive for congestion.   Respiratory:  Positive for cough and sputum production. Negative for hemoptysis, shortness of breath and wheezing.   Cardiovascular:  Negative for chest pain, palpitations and leg swelling.    Objective:   Vitals:   02/02/24 1124  BP: 110/70  Pulse: 80  SpO2: 97%  Weight: 158 lb 11.2 oz (72 kg)  Height: 5\' 5"  (1.651 m)    SpO2: 97 %  Physical Exam: General: Well-appearing, no acute distress HENT: [place], AT Eyes: EOMI, no scleral icterus Respiratory: Mild rhonchi to auscultation bilaterally.  No wheezing or rales Cardiovascular: RRR, -M/R/G, no JVD Extremities:-Edema,-tenderness Neuro: AAO x4, CNII-XII grossly intact Psych: Normal mood, normal affect   Data Reviewed:  Imaging: CT Chest 03/02/20 - Subsegemental atelectasis. Bilateral pleural effusions. Unchanged ground glass opacities.  CXR 10/29/20 - Chronic interstitial prominance CXR 09/15/21 - Chronic bronchitis changed. No infiltrate, effusion or edema  PFT: 12/02/20 FVC 2.56 (77%) FEV1 2.12 (84%) Ratio 82  TLC 80% DLCO 73% Interpretation: No obstructive or restrictive defect is present. No significant bronchodilator response. Isolated reduced DLCO may be suggestive of early ILD or PVD  ONO: 08/06/2020 showed the patient spent 9.4 minutes below  88%, basal SPO2 was 93.4%. 02/24/23 SpO2 < 88% 30 min and 44 seconds. Nadir SpO2 65%  Assessment & Plan:   Discussion: 73 year old female with asthma-COPD, nocturnal hypoxemia, allergic rhinitis on weekly allergy shots, GERD, RA and HLD who presents for follow-up. Currently in exacerbation, before this >1 year ago. Usually well controlled respiratory symptoms on Breztri. Discussed clinical course and management of COPD/asthma including bronchodilator regimen, preventive care and action plan for exacerbation.  Asthma-COPD, currently in exacerbation --Prednisone taper --Cefpodoxime 200 mg BID x 5 days (multiple allergies) --CONTINUE Breztri TWO puffs TWICE a day.  --CONTINUE Albuterol as needed for shortness of breath or wheezing. 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  Will schedule clinic visit in April. OK to overbook. Will need to be retested for recertification in April 2025 --CONTINUE 2L O2 via [place] with sleep   Allergy --Weekly allergy shots. Epi pen PRN  Rheumatoid Arthritis Previously on methotrexate --Plaquenil  Osteoporosis --Recommend minimizing steroid use if able  Health Maintenance Immunization History  Administered Date(s) Administered   Fluad Quad(high Dose 65+) 07/20/2019, 08/19/2020   Influenza, High Dose Seasonal PF 12/16/2016, 08/30/2017, 09/11/2019, 02/07/2020, 02/12/2021, 02/18/2022   Influenza,inj,Quad PF,6-35 Mos 08/13/2022   Influenza-Unspecified 08/22/2020, 08/06/2021   Moderna Sars-Covid-2 Vaccination 06/18/2020, 07/14/2020, 02/12/2021   Pneumococcal Conjugate-13 01/23/2019   Pneumococcal Polysaccharide-23 12/16/2016, 08/30/2017, 08/24/2018, 09/11/2019, 02/07/2020, 09/25/2020, 02/12/2021, 02/18/2022   Zoster Recombinant(Shingrix) 04/13/2019, 06/27/2019   CT Lung Screen - not qualified  No orders of the defined types were placed in  this encounter.  Meds ordered this encounter  Medications   predniSONE (DELTASONE) 10 MG tablet    Sig: Take 4 tablets (40 mg total) by mouth daily with breakfast for 2 days, THEN 3 tablets (30 mg total) daily with breakfast for 2 days, THEN 2 tablets (20 mg total) daily with breakfast for 2 days, THEN 1 tablet (10 mg total) daily with breakfast for 2 days.    Dispense:  20 tablet    Refill:  0   cefpodoxime (VANTIN) 200 MG tablet    Sig: Take 1 tablet (200 mg total) by mouth 2 (two) times daily for 5 days.    Dispense:  10 tablet    Refill:  0   albuterol (VENTOLIN HFA) 108 (90 Base) MCG/ACT inhaler    Sig: Inhale 1-2 puffs into the lungs every 6 (six) hours as needed for wheezing or shortness of breath.    Dispense:  8 g    Refill:  1   Return for Will schedule clinic visit in April. OK to overbook..  I have spent a total time of 35-minutes on the day of the appointment including chart review, data review, collecting history, coordinating care and discussing medical diagnosis and plan with the patient/family. Past medical history, allergies, medications were reviewed. Pertinent imaging, labs and tests included in this note have been reviewed and interpreted independently by me.  Evans Levee Mechele Collin, MD [facility]

## 2024-02-02 NOTE — Patient Instructions (Signed)
  Asthma-COPD, currently in exacerbation --Prednisone taper --Cefpodoxime 200 mg BID x 5 days (multiple allergies) --CONTINUE Breztri TWO puffs TWICE a day.  --CONTINUE Albuterol as needed for shortness of breath or wheezing. 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 Will schedule clinic visit in April. OK to overbook. Will need to be retested for recertification in April 2025 --CONTINUE 2L O2 via [place] with sleep

## 2024-03-01 ENCOUNTER — Ambulatory Visit (HOSPITAL_BASED_OUTPATIENT_CLINIC_OR_DEPARTMENT_OTHER): Admitting: Pulmonary Disease

## 2024-03-01 ENCOUNTER — Ambulatory Visit (HOSPITAL_BASED_OUTPATIENT_CLINIC_OR_DEPARTMENT_OTHER)

## 2024-03-01 ENCOUNTER — Ambulatory Visit: Payer: Self-pay

## 2024-03-01 ENCOUNTER — Encounter (HOSPITAL_BASED_OUTPATIENT_CLINIC_OR_DEPARTMENT_OTHER): Payer: Self-pay | Admitting: Pulmonary Disease

## 2024-03-01 VITALS — BP 122/78 | HR 81 | Ht 65.0 in | Wt 158.4 lb

## 2024-03-01 DIAGNOSIS — J218 Acute bronchiolitis due to other specified organisms: Secondary | ICD-10-CM

## 2024-03-01 DIAGNOSIS — B9789 Other viral agents as the cause of diseases classified elsewhere: Secondary | ICD-10-CM

## 2024-03-01 DIAGNOSIS — J4541 Moderate persistent asthma with (acute) exacerbation: Secondary | ICD-10-CM

## 2024-03-01 MED ORDER — PREDNISONE 10 MG PO TABS: ORAL_TABLET | ORAL | 0 refills | Status: DC

## 2024-03-01 MED ORDER — ALBUTEROL SULFATE (2.5 MG/3ML) 0.083% IN NEBU: INHALATION_SOLUTION | RESPIRATORY_TRACT | 0 refills | Status: AC

## 2024-03-01 MED ORDER — AZITHROMYCIN 250 MG PO TABS: 250.0000 mg | ORAL_TABLET | Freq: Every day | ORAL | 0 refills | Status: DC

## 2024-03-01 NOTE — Progress Notes (Signed)
 Subjective:    Patient ID: Misty Briggs, female    DOB: 07/14/1951, 73 y.o.   MRN: [id number]  HPI  73 year old  never smoker with mod persistent asthma for FU  Pt of dr Everardo All now, seen by me in 2020   PMH : rheumatoid arthritis - Previously on methotrexate , now on Plaquenil & gabapentin  covid pneumonia 09/2019   The patient, with a history of rheumatoid arthritis and asthma, presents with a recurrence of respiratory symptoms. She reports a productive cough with clear sputum, nasal congestion, and chest tightness. She has been experiencing these symptoms since Sunday, and she has progressively worsened. She has also been running a fever since Monday, with a peak temperature of 102.53F. She has been using her albuterol pump and Breztri inhaler twice daily, but her symptoms have not improved. She has a nebulizer at home but has not used it recently. She also reports a sore throat, which she attributes to her persistent cough. She denies recent exposure to sick contacts. She has a history of COVID-19 infection in 2019, and she has been vaccinated for the flu and RSV. She also has a history of allergies, which she reports have been particularly severe this season.  Significant tests/ events reviewed  CT Chest 03/02/20 - Subsegemental atelectasis. Bilateral pleural effusions. Unchanged ground glass opacities.     PFT: 12/02/20 FVC 2.56 (77%) FEV1 2.12 (84%) Ratio 82  TLC 80% DLCO 73% Interpretation: No obstructive or restrictive defect is present. No significant bronchodilator response. Isolated reduced DLCO may be suggestive of early ILD or PVD   ONO: 08/06/2020 showed the patient spent 9.4 minutes below 88%, basal SPO2 was 93.4%. 02/24/23 SpO2 < 88% 30 min and 44 seconds. Nadir SpO2 65%  Review of Systems neg for any significant sore throat, dysphagia, itching, sneezing, nasal congestion or excess/ purulent secretions, fever, chills, sweats, unintended wt loss, pleuritic or exertional cp,  hempoptysis, orthopnea pnd or change in chronic leg swelling. Also denies presyncope, palpitations, heartburn, abdominal pain, nausea, vomiting, diarrhea or change in bowel or urinary habits, dysuria,hematuria, rash, arthralgias, visual complaints, headache, numbness weakness or ataxia.     Objective:   Physical Exam  Gen. Pleasant, well-nourished, in no distress ENT - no thrush, no pallor/icterus,no post nasal drip Neck: No JVD, no thyromegaly, no carotid bruits Lungs: no use of accessory muscles, no dullness to percussion, BL diffuse rhonchi  Cardiovascular: Rhythm regular, heart sounds  normal, no murmurs or gallops, no peripheral edema Musculoskeletal: No deformities, no cyanosis or clubbing         Assessment & Plan:   Asthma exacerbation Recurrent asthma exacerbation with wheezing, cough, and chest tightness. Fever up to 102.53F suggests possible viral infection. Coughing up clear sputum, no green or yellow phlegm. Asthma managed with Breztri and albuterol inhaler. Nebulizer available but not recently used. Prednisone is needed to control inflammation and prevent further exacerbation. Nebulizer use is recommended for effective bronchodilator delivery. - Prescribe prednisone for asthma exacerbation. - Instruct to use nebulizer twice daily over the weekend. - Continue Breztri twice daily and albuterol inhaler as needed. - Recommend using plain Delsym for cough management.  Fever of unknown origin Fever since Monday with temperatures reaching 102.53F. Differential includes infection versus asthma or allergy exacerbation. No evidence of bacterial infection as sputum is clear. Antibiotic prescription is contingent on development of green or yellow phlegm, indicating possible bacterial infection. - Order chest x-ray to evaluate for possible infection. - Send for COVID and  flu testing in the lab or @ home. - Prescribe an antibiotic to be used if phlegm turns green or yellow, pending lab  results.  Rheumatoid arthritis Chronic condition managed with Plaquenil, previously on methotrexate. Concerns about immunosuppression and potential interactions with current respiratory symptoms. Methotrexate is avoided due to its immunosuppressive effects, which could exacerbate respiratory infections. - Monitor for any worsening of symptoms or need for adjustment in rheumatoid arthritis management.

## 2024-03-01 NOTE — Progress Notes (Signed)
 Pt.notified

## 2024-03-01 NOTE — Telephone Encounter (Addendum)
 Pt reports she began with similar symptoms as previous illness on Sunday. She notes clear productive cough, intermittent fever, mild SOB and wheezing in chest. Pt reports home health nurse on Tuesday noted bilateral wheezing. OV scheduled today. This RN educated pt on home care, new-worsening symptoms, when to call back/seek emergent care. Pt verbalized understanding and agrees to plan.    E2C2 Pulmonary Triage - Initial Assessment Questions "Chief Complaint (e.g., cough, sob, wheezing, fever, chills, sweat or additional symptoms) *Go to specific symptom protocol after initial questions. Productive cough, clear sputum, wheezing in chest  "How long have symptoms been present?" Sunday  Have you tested for COVID or Flu? Note: If not, ask patient if a home test can be taken. If so, instruct patient to call back for positive results. No  MEDICINES:   "Have you used any OTC meds to help with symptoms?" No If yes, ask "What medications?" NA  "Have you used your inhalers/maintenance medication?" Yes If yes, "What medications?" Breztri BID Albuterol If inhaler, ask "How many puffs and how often?" Note: Review instructions on medication in the chart. 2 puffs today  OXYGEN: "Do you wear supplemental oxygen?"  YES If yes, "How many liters are you supposed to use?" 2 LPM at bedtime  "Do you monitor your oxygen levels?" Yes If yes, "What is your reading (oxygen level) today?" NA  "What is your usual oxygen saturation reading?"  (Note: Pulmonary O2 sats should be 90% or greater) 93-97%   1st attempt, "call cannot be completed as dialed message"   Copied from CRM [id number]. Topic: Clinical - Medication Question >> Mar 01, 2024 10:30 AM Brennan Bailey S wrote: Reason for CRM: patient is calling because she is not feeling well, she states her condition is still the same, she has an appointment on the 24th but she needs the antibiotic medicine and steroids dr. Everardo All gave her last time. Please call  patient twice.

## 2024-03-01 NOTE — Telephone Encounter (Signed)
 Reason for Disposition . Wheezing is present  Answer Assessment - Initial Assessment Questions 1. ONSET: "When did the cough begin?"      Sunday  3. SPUTUM: "Describe the color of your sputum" (none, dry cough; clear, white, yellow, green)     Clear 4. HEMOPTYSIS: "Are you coughing up any blood?" If so ask: "How much?" (flecks, streaks, tablespoons, etc.)     None 5. DIFFICULTY BREATHING: "Are you having difficulty breathing?" If Yes, ask: "How bad is it?" (e.g., mild, moderate, severe)    - MILD: No SOB at rest, mild SOB with walking, speaks normally in sentences, can lie down, no retractions, pulse < 100.    - MODERATE: SOB at rest, SOB with minimal exertion and prefers to sit, cannot lie down flat, speaks in phrases, mild retractions, audible wheezing, pulse 100-120.    - SEVERE: Very SOB at rest, speaks in single words, struggling to breathe, sitting hunched forward, retractions, pulse > 120      Mild 6. FEVER: "Do you have a fever?" If Yes, ask: "What is your temperature, how was it measured, and when did it start?"     Yes 100.0 7. CARDIAC HISTORY: "Do you have any history of heart disease?" (e.g., heart attack, congestive heart failure)      None 8. LUNG HISTORY: "Do you have any history of lung disease?"  (e.g., pulmonary embolus, asthma, emphysema)     Asthma, COPD  10. OTHER SYMPTOMS: "Do you have any other symptoms?" (e.g., runny nose, wheezing, chest pain)       Wheezing, runny nose  Protocols used: Cough - Acute Productive-A-AH

## 2024-03-01 NOTE — Patient Instructions (Addendum)
 Prednisone 10 mg tabs Take 4 tabs  daily with food x 4 days, then 3 tabs daily x 4 days, then 2 tabs daily x 4 days, then 1 tab daily x4 days then stop. #40  Albuterol nebs bid x 3-5 days until better  Zpak if phlegm turns yellow/green  Tussin-DM as needed  Covid testing @ home ok  CXR today

## 2024-03-06 ENCOUNTER — Other Ambulatory Visit: Payer: Self-pay

## 2024-03-06 ENCOUNTER — Encounter (HOSPITAL_COMMUNITY): Payer: Self-pay | Admitting: Emergency Medicine

## 2024-03-06 ENCOUNTER — Emergency Department (HOSPITAL_COMMUNITY)

## 2024-03-06 ENCOUNTER — Inpatient Hospital Stay (HOSPITAL_COMMUNITY)
Admission: EM | Admit: 2024-03-06 | Discharge: 2024-03-10 | DRG: 193 | Disposition: A | Discharge status: Home / Self Care | Attending: Internal Medicine | Admitting: Internal Medicine

## 2024-03-06 DIAGNOSIS — K219 Gastro-esophageal reflux disease without esophagitis: Secondary | ICD-10-CM | POA: Diagnosis present

## 2024-03-06 DIAGNOSIS — Z981 Arthrodesis status: Secondary | ICD-10-CM

## 2024-03-06 DIAGNOSIS — N1832 Chronic kidney disease, stage 3b: Secondary | ICD-10-CM | POA: Diagnosis present

## 2024-03-06 DIAGNOSIS — J45901 Unspecified asthma with (acute) exacerbation: Secondary | ICD-10-CM | POA: Diagnosis present

## 2024-03-06 DIAGNOSIS — Z79899 Other long term (current) drug therapy: Secondary | ICD-10-CM

## 2024-03-06 DIAGNOSIS — J9621 Acute and chronic respiratory failure with hypoxia: Secondary | ICD-10-CM | POA: Diagnosis present

## 2024-03-06 DIAGNOSIS — D72829 Elevated white blood cell count, unspecified: Secondary | ICD-10-CM | POA: Diagnosis not present

## 2024-03-06 DIAGNOSIS — E785 Hyperlipidemia, unspecified: Secondary | ICD-10-CM | POA: Diagnosis present

## 2024-03-06 DIAGNOSIS — I2510 Atherosclerotic heart disease of native coronary artery without angina pectoris: Secondary | ICD-10-CM | POA: Diagnosis present

## 2024-03-06 DIAGNOSIS — J449 Chronic obstructive pulmonary disease, unspecified: Secondary | ICD-10-CM | POA: Diagnosis present

## 2024-03-06 DIAGNOSIS — Z7982 Long term (current) use of aspirin: Secondary | ICD-10-CM | POA: Diagnosis not present

## 2024-03-06 DIAGNOSIS — G8929 Other chronic pain: Secondary | ICD-10-CM | POA: Diagnosis present

## 2024-03-06 DIAGNOSIS — I252 Old myocardial infarction: Secondary | ICD-10-CM

## 2024-03-06 DIAGNOSIS — E1122 Type 2 diabetes mellitus with diabetic chronic kidney disease: Secondary | ICD-10-CM | POA: Diagnosis present

## 2024-03-06 DIAGNOSIS — J189 Pneumonia, unspecified organism: Secondary | ICD-10-CM

## 2024-03-06 DIAGNOSIS — J441 Chronic obstructive pulmonary disease with (acute) exacerbation: Principal | ICD-10-CM

## 2024-03-06 DIAGNOSIS — Z6825 Body mass index (BMI) 25.0-25.9, adult: Secondary | ICD-10-CM

## 2024-03-06 DIAGNOSIS — Z8616 Personal history of COVID-19: Secondary | ICD-10-CM | POA: Diagnosis not present

## 2024-03-06 DIAGNOSIS — B348 Other viral infections of unspecified site: Secondary | ICD-10-CM | POA: Diagnosis present

## 2024-03-06 DIAGNOSIS — Z1152 Encounter for screening for COVID-19: Secondary | ICD-10-CM

## 2024-03-06 DIAGNOSIS — E1140 Type 2 diabetes mellitus with diabetic neuropathy, unspecified: Secondary | ICD-10-CM | POA: Diagnosis present

## 2024-03-06 DIAGNOSIS — E8809 Other disorders of plasma-protein metabolism, not elsewhere classified: Secondary | ICD-10-CM | POA: Diagnosis present

## 2024-03-06 DIAGNOSIS — Z7984 Long term (current) use of oral hypoglycemic drugs: Secondary | ICD-10-CM | POA: Diagnosis not present

## 2024-03-06 DIAGNOSIS — E663 Overweight: Secondary | ICD-10-CM | POA: Diagnosis present

## 2024-03-06 DIAGNOSIS — Z880 Allergy status to penicillin: Secondary | ICD-10-CM

## 2024-03-06 DIAGNOSIS — M069 Rheumatoid arthritis, unspecified: Secondary | ICD-10-CM | POA: Diagnosis present

## 2024-03-06 DIAGNOSIS — D649 Anemia, unspecified: Secondary | ICD-10-CM | POA: Diagnosis present

## 2024-03-06 DIAGNOSIS — I129 Hypertensive chronic kidney disease with stage 1 through stage 4 chronic kidney disease, or unspecified chronic kidney disease: Secondary | ICD-10-CM | POA: Diagnosis present

## 2024-03-06 DIAGNOSIS — Z888 Allergy status to other drugs, medicaments and biological substances status: Secondary | ICD-10-CM

## 2024-03-06 DIAGNOSIS — J159 Unspecified bacterial pneumonia: Secondary | ICD-10-CM | POA: Diagnosis present

## 2024-03-06 DIAGNOSIS — J440 Chronic obstructive pulmonary disease with acute lower respiratory infection: Secondary | ICD-10-CM | POA: Diagnosis present

## 2024-03-06 DIAGNOSIS — Z882 Allergy status to sulfonamides status: Secondary | ICD-10-CM

## 2024-03-06 LAB — CBC
HCT: 39.1 % (ref 36.0–46.0)
Hemoglobin: 12.8 g/dL (ref 12.0–15.0)
MCH: 29.6 pg (ref 26.0–34.0)
MCHC: 32.7 g/dL (ref 30.0–36.0)
MCV: 90.5 fL (ref 80.0–100.0)
Platelets: 374 10*3/uL (ref 150–400)
RBC: 4.32 MIL/uL (ref 3.87–5.11)
RDW: 13.2 % (ref 11.5–15.5)
WBC: 23.6 10*3/uL — ABNORMAL HIGH (ref 4.0–10.5)
nRBC: 0 % (ref 0.0–0.2)

## 2024-03-06 LAB — BASIC METABOLIC PANEL WITH GFR
Anion gap: 14 (ref 5–15)
BUN: 32 mg/dL — ABNORMAL HIGH (ref 8–23)
CO2: 26 mmol/L (ref 22–32)
Calcium: 9.2 mg/dL (ref 8.9–10.3)
Chloride: 99 mmol/L (ref 98–111)
Creatinine, Ser: 1.47 mg/dL — ABNORMAL HIGH (ref 0.44–1.00)
GFR, Estimated: 38 mL/min — ABNORMAL LOW (ref >60)
Glucose, Bld: 163 mg/dL — ABNORMAL HIGH (ref 70–99)
Potassium: 3.9 mmol/L (ref 3.5–5.1)
Sodium: 139 mmol/L (ref 135–145)

## 2024-03-06 LAB — TROPONIN I (HIGH SENSITIVITY)
Troponin I (High Sensitivity): 6 ng/L (ref <18)
Troponin I (High Sensitivity): 6 ng/L (ref <18)

## 2024-03-06 LAB — BLOOD GAS, VENOUS
Acid-Base Excess: 7.1 mmol/L — ABNORMAL HIGH (ref 0.0–2.0)
Bicarbonate: 33.9 mmol/L — ABNORMAL HIGH (ref 20.0–28.0)
Drawn by: 61519
O2 Saturation: 42.3 %
Patient temperature: 37.3
pCO2, Ven: 57 mmHg (ref 44–60)
pH, Ven: 7.39 (ref 7.25–7.43)
pO2, Ven: <31 mmHg — CL (ref 32–45)

## 2024-03-06 LAB — BRAIN NATRIURETIC PEPTIDE: B Natriuretic Peptide: 397 pg/mL — ABNORMAL HIGH (ref 0.0–100.0)

## 2024-03-06 MED ORDER — METHYLPREDNISOLONE SODIUM SUCC 40 MG IJ SOLR
40.0000 mg | Freq: Two times a day (BID) | INTRAMUSCULAR | Status: DC
  Administered 2024-03-06 – 2024-03-10 (×9): 40 mg via INTRAVENOUS
  Filled 2024-03-06 (×9): qty 1

## 2024-03-06 MED ORDER — ONDANSETRON HCL 4 MG PO TABS
4.0000 mg | ORAL_TABLET | Freq: Four times a day (QID) | ORAL | Status: DC | PRN
  Administered 2024-03-06: 4 mg via ORAL
  Filled 2024-03-06: qty 1

## 2024-03-06 MED ORDER — SODIUM CHLORIDE 0.9 % IV SOLN
1.0000 g | Freq: Once | INTRAVENOUS | Status: AC
  Administered 2024-03-06: 1 g via INTRAVENOUS
  Filled 2024-03-06: qty 10

## 2024-03-06 MED ORDER — METOPROLOL TARTRATE 50 MG PO TABS
50.0000 mg | ORAL_TABLET | Freq: Two times a day (BID) | ORAL | Status: DC
  Administered 2024-03-06 – 2024-03-10 (×9): 50 mg via ORAL
  Filled 2024-03-06 (×9): qty 1

## 2024-03-06 MED ORDER — ONDANSETRON HCL 4 MG/2ML IJ SOLN: 4.0000 mg | Freq: Four times a day (QID) | INTRAMUSCULAR | Status: DC | PRN

## 2024-03-06 MED ORDER — METHYLPREDNISOLONE SODIUM SUCC 125 MG IJ SOLR
125.0000 mg | Freq: Once | INTRAMUSCULAR | Status: AC
  Administered 2024-03-06: 125 mg via INTRAVENOUS
  Filled 2024-03-06: qty 2

## 2024-03-06 MED ORDER — GABAPENTIN 100 MG PO CAPS
100.0000 mg | ORAL_CAPSULE | Freq: Every day | ORAL | Status: DC
  Administered 2024-03-06 – 2024-03-09 (×4): 100 mg via ORAL
  Filled 2024-03-06 (×4): qty 1

## 2024-03-06 MED ORDER — ENOXAPARIN SODIUM 40 MG/0.4ML IJ SOSY
40.0000 mg | PREFILLED_SYRINGE | INTRAMUSCULAR | Status: DC
Start: 2024-03-06 — End: 2024-03-10
  Administered 2024-03-07 – 2024-03-10 (×4): 40 mg via SUBCUTANEOUS
  Filled 2024-03-06 (×5): qty 0.4

## 2024-03-06 MED ORDER — ACETAMINOPHEN 325 MG PO TABS: 650.0000 mg | ORAL_TABLET | Freq: Four times a day (QID) | ORAL | Status: DC | PRN

## 2024-03-06 MED ORDER — GUAIFENESIN ER 600 MG PO TB12
600.0000 mg | ORAL_TABLET | Freq: Two times a day (BID) | ORAL | Status: DC
  Administered 2024-03-06 – 2024-03-07 (×3): 600 mg via ORAL
  Filled 2024-03-06 (×3): qty 1

## 2024-03-06 MED ORDER — PRAVASTATIN SODIUM 10 MG PO TABS
20.0000 mg | ORAL_TABLET | Freq: Every day | ORAL | Status: DC
  Administered 2024-03-06 – 2024-03-09 (×4): 20 mg via ORAL
  Filled 2024-03-06 (×4): qty 2

## 2024-03-06 MED ORDER — ASPIRIN 81 MG PO TBEC
81.0000 mg | DELAYED_RELEASE_TABLET | Freq: Every day | ORAL | Status: DC
  Administered 2024-03-06 – 2024-03-10 (×5): 81 mg via ORAL
  Filled 2024-03-06 (×6): qty 1

## 2024-03-06 MED ORDER — ALBUTEROL SULFATE HFA 108 (90 BASE) MCG/ACT IN AERS: 2.0000 | INHALATION_SPRAY | RESPIRATORY_TRACT | Status: DC | PRN

## 2024-03-06 MED ORDER — ACETAMINOPHEN 650 MG RE SUPP: 650.0000 mg | Freq: Four times a day (QID) | RECTAL | Status: DC | PRN

## 2024-03-06 MED ORDER — FUROSEMIDE 10 MG/ML IJ SOLN
20.0000 mg | Freq: Once | INTRAMUSCULAR | Status: AC
  Administered 2024-03-06: 20 mg via INTRAVENOUS
  Filled 2024-03-06: qty 2

## 2024-03-06 MED ORDER — SODIUM CHLORIDE 0.9 % IV SOLN
2.0000 g | INTRAVENOUS | Status: DC
  Administered 2024-03-06 – 2024-03-10 (×5): 2 g via INTRAVENOUS
  Filled 2024-03-06 (×6): qty 20

## 2024-03-06 MED ORDER — HYDROXYCHLOROQUINE SULFATE 200 MG PO TABS
200.0000 mg | ORAL_TABLET | ORAL | Status: DC
  Administered 2024-03-06 – 2024-03-10 (×3): 200 mg via ORAL
  Filled 2024-03-06 (×5): qty 1

## 2024-03-06 MED ORDER — ARFORMOTEROL TARTRATE 15 MCG/2ML IN NEBU
15.0000 ug | INHALATION_SOLUTION | Freq: Two times a day (BID) | RESPIRATORY_TRACT | Status: DC
  Administered 2024-03-06 – 2024-03-10 (×9): 15 ug via RESPIRATORY_TRACT
  Filled 2024-03-06 (×9): qty 2

## 2024-03-06 MED ORDER — SODIUM CHLORIDE 0.9 % IV SOLN
500.0000 mg | Freq: Once | INTRAVENOUS | Status: AC
  Administered 2024-03-06: 500 mg via INTRAVENOUS
  Filled 2024-03-06: qty 5

## 2024-03-06 MED ORDER — AZITHROMYCIN 250 MG PO TABS
500.0000 mg | ORAL_TABLET | Freq: Every day | ORAL | Status: DC
  Administered 2024-03-06 – 2024-03-10 (×5): 500 mg via ORAL
  Filled 2024-03-06 (×5): qty 2

## 2024-03-06 MED ORDER — IPRATROPIUM-ALBUTEROL 0.5-2.5 (3) MG/3ML IN SOLN
3.0000 mL | Freq: Once | RESPIRATORY_TRACT | Status: AC
  Administered 2024-03-06: 3 mL via RESPIRATORY_TRACT
  Filled 2024-03-06: qty 3

## 2024-03-06 MED ORDER — BUDESONIDE 0.25 MG/2ML IN SUSP
0.2500 mg | Freq: Two times a day (BID) | RESPIRATORY_TRACT | Status: DC
Start: 2024-03-06 — End: 2024-03-10
  Administered 2024-03-06 – 2024-03-10 (×9): 0.25 mg via RESPIRATORY_TRACT
  Filled 2024-03-06 (×9): qty 2

## 2024-03-06 MED ORDER — AMLODIPINE BESYLATE 5 MG PO TABS
5.0000 mg | ORAL_TABLET | Freq: Every day | ORAL | Status: DC
  Administered 2024-03-06 – 2024-03-10 (×5): 5 mg via ORAL
  Filled 2024-03-06 (×5): qty 1

## 2024-03-06 MED ORDER — HYDROXYCHLOROQUINE SULFATE 200 MG PO TABS
400.0000 mg | ORAL_TABLET | ORAL | Status: DC
  Administered 2024-03-07 – 2024-03-09 (×2): 400 mg via ORAL
  Filled 2024-03-06 (×3): qty 2

## 2024-03-06 MED ORDER — IPRATROPIUM-ALBUTEROL 0.5-2.5 (3) MG/3ML IN SOLN
3.0000 mL | Freq: Four times a day (QID) | RESPIRATORY_TRACT | Status: DC
  Administered 2024-03-06 – 2024-03-07 (×6): 3 mL via RESPIRATORY_TRACT
  Filled 2024-03-06 (×6): qty 3

## 2024-03-06 MED ORDER — OXYCODONE HCL 5 MG PO TABS
5.0000 mg | ORAL_TABLET | ORAL | Status: DC | PRN
  Administered 2024-03-06 – 2024-03-09 (×5): 5 mg via ORAL
  Filled 2024-03-06 (×5): qty 1

## 2024-03-06 MED ORDER — PANTOPRAZOLE SODIUM 40 MG PO TBEC
40.0000 mg | DELAYED_RELEASE_TABLET | Freq: Every day | ORAL | Status: DC
  Administered 2024-03-06 – 2024-03-10 (×5): 40 mg via ORAL
  Filled 2024-03-06 (×5): qty 1

## 2024-03-06 NOTE — Progress Notes (Signed)
 Subjective: Patient admitted this morning, see detailed H&P by Dr Jeannene Milling  73 y.o. female with medical history significant of rheumatoid arthritis, asthma who presented with productive cough nasal congestion chest tightness.  Patient was seen by pulmonology on 4/10.  At times she been having productive cough and fever at home up to 102.  She was taking her inhalers but her symptoms fail to improve.  She presented to outpatient clinic for evaluation.  She was diagnosed with a asthma exacerbation given prednisone and inhalers.  She was given azithromycin due to concern for bacterial infection.  She presented to the emergency department due to Failure symptoms to improve.  She states she typically wears 2 L of oxygen.  She has had progressively worsening dyspnea congestion and cough.  Vitals:   03/06/24 0408 03/06/24 0800  BP:    Pulse:  99  Resp:  (!) 25  Temp: 99 F (37.2 C)   SpO2:  97%    Bilateral rhonchi auscultated  A/P   Bacterial community acquired pneumonia - Patient recently seen by pulmonology found to have concern for asthma exacerbation -Recently tried on azithromycin without improvement in symptoms - Chest x-ray on presentation showed infiltrate - Leukocytosis in setting of recent steroid prescription -ceftriaxone and azithromycin Schedule DuoNebs Pulmicort Brovana -Start Solu-Medrol 40 mg IV every 12 hours   # Rheumatoid arthritis-continue home Plaquenil   # Hypertension-continue metoprolol   # GERD-continue Protonix   # Hyperlipidemia-continue statin   # Chronic pain-continue gabapentin   # CAD-continue aspirin   # Hypertension-continue amlodipine   # CKD stage IIIb-trend creatinine   # Type 2 diabetes-patient is on oral antiglycemic's at home.  Will hold and continue to monitor   Ozell Blunt [facility]

## 2024-03-06 NOTE — Progress Notes (Signed)
   03/06/24 1400  Provider Notification  Provider Name/Title Daivd Dub, MD  Date Provider Notified 03/06/24  Method of Notification Page (secure chat)  Notification Reason Other (Comment) (resp 28)  Provider response See new orders  Date of Provider Response 03/06/24

## 2024-03-06 NOTE — Plan of Care (Signed)

## 2024-03-06 NOTE — ED Provider Notes (Signed)
 [place] EMERGENCY DEPARTMENT AT [facility] Provider Note   CSN: [id number] Arrival date & time: 03/06/24  0005     History  Chief Complaint  Patient presents with   Shortness of Breath    Misty Briggs is a 73 y.o. female.  Patient is a 73 year old female with past medical history of COPD, hypertension, hyperlipidemia.  Patient on home oxygen at night 2 L nasal cannula.  Patient presenting today with complaints of congestion cough, and shortness of breath that has been worsening over the past several weeks.  She was prescribed steroids and an antibiotic by her primary doctor, however she was unable to tolerate the antibiotic.  She describes some productive cough.  No fevers or chills.  No leg swelling or calf pain.       Home Medications Prior to Admission medications   Medication Sig Start Date End Date Taking? Authorizing Provider  albuterol (PROVENTIL) (2.5 MG/3ML) 0.083% nebulizer solution Twice daily for 3-5 days until better 03/01/24   Oretha Milch, MD  albuterol (VENTOLIN HFA) 108 (90 Base) MCG/ACT inhaler Inhale 1-2 puffs into the lungs every 6 (six) hours as needed for wheezing or shortness of breath. 02/02/24   Luciano Cutter, MD  amLODipine (NORVASC) 5 MG tablet Take 1 tablet (5 mg total) by mouth daily. 11/01/20 01/31/23  Darlin Drop, DO  Ascorbic Acid (VITAMIN C) 1000 MG tablet Take 1,000 mg by mouth daily.    [provider]  aspirin EC 81 MG tablet Take by mouth daily.     [provider]  azithromycin (ZITHROMAX) 250 MG tablet Take 1 tablet (250 mg total) by mouth daily for 5 days. 03/01/24 03/06/24  Oretha Milch, MD  Budeson-Glycopyrrol-Formoterol (BREZTRI AEROSPHERE) 160-9-4.8 MCG/ACT AERO Inhale 2 puffs into the lungs in the morning and at bedtime. 11/23/23   Luciano Cutter, MD  calcium carbonate (OS-CAL - DOSED IN MG OF ELEMENTAL CALCIUM) 1250 (500 Ca) MG tablet Take 2 tablets by mouth daily with breakfast.    [provider]  Coenzyme Q10 (CO Q-10 PO) Take by mouth daily.    [provider]  EPINEPHrine 0.3 mg/0.3 mL IJ SOAJ injection Inject 0.3 mLs into the muscle once as needed for anaphylaxis. 05/07/20   [provider]  ferrous sulfate 325 (65 FE) MG tablet Take 1 tablet (325 mg total) by mouth 2 (two) times daily with a meal. 11/19/18 10/29/20  Pyreddy, Vivien Rota, MD  fluticasone (FLONASE) 50 MCG/ACT nasal spray Place 1 spray into both nostrils daily as needed for allergies or rhinitis.    [provider]  furosemide (LASIX) 20 MG tablet Take 20 mg by mouth daily.  03/17/20   [provider]  gabapentin (NEURONTIN) 100 MG capsule Take 1 capsule by mouth at bedtime. 04/09/20   [provider]  hydroxychloroquine (PLAQUENIL) 200 MG tablet Alternate daily with 1 tablet then the next day take 2 tablets 01/03/20   [provider]  hydrOXYzine (ATARAX/VISTARIL) 25 MG tablet Take 25 mg by mouth every 4 (four) hours as needed for anxiety or itching. 08/20/20   [provider]  levocetirizine (XYZAL) 5 MG tablet Take 5 mg by mouth every evening.    [provider]  lovastatin (MEVACOR) 20 MG tablet Take 20 mg by mouth at bedtime.    [provider]  magnesium gluconate (MAGONATE) 500 MG tablet Take 500 mg by mouth daily.    [provider]  metFORMIN (GLUCOPHAGE) 1000  MG tablet Take 1 tablet (1,000 mg total) by mouth 2 (two) times daily with a meal. 11/19/18 10/29/20  Pyreddy, Pavan, MD  metoprolol tartrate (LOPRESSOR) 50 MG tablet Take 50 mg by mouth 2 (two) times daily. 09/03/20   [provider]  montelukast (SINGULAIR) 10 MG tablet 1 tablet in the evening    [provider]  Multiple Vitamin (MULTIVITAMIN) capsule Take 1 capsule by mouth daily.    [provider]  omeprazole (PRILOSEC) 20 MG capsule Take 20 mg by mouth daily.    [provider]  potassium chloride (KLOR-CON) 10 MEQ tablet  Take 10 mEq by mouth daily. 10/11/20   [provider]  predniSONE (DELTASONE) 10 MG tablet Take 4 tabs  daily with food x 4 days, then 3 tabs daily x 4 days, then 2 tabs daily x 4 days, then 1 tab daily x4 days then stop. #40 03/01/24   Lind Repine, MD  vitamin B-12 (CYANOCOBALAMIN) 1000 MCG tablet Take 1,000 mcg by mouth daily.    [provider]      Allergies    Doxycycline, Penicillins, Azithromycin, Levofloxacin, and Sulfa antibiotics    Review of Systems   Review of Systems  All other systems reviewed and are negative.   Physical Exam Updated Vital Signs BP 136/74 (BP Location: Right Arm)   Pulse 95   Temp 99.2 F (37.3 C) (Oral)   Resp 19   Ht 5\' 5"  (1.651 m)   Wt 72 kg   SpO2 (!) 87%   BMI 26.41 kg/m  Physical Exam Vitals and nursing note reviewed.  Constitutional:      General: She is not in acute distress.    Appearance: She is well-developed. She is not diaphoretic.  HENT:     Head: Normocephalic and atraumatic.  Cardiovascular:     Rate and Rhythm: Normal rate and regular rhythm.     Heart sounds: No murmur heard.    No friction rub. No gallop.  Pulmonary:     Effort: Pulmonary effort is normal. No respiratory distress.     Breath sounds: Examination of the right-middle field reveals rhonchi. Examination of the left-middle field reveals rhonchi. Rhonchi present. No wheezing.  Abdominal:     General: Bowel sounds are normal. There is no distension.     Palpations: Abdomen is soft.     Tenderness: There is no abdominal tenderness.  Musculoskeletal:        General: Normal range of motion.     Cervical back: Normal range of motion and neck supple.     Right lower leg: No tenderness. No edema.     Left lower leg: No tenderness. No edema.  Skin:    General: Skin is warm and dry.  Neurological:     General: No focal deficit present.     Mental Status: She is alert and oriented to person, place, and time.     ED Results / Procedures /  Treatments   Labs (all labs ordered are listed, but only abnormal results are displayed) Labs Reviewed  CBC - Abnormal; Notable for the following components:      Result Value   WBC 23.6 (*)    All other components within normal limits  BASIC METABOLIC PANEL WITH GFR  BLOOD GAS, VENOUS  TROPONIN I (HIGH SENSITIVITY)    EKG ED ECG REPORT   Date: 03/06/2024  Rate: 93  Rhythm: normal sinus rhythm  QRS Axis: normal  Intervals: normal  ST/T  Wave abnormalities: nonspecific T wave changes  Conduction Disutrbances:none  Narrative Interpretation:   Old EKG Reviewed: unchanged  I have personally reviewed the EKG tracing and agree with the computerized printout as noted.   Radiology No results found.  Procedures Procedures    Medications Ordered in ED Medications  albuterol (VENTOLIN HFA) 108 (90 Base) MCG/ACT inhaler 2 puff (has no administration in time range)  methylPREDNISolone sodium succinate (SOLU-MEDROL) 125 mg/2 mL injection 125 mg (has no administration in time range)  ipratropium-albuterol (DUONEB) 0.5-2.5 (3) MG/3ML nebulizer solution 3 mL (has no administration in time range)    ED Course/ Medical Decision Making/ A&P  Patient is a 73 year old female with history of COPD presenting with cough and shortness of breath as described in the HPI.  Patient arrives here with oxygen saturations in the low 80s and in mild to moderate respiratory distress.  There are expiratory rhonchi noted bilaterally.  Laboratory studies obtained including CBC, basic metabolic panel, and troponin.  White count is 23.6, BUN 32 and creatinine 1.47, but laboratory studies basically unremarkable otherwise.  Troponin is negative x 2.  A venous blood gas does reveal pO2 less than 31.  BNP mildly elevated at 397.  Chest x-ray obtained showing left lower lobe airspace disease with probable small left effusion concerning for pneumonia.  Patient has been given Rocephin and Zithromax and will be  admitted to the hospitalist service due to her hypoxia and continued respiratory difficulty.  She has also received 2 DuoNeb's and Solu-Medrol here in the ER.  Final Clinical Impression(s) / ED Diagnoses Final diagnoses:  None    Rx / DC Orders ED Discharge Orders     None         Orvilla Blander, MD 03/06/24 [phone]

## 2024-03-06 NOTE — Plan of Care (Signed)

## 2024-03-06 NOTE — H&P (Signed)
 History and Physical    Misty Briggs WJX:[id number] DOB: May 19, 1951 DOA: 03/06/2024  PCP: Jerl Mina, MD   Chief Complaint: Shortness of breath  HPI: Misty Briggs is a 73 y.o. female with medical history significant of rheumatoid arthritis, asthma who presented with productive cough nasal congestion chest tightness.  Patient was seen by pulmonology on 4/10.  At times she been having productive cough and fever at home up to 102.  She was taking her inhalers but her symptoms fail to improve.  She presented to outpatient clinic for evaluation.  She was diagnosed with a asthma exacerbation given prednisone and inhalers.  She was given azithromycin due to concern for bacterial infection.  She presented to the emergency department due to Failure symptoms to improve.  She states she typically wears 2 L of oxygen.  She has had progressively worsening dyspnea congestion and cough.  On arrival to the emergency department she was afebrile and hemodynamically stable.  Labs were obtained on presentation which showed creatinine 1.47 at baseline, WBC 23.6, hemoglobin 12.8.  Troponins X.  Blood gas showed pH 7.39, BNP 397.  Patient underwent chest x-ray which showed left lower lobe airspace disease concerning for infiltrate.  She was started on ceftriaxone azithromycin due to under lying allergies   Review of Systems: Review of Systems  Constitutional:  Positive for chills and fever.  HENT: Negative.    Eyes: Negative.   Respiratory:  Positive for cough, sputum production and shortness of breath.   Cardiovascular: Negative.   Gastrointestinal: Negative.   Genitourinary: Negative.   Musculoskeletal: Negative.   Skin: Negative.   Neurological: Negative.   Endo/Heme/Allergies: Negative.   Psychiatric/Behavioral: Negative.    All other systems reviewed and are negative.    As per HPI otherwise 10 point review of systems negative.   Allergies  Allergen Reactions   Doxycycline Other (See Comments)     Thrush   Penicillins Hives, Palpitations and Rash   Azithromycin Other (See Comments)    Thrush   Levofloxacin Other (See Comments)    Joint pain/muscle pain   Sulfa Antibiotics Rash    Past Medical History:  Diagnosis Date   Arthritis    Asthma    COPD (chronic obstructive pulmonary disease) (HCC)    Dr. Meredeth Ide, [facility] pulmonologist   Diabetes mellitus    Fever of unknown origin 02/28/2020   GERD (gastroesophageal reflux disease)    Heart murmur    History of COVID-19 11/03/2020   HLD (hyperlipidemia)    Hypertension    Myocardial infarction (HCC)    mild   Pneumonia due to COVID-19 virus 09/26/2019   PONV (postoperative nausea and vomiting)    itching sometimes   Rheumatoid arthritis(714.0)    Shortness of breath     Past Surgical History:  Procedure Laterality Date   ANTERIOR CERVICAL DECOMP/DISCECTOMY FUSION  05/05/2012   Procedure: ANTERIOR CERVICAL DECOMPRESSION/DISCECTOMY FUSION 1 LEVEL;  Surgeon: Karn Cassis, MD;  Location: [facility] NEURO ORS;  Service: Neurosurgery;  Laterality: N/A;  Cervical six seven Anterior cervical decompression/diskectomy, Fusion, Plate   COLONOSCOPY N/A 04/01/2023   Procedure: COLONOSCOPY;  Surgeon: [name], DO;  Location: [facility] ENDOSCOPY;  Service: Gastroenterology;  Laterality: N/A;   COLONOSCOPY WITH PROPOFOL N/A 09/08/2017   Procedure: COLONOSCOPY WITH PROPOFOL;  Surgeon: Christena Deem, MD;  Location: [facility] ENDOSCOPY;  Service: Endoscopy;  Laterality: N/A;   ENDOMETRIAL ABLATION     novasure   ESOPHAGOGASTRODUODENOSCOPY (EGD) WITH PROPOFOL N/A 04/01/2023  Procedure: ESOPHAGOGASTRODUODENOSCOPY (EGD) WITH PROPOFOL;  Surgeon: [name], DO;  Location: [facility] ENDOSCOPY;  Service: Gastroenterology;  Laterality: N/A;   FOOT SURGERY  2015   ROTATOR CUFF REPAIR     bilat   TRANSTHORACIC ECHOCARDIOGRAM  2010   [facility]   TUBAL LIGATION       reports that she has never smoked. She has  been exposed to tobacco smoke. She has never used smokeless tobacco. She reports that she does not currently use alcohol. She reports that she does not use drugs.  Family History  Problem Relation Age of Onset   Breast cancer Paternal Aunt     Prior to Admission medications   Medication Sig Start Date End Date Taking? Authorizing Provider  albuterol (PROVENTIL) (2.5 MG/3ML) 0.083% nebulizer solution Twice daily for 3-5 days until better 03/01/24   Oretha Milch, MD  albuterol (VENTOLIN HFA) 108 (90 Base) MCG/ACT inhaler Inhale 1-2 puffs into the lungs every 6 (six) hours as needed for wheezing or shortness of breath. 02/02/24   Luciano Cutter, MD  amLODipine (NORVASC) 5 MG tablet Take 1 tablet (5 mg total) by mouth daily. 11/01/20 01/31/23  Darlin Drop, DO  Ascorbic Acid (VITAMIN C) 1000 MG tablet Take 1,000 mg by mouth daily.    [provider]  aspirin EC 81 MG tablet Take by mouth daily.     [provider]  azithromycin (ZITHROMAX) 250 MG tablet Take 1 tablet (250 mg total) by mouth daily for 5 days. 03/01/24 03/06/24  Oretha Milch, MD  Budeson-Glycopyrrol-Formoterol (BREZTRI AEROSPHERE) 160-9-4.8 MCG/ACT AERO Inhale 2 puffs into the lungs in the morning and at bedtime. 11/23/23   Luciano Cutter, MD  calcium carbonate (OS-CAL - DOSED IN MG OF ELEMENTAL CALCIUM) 1250 (500 Ca) MG tablet Take 2 tablets by mouth daily with breakfast.    [provider]  Coenzyme Q10 (CO Q-10 PO) Take by mouth daily.    [provider]  EPINEPHrine 0.3 mg/0.3 mL IJ SOAJ injection Inject 0.3 mLs into the muscle once as needed for anaphylaxis. 05/07/20   [provider]  ferrous sulfate 325 (65 FE) MG tablet Take 1 tablet (325 mg total) by mouth 2 (two) times daily with a meal. 11/19/18 10/29/20  Pyreddy, Vivien Rota, MD  fluticasone (FLONASE) 50 MCG/ACT nasal spray Place 1 spray into both nostrils daily as needed for allergies or rhinitis.    [provider]   furosemide (LASIX) 20 MG tablet Take 20 mg by mouth daily.  03/17/20   [provider]  gabapentin (NEURONTIN) 100 MG capsule Take 1 capsule by mouth at bedtime. 04/09/20   [provider]  hydroxychloroquine (PLAQUENIL) 200 MG tablet Alternate daily with 1 tablet then the next day take 2 tablets 01/03/20   [provider]  hydrOXYzine (ATARAX/VISTARIL) 25 MG tablet Take 25 mg by mouth every 4 (four) hours as needed for anxiety or itching. 08/20/20   [provider]  levocetirizine (XYZAL) 5 MG tablet Take 5 mg by mouth every evening.    [provider]  lovastatin (MEVACOR) 20 MG tablet Take 20 mg by mouth at bedtime.    [provider]  magnesium gluconate (MAGONATE) 500 MG tablet Take 500 mg by mouth daily.    [provider]  metFORMIN (GLUCOPHAGE) 1000 MG tablet Take 1 tablet (1,000 mg total) by mouth 2 (two) times daily with a meal. 11/19/18 10/29/20  Ihor Austin, MD  metoprolol tartrate (LOPRESSOR) 50  MG tablet Take 50 mg by mouth 2 (two) times daily. 09/03/20   [provider]  montelukast (SINGULAIR) 10 MG tablet 1 tablet in the evening    [provider]  Multiple Vitamin (MULTIVITAMIN) capsule Take 1 capsule by mouth daily.    [provider]  omeprazole (PRILOSEC) 20 MG capsule Take 20 mg by mouth daily.    [provider]  potassium chloride (KLOR-CON) 10 MEQ tablet Take 10 mEq by mouth daily. 10/11/20   [provider]  predniSONE (DELTASONE) 10 MG tablet Take 4 tabs  daily with food x 4 days, then 3 tabs daily x 4 days, then 2 tabs daily x 4 days, then 1 tab daily x4 days then stop. #40 03/01/24   Lind Repine, MD  vitamin B-12 (CYANOCOBALAMIN) 1000 MCG tablet Take 1,000 mcg by mouth daily.    [provider]    Physical Exam: Vitals:   03/06/24 0017 03/06/24 0124 03/06/24 0130 03/06/24 0408  BP: 136/74  (!) 151/74   Pulse: 95  (!) 102   Resp: 19  (!) 22   Temp:  99.2 F (37.3 C)   99 F (37.2 C)  TempSrc: Oral   Oral  SpO2: (!) 87% 92% 95%   Weight:      Height:       Physical Exam Constitutional:      Appearance: She is normal weight.  HENT:     Head: Normocephalic.     Mouth/Throat:     Mouth: Mucous membranes are moist.  Eyes:     Pupils: Pupils are equal, round, and reactive to light.  Cardiovascular:     Rate and Rhythm: Normal rate and regular rhythm.  Pulmonary:     Effort: Pulmonary effort is normal.     Breath sounds: Normal breath sounds.  Abdominal:     Palpations: Abdomen is soft.  Musculoskeletal:        General: Normal range of motion.     Cervical back: Normal range of motion.  Skin:    General: Skin is warm.     Capillary Refill: Capillary refill takes less than 2 seconds.  Neurological:     General: No focal deficit present.     Mental Status: She is alert.  Psychiatric:        Mood and Affect: Mood normal.      Labs on Admission: I have personally reviewed the patients's labs and imaging studies.  Assessment/Plan Principal Problem:   Community acquired bacterial pneumonia   # Bacterial community acquired pneumonia - Patient recently seen by pulmonology found to have concern for some exacerbation -Recently tried on azithromycin without improvement in symptoms - Chest x-ray on presentation showed infiltrate - Leukocytosis in setting of recent steroid prescription Plan: Start ceftriaxone and azithromycin Schedule DuoNebs Pulmicort Brovana Hold on further steroids  # Rheumatoid arthritis-continue home Plaquenil  # Hypertension-continue metoprolol  # GERD-continue Protonix  # Hyperlipidemia-continue statin  # Chronic pain-continue gabapentin  # CAD-continue aspirin  # Hypertension-continue amlodipine  # CKD stage IIIb-trend creatinine  # Type 2 diabetes-patient is on oral antiglycemic's at home.  Will hold and continue to monitor    Admission status: Inpatient  Telemetry  Certification: The appropriate patient status for this patient is INPATIENT. Inpatient status is judged to be reasonable and necessary in order to provide the required intensity of service to ensure the patient's safety. The patient's presenting symptoms, physical exam findings, and initial radiographic and laboratory data in the  context of their chronic comorbidities is felt to place them at high risk for further clinical deterioration. Furthermore, it is not anticipated that the patient will be medically stable for discharge from the hospital within 2 midnights of admission.   * I certify that at the point of admission it is my clinical judgment that the patient will require inpatient hospital care spanning beyond 2 midnights from the point of admission due to high intensity of service, high risk for further deterioration and high frequency of surveillance required.Myrl Askew MD [facility] If 7PM-7AM, please contact night-coverage [email or web address]  03/06/2024, 5:03 AM

## 2024-03-06 NOTE — Progress Notes (Signed)
 Patient wears 2L at night but on RA throughout the day.  Has not had to use during the day, she states sats have been good through the day.

## 2024-03-06 NOTE — Progress Notes (Signed)
   03/06/24 1015  TOC Brief Assessment  Insurance and Status Reviewed  Patient has primary care physician Yes  Home environment has been reviewed from home  Prior level of function: independent  Prior/Current Home Services No current home services  Social Drivers of Health Review SDOH reviewed no interventions necessary  Readmission risk has been reviewed Yes  Transition of care needs no transition of care needs at this time    Transition of Care Department [facility]) has reviewed patient and no TOC needs have been identified at this time. We will continue to monitor patient advancement through interdisciplinary progression rounds. If new patient transition needs arise, please place a TOC consult.

## 2024-03-06 NOTE — ED Triage Notes (Signed)
 Pt c/o sob and pain all over since 01/21/24. Pt states she has seen pcp for the same.

## 2024-03-07 ENCOUNTER — Inpatient Hospital Stay (HOSPITAL_COMMUNITY)

## 2024-03-07 DIAGNOSIS — D72829 Elevated white blood cell count, unspecified: Secondary | ICD-10-CM

## 2024-03-07 DIAGNOSIS — J9621 Acute and chronic respiratory failure with hypoxia: Secondary | ICD-10-CM | POA: Diagnosis not present

## 2024-03-07 DIAGNOSIS — J159 Unspecified bacterial pneumonia: Secondary | ICD-10-CM | POA: Diagnosis not present

## 2024-03-07 LAB — COMPREHENSIVE METABOLIC PANEL WITH GFR
ALT: 21 U/L (ref 0–44)
AST: 15 U/L (ref 15–41)
Albumin: 2.7 g/dL — ABNORMAL LOW (ref 3.5–5.0)
Alkaline Phosphatase: 94 U/L (ref 38–126)
Anion gap: 8 (ref 5–15)
BUN: 28 mg/dL — ABNORMAL HIGH (ref 8–23)
CO2: 29 mmol/L (ref 22–32)
Calcium: 8.8 mg/dL — ABNORMAL LOW (ref 8.9–10.3)
Chloride: 99 mmol/L (ref 98–111)
Creatinine, Ser: 1.23 mg/dL — ABNORMAL HIGH (ref 0.44–1.00)
GFR, Estimated: 47 mL/min — ABNORMAL LOW (ref >60)
Glucose, Bld: 202 mg/dL — ABNORMAL HIGH (ref 70–99)
Potassium: 4.1 mmol/L (ref 3.5–5.1)
Sodium: 136 mmol/L (ref 135–145)
Total Bilirubin: 0.6 mg/dL (ref 0.0–1.2)
Total Protein: 6.3 g/dL — ABNORMAL LOW (ref 6.5–8.1)

## 2024-03-07 LAB — CBC
HCT: 36.2 % (ref 36.0–46.0)
Hemoglobin: 11.9 g/dL — ABNORMAL LOW (ref 12.0–15.0)
MCH: 29.7 pg (ref 26.0–34.0)
MCHC: 32.9 g/dL (ref 30.0–36.0)
MCV: 90.3 fL (ref 80.0–100.0)
Platelets: 364 10*3/uL (ref 150–400)
RBC: 4.01 MIL/uL (ref 3.87–5.11)
RDW: 13.2 % (ref 11.5–15.5)
WBC: 24.1 10*3/uL — ABNORMAL HIGH (ref 4.0–10.5)
nRBC: 0 % (ref 0.0–0.2)

## 2024-03-07 LAB — RESPIRATORY PANEL BY PCR

## 2024-03-07 LAB — RESP PANEL BY RT-PCR (RSV, FLU A&B, COVID)  RVPGX2
Influenza A by PCR: NEGATIVE
Influenza B by PCR: NEGATIVE
Resp Syncytial Virus by PCR: NEGATIVE
SARS Coronavirus 2 by RT PCR: NEGATIVE

## 2024-03-07 LAB — GLUCOSE, CAPILLARY
Glucose-Capillary: 248 mg/dL — ABNORMAL HIGH (ref 70–99)
Glucose-Capillary: 226 mg/dL — ABNORMAL HIGH (ref 70–99)
Glucose-Capillary: 219 mg/dL — ABNORMAL HIGH (ref 70–99)

## 2024-03-07 MED ORDER — INSULIN ASPART 100 UNIT/ML IJ SOLN
0.0000 [IU] | Freq: Three times a day (TID) | INTRAMUSCULAR | Status: DC
  Administered 2024-03-07 (×2): 3 [IU] via SUBCUTANEOUS
  Administered 2024-03-08: 2 [IU] via SUBCUTANEOUS

## 2024-03-07 MED ORDER — IPRATROPIUM BROMIDE 0.02 % IN SOLN
0.5000 mg | Freq: Four times a day (QID) | RESPIRATORY_TRACT | Status: DC
Start: 2024-03-07 — End: 2024-03-10
  Administered 2024-03-07 – 2024-03-10 (×12): 0.5 mg via RESPIRATORY_TRACT
  Filled 2024-03-07 (×12): qty 2.5

## 2024-03-07 MED ORDER — LEVALBUTEROL HCL 0.63 MG/3ML IN NEBU
0.6300 mg | INHALATION_SOLUTION | Freq: Four times a day (QID) | RESPIRATORY_TRACT | Status: DC
  Administered 2024-03-07 – 2024-03-10 (×12): 0.63 mg via RESPIRATORY_TRACT
  Filled 2024-03-07 (×12): qty 3

## 2024-03-07 MED ORDER — GUAIFENESIN ER 600 MG PO TB12
1200.0000 mg | ORAL_TABLET | Freq: Two times a day (BID) | ORAL | Status: DC
  Administered 2024-03-07 – 2024-03-10 (×6): 1200 mg via ORAL
  Filled 2024-03-07 (×6): qty 2

## 2024-03-07 MED ORDER — INSULIN ASPART 100 UNIT/ML IJ SOLN
0.0000 [IU] | Freq: Every day | INTRAMUSCULAR | Status: DC
  Administered 2024-03-07: 2 [IU] via SUBCUTANEOUS

## 2024-03-07 MED ORDER — GUAIFENESIN-DM 100-10 MG/5ML PO SYRP
5.0000 mL | ORAL_SOLUTION | ORAL | Status: DC | PRN
  Administered 2024-03-07: 5 mL via ORAL
  Filled 2024-03-07: qty 5

## 2024-03-07 NOTE — Progress Notes (Signed)
 Patient placed on Droplet precaution per Dr Aura Leeds Latif,orders.and per Infection control policy.Patient and family educated on Droplet precaution,verbalized understanding, educational care notes provided,family at bedside.Plan of care on going.

## 2024-03-07 NOTE — Hospital Course (Addendum)
 The patient is a 73 year old Caucasian female with a past medical history significant for RA, asthma and other comorbidities who presented with a productive cough, nasal congestion and chest tightness.  She was seen by pulmonary on 03/01/2024 and has been having productive cough and fever up to 102 at home.  She is diagnosed with asthma exacerbation and fever of unknown origin and was given steroids and inhalers as well as azithromycin.  Given her worsening symptoms with failure to improve she presented to the ED for further evaluation.  Of note she had to be on supplemental oxygen consistently rather than just nightly.  Further workup shows a left lower lobe pneumonia.  She has been placed on antibiotics, nebs, steroids and treatment as below and will continue to monitor closely.  Assessment and Plan:  Acute on Chronic Respiratory Failure w/ Hypoxia in the setting of CAP with Underlying COPD and Asthma Exacerbation: See below. Will c/w Treatment with changes.  Continuous pulse oximetry and maintain O2 saturations greater than 90%.  Continue supplemental oxygen via nasal cannula wean O2 as tolerated.  Will need an amatory home O2 screen prior to discharge and repeat chest x-ray today is pending.  Bacterial Community Acquired Pneumonia: Patient recently seen by Pulmonology found to have concern for asthma exacerbation. Recently tried on azithromycin without improvement in symptoms. Chest x-ray on presentation showed infiltrate in the LLL w/ small Pleural effusion. Leukocytosis in setting of recent steroid prescription. C/w IV Ceftriaxone and Azithromycin. Schedule DuoNebs changed to Xopenex/Atrovent q6h. C/w Arfomoterol 15 mcg BID and Budesonide 0.25 mg BID. C/w Solu-Medrol 40 mg IV every 12 hours. C/w Guaifenesin 1200 mg po BID, Incentive Spirometry, and Flutter Valve check respiratory virus panel and COVID, influenza A/B and RSV PCR   Rheumatoid Arthritis: C/w home Hydroxychloroquine 400 mg po and 200 mg po  EOD. Previously was on Methotrexate and now being avoided due to its immunosuppressive effects that could exacerbate respiratory infections   Hyperlipidemia: C/w Pravastatin 20 mg po Daily    Chronic Pain: C/w Gabapentin 100 gm po Daily    CAD: C/w Aspirin 81 mg po Daily, Metoprolol Tartrate 50 mg po BID, Pravastatin 20 mg po Daily    Essential Hypertension: C/w Amlodipine 5 mg po Daily and Metoprolol Tartrate 50 mg po BID. CTM BP per Protocol. Last BP reading was 138/72  Leukocytosis: In the setting of Steroid Demargination. WBC went from 23.6 -> 24.1. CTM and Trend and repeat CBC in the AM    CKD stage IIIb: BUN/Cr Trend, improving: Recent Labs  Lab 03/06/24 0051 03/07/24 0413  BUN 32* 28*  CREATININE 1.47* 1.23*  Avoid Nephrotoxic Medications, Contrast Dyes, Hypotension and Dehydration to Ensure Adequate Renal Perfusion and will need to Renally Adjust Meds. CTM and Trend Renal Function carefully and repeat CMP in the AM   Normocytic Anemia: Hgb/Hct went from 12.8/39.1 -> 11.9/36.2. Check Anemia Panel in the AM. CTM for S/Sx of Bleeding; No overt bleeding noted. Repeat CBC in the AM  Diabetes Mellitus Type 2 with Neuropathy: Patient is on oral antiglycemic's at home w/ Metformin 1000 mg po BID.  Will hold and place on Sensitive Novolog SSI AC/HS given blood sugars to be elevated in the setting of Steroid Demargination. Check HbA1c in the AM. C/w Gabapentin 100 mg po qHS  GERD/GI Prophylaxis: C/w PPI with Pantoprazole 40 mg po Daily  Hypoalbuminemia: Patient's Albumin is now 2.7. CTM and Trend and repeat CMP in the AM  Overweight: Complicates overall prognosis and care. Estimated  body mass index is 25.62 kg/m as calculated from the following:   Height as of this encounter: 5\' 6"  (1.676 m).   Weight as of this encounter: 72 kg. Weight Loss and Dietary Counseling given

## 2024-03-07 NOTE — Progress Notes (Signed)
 Nurse at bedside,patient alert and oriented times four. No c/o pain or discomfort noted . Patient assisted to bathroom,ambulated in room without difficulty.Oxygen at 3 liters via nasal canula. Plan of care on going.

## 2024-03-07 NOTE — Progress Notes (Signed)
 After patient's treatment this evening, patient started coughing and was able to get up a moderate amount of thick, yellow secretions.  Patient showed me the secretions she was able to cough up, but was still complaining with her side hurting when she coughed.

## 2024-03-07 NOTE — Progress Notes (Signed)
 PROGRESS NOTE    Misty Briggs  WUJ:[id number] DOB: 1951/06/01 DOA: 03/06/2024 PCP: Jerl Mina, MD   Brief Narrative:  The patient is a 73 year old Caucasian female with a past medical history significant for RA, asthma and other comorbidities who presented with a productive cough, nasal congestion and chest tightness.  She was seen by pulmonary on 03/01/2024 and has been having productive cough and fever up to 102 at home.  She is diagnosed with asthma exacerbation and fever of unknown origin and was given steroids and inhalers as well as azithromycin.  Given her worsening symptoms with failure to improve she presented to the ED for further evaluation.  Of note she had to be on supplemental oxygen consistently rather than just nightly.  Further workup shows a left lower lobe pneumonia.  She has been placed on antibiotics, nebs, steroids and treatment as below and will continue to monitor closely.  Assessment and Plan:  Acute on Chronic Respiratory Failure w/ Hypoxia in the setting of CAP with Underlying COPD and Asthma Exacerbation: See below. Will c/w Treatment with changes.  Continuous pulse oximetry and maintain O2 saturations greater than 90%.  Continue supplemental oxygen via nasal cannula wean O2 as tolerated.  Will need an amatory home O2 screen prior to discharge and repeat chest x-ray today is pending.  Bacterial Community Acquired Pneumonia: Patient recently seen by Pulmonology found to have concern for asthma exacerbation. Recently tried on azithromycin without improvement in symptoms. Chest x-ray on presentation showed infiltrate in the LLL w/ small Pleural effusion. Leukocytosis in setting of recent steroid prescription. C/w IV Ceftriaxone and Azithromycin. Schedule DuoNebs changed to Xopenex/Atrovent q6h. C/w Arfomoterol 15 mcg BID and Budesonide 0.25 mg BID. C/w Solu-Medrol 40 mg IV every 12 hours. C/w Guaifenesin 1200 mg po BID, Incentive Spirometry, and Flutter Valve check  respiratory virus panel and COVID, influenza A/B and RSV PCR   Rheumatoid Arthritis: C/w home Hydroxychloroquine 400 mg po and 200 mg po EOD. Previously was on Methotrexate and now being avoided due to its immunosuppressive effects that could exacerbate respiratory infections   Hyperlipidemia: C/w Pravastatin 20 mg po Daily    Chronic Pain: C/w Gabapentin 100 gm po Daily    CAD: C/w Aspirin 81 mg po Daily, Metoprolol Tartrate 50 mg po BID, Pravastatin 20 mg po Daily    Essential Hypertension: C/w Amlodipine 5 mg po Daily and Metoprolol Tartrate 50 mg po BID. CTM BP per Protocol. Last BP reading was 138/72  Leukocytosis: In the setting of Steroid Demargination. WBC went from 23.6 -> 24.1. CTM and Trend and repeat CBC in the AM    CKD stage IIIb: BUN/Cr Trend, improving: Recent Labs  Lab 03/06/24 0051 03/07/24 0413  BUN 32* 28*  CREATININE 1.47* 1.23*  Avoid Nephrotoxic Medications, Contrast Dyes, Hypotension and Dehydration to Ensure Adequate Renal Perfusion and will need to Renally Adjust Meds. CTM and Trend Renal Function carefully and repeat CMP in the AM   Normocytic Anemia: Hgb/Hct went from 12.8/39.1 -> 11.9/36.2. Check Anemia Panel in the AM. CTM for S/Sx of Bleeding; No overt bleeding noted. Repeat CBC in the AM  Diabetes Mellitus Type 2 with Neuropathy: Patient is on oral antiglycemic's at home w/ Metformin 1000 mg po BID.  Will hold and place on Sensitive Novolog SSI AC/HS given blood sugars to be elevated in the setting of Steroid Demargination. Check HbA1c in the AM. C/w Gabapentin 100 mg po qHS  GERD/GI Prophylaxis: C/w PPI with Pantoprazole 40 mg po Daily  Hypoalbuminemia: Patient's Albumin is now 2.7. CTM and Trend and repeat CMP in the AM  Overweight: Complicates overall prognosis and care. Estimated body mass index is 25.62 kg/m as calculated from the following:   Height as of this encounter: 5\' 6"  (1.676 m).   Weight as of this encounter: 72 kg. Weight Loss and  Dietary Counseling given   DVT prophylaxis: enoxaparin (LOVENOX) injection 40 mg Start: 03/06/24 1000 SCDs Start: 03/06/24 0500    Code Status: Full Code Family Communication: D/w family present at bedside   Disposition Plan:  Level of care: Telemetry Status is: Inpatient Remains inpatient appropriate because: Needs further clinical improvement in respiratory status and further workup   Consultants:  None  Procedures:  As delineated as above  Antimicrobials:  Anti-infectives (From admission, onward)    Start     Dose/Rate Route Frequency Ordered Stop   03/07/24 1000  hydroxychloroquine (PLAQUENIL) tablet 400 mg       Note to Pharmacy: Alternate daily with 1 tablet then the next day take 2 tablets   400 mg Oral Every other day 03/06/24 0503     03/06/24 1115  hydroxychloroquine (PLAQUENIL) tablet 200 mg        200 mg Oral Every other day 03/06/24 1018     03/06/24 1000  azithromycin (ZITHROMAX) tablet 500 mg        500 mg Oral Daily 03/06/24 0507     03/06/24 0900  cefTRIAXone (ROCEPHIN) 2 g in sodium chloride 0.9 % 100 mL IVPB        2 g 200 mL/hr over 30 Minutes Intravenous Every 24 hours 03/06/24 0507     03/06/24 0245  cefTRIAXone (ROCEPHIN) 1 g in sodium chloride 0.9 % 100 mL IVPB        1 g 200 mL/hr over 30 Minutes Intravenous  Once 03/06/24 0236 03/06/24 0355   03/06/24 0245  azithromycin (ZITHROMAX) 500 mg in sodium chloride 0.9 % 250 mL IVPB        500 mg 250 mL/hr over 60 Minutes Intravenous  Once 03/06/24 0236 03/06/24 0272       Subjective: Seen and examined at bedside and she is still feeling short of breath and was up to 6 L of supplemental oxygen via nasal cannula.  States that her shoulder and her back hurts whenever she coughs.  No nausea or vomiting.  States that she has been feeling ill for a while and continues to have complaint of discomfort when she coughs.  States her fevers have resolved though.  No other concerns or complaints this  time.  Objective: Vitals:   03/07/24 0853 03/07/24 0939 03/07/24 1349 03/07/24 1411  BP:  130/63  (!) 114/52  Pulse:  98  85  Resp:    18  Temp:  98 F (36.7 C)    TempSrc:  Oral  Oral  SpO2: 92% 92% 95% 93%  Weight:      Height:        Intake/Output Summary (Last 24 hours) at 03/07/2024 1632 Last data filed at 03/07/2024 1610 Gross per 24 hour  Intake 480 ml  Output --  Net 480 ml   Filed Weights   03/06/24 0015  Weight: 72 kg   Examination: Physical Exam:  Constitutional: WN/WD overweight elderly Caucasian female no acute distress Respiratory: Diminished to auscultation bilaterally with some coarse breath sounds and does have some rhonchi noted.  No appreciable rales or crackles.  Has a normal respiratory effort and is wearing  supplemental oxygen via nasal cannula. Cardiovascular: RRR, no murmurs / rubs / gallops. S1 and S2 auscultated. No extremity edema Abdomen: Soft, non-tender, slightly distended secondary to body habitus. Bowel sounds positive.  GU: Deferred. Musculoskeletal: No clubbing / cyanosis of digits/nails. No joint deformity upper and lower extremities.  Painful palpation of the lower extremities Skin: No rashes, lesions, ulcers on limited skin evaluation. No induration; Warm and dry.  Neurologic: CN 2-12 grossly intact with no focal deficits. Romberg sign and cerebellar reflexes not assessed.  Psychiatric: Normal judgment and insight. Alert and oriented x 3. Normal mood and appropriate affect.   Data Reviewed: I have personally reviewed following labs and imaging studies  CBC: Recent Labs  Lab 03/06/24 0051 03/07/24 0413  WBC 23.6* 24.1*  HGB 12.8 11.9*  HCT 39.1 36.2  MCV 90.5 90.3  PLT 374 364   Basic Metabolic Panel: Recent Labs  Lab 03/06/24 0051 03/07/24 0413  NA 139 136  K 3.9 4.1  CL 99 99  CO2 26 29  GLUCOSE 163* 202*  BUN 32* 28*  CREATININE 1.47* 1.23*  CALCIUM 9.2 8.8*   GFR: Estimated Creatinine Clearance: 42.6 mL/min (A)  (by C-G formula based on SCr of 1.23 mg/dL (H)). Liver Function Tests: Recent Labs  Lab 03/07/24 0413  AST 15  ALT 21  ALKPHOS 94  BILITOT 0.6  PROT 6.3*  ALBUMIN 2.7*   No results for input(s): "LIPASE", "AMYLASE" in the last 168 hours. No results for input(s): "AMMONIA" in the last 168 hours. Coagulation Profile: No results for input(s): "INR", "PROTIME" in the last 168 hours. Cardiac Enzymes: No results for input(s): "CKTOTAL", "CKMB", "CKMBINDEX", "TROPONINI" in the last 168 hours. BNP (last 3 results) No results for input(s): "PROBNP" in the last 8760 hours. HbA1C: No results for input(s): "HGBA1C" in the last 72 hours. CBG: Recent Labs  Lab 03/07/24 1121 03/07/24 1626  GLUCAP 248* 226*   Lipid Profile: No results for input(s): "CHOL", "HDL", "LDLCALC", "TRIG", "CHOLHDL", "LDLDIRECT" in the last 72 hours. Thyroid Function Tests: No results for input(s): "TSH", "T4TOTAL", "FREET4", "T3FREE", "THYROIDAB" in the last 72 hours. Anemia Panel: No results for input(s): "VITAMINB12", "FOLATE", "FERRITIN", "TIBC", "IRON", "RETICCTPCT" in the last 72 hours. Sepsis Labs: No results for input(s): "PROCALCITON", "LATICACIDVEN" in the last 168 hours.  No results found for this or any previous visit (from the past 240 hours).   Radiology Studies: DG Chest 2 View Result Date: 03/06/2024 CLINICAL DATA:  Shortness of breath, cough, wheezing EXAM: CHEST - 2 VIEW COMPARISON:  03/01/2024 FINDINGS: Patchy airspace disease in the left lower lobe concerning for pneumonia. No confluent opacity on the right. Small left pleural effusions suspected. Heart and mediastinal contours within limits. IMPRESSION: Left lower lobe airspace disease with probable small left effusion. Findings concerning for pneumonia. Electronically Signed   By: Janeece Mechanic M.D.   On: 03/06/2024 01:55   Scheduled Meds:  amLODipine  5 mg Oral Daily   arformoterol  15 mcg Nebulization BID   aspirin EC  81 mg Oral Daily    azithromycin  500 mg Oral Daily   budesonide (PULMICORT) nebulizer solution  0.25 mg Nebulization BID   enoxaparin (LOVENOX) injection  40 mg Subcutaneous Q24H   gabapentin  100 mg Oral QHS   guaiFENesin  1,200 mg Oral BID   hydroxychloroquine  200 mg Oral QODAY   hydroxychloroquine  400 mg Oral QODAY   insulin aspart  0-5 Units Subcutaneous QHS   insulin aspart  0-9 Units Subcutaneous  TID WC   ipratropium  0.5 mg Nebulization Q6H   levalbuterol  0.63 mg Nebulization Q6H   methylPREDNISolone (SOLU-MEDROL) injection  40 mg Intravenous Q12H   metoprolol tartrate  50 mg Oral BID   pantoprazole  40 mg Oral Daily   pravastatin  20 mg Oral q1800   Continuous Infusions:  cefTRIAXone (ROCEPHIN)  IV 2 g (03/07/24 1132)    LOS: 1 day   Aura Leeds, DO [facility] Available via Epic secure chat 7am-7pm After these hours, please refer to coverage provider listed on [email or web address] 03/07/2024, 4:32 PM

## 2024-03-07 NOTE — Plan of Care (Signed)

## 2024-03-07 NOTE — Plan of Care (Signed)
   Problem: Education: Goal: Knowledge of General Education information will improve Description Including pain rating scale, medication(s)/side effects and non-pharmacologic comfort measures Outcome: Progressing   Problem: Education: Goal: Knowledge of General Education information will improve Description Including pain rating scale, medication(s)/side effects and non-pharmacologic comfort measures Outcome: Progressing

## 2024-03-08 ENCOUNTER — Inpatient Hospital Stay (HOSPITAL_COMMUNITY)

## 2024-03-08 DIAGNOSIS — J159 Unspecified bacterial pneumonia: Secondary | ICD-10-CM | POA: Diagnosis not present

## 2024-03-08 LAB — CBC WITH DIFFERENTIAL/PLATELET
Abs Immature Granulocytes: 0.4 10*3/uL — ABNORMAL HIGH (ref 0.00–0.07)
Band Neutrophils: 7 %
Basophils Absolute: 0 10*3/uL (ref 0.0–0.1)
Basophils Relative: 0 %
Eosinophils Absolute: 0 10*3/uL (ref 0.0–0.5)
Eosinophils Relative: 0 %
HCT: 34.8 % — ABNORMAL LOW (ref 36.0–46.0)
Hemoglobin: 11.5 g/dL — ABNORMAL LOW (ref 12.0–15.0)
Lymphocytes Relative: 4 %
Lymphs Abs: 0.8 10*3/uL (ref 0.7–4.0)
MCH: 29.4 pg (ref 26.0–34.0)
MCHC: 33 g/dL (ref 30.0–36.0)
MCV: 89 fL (ref 80.0–100.0)
Metamyelocytes Relative: 1 %
Monocytes Absolute: 0.2 10*3/uL (ref 0.1–1.0)
Monocytes Relative: 1 %
Myelocytes: 1 %
Neutro Abs: 18 10*3/uL — ABNORMAL HIGH (ref 1.7–7.7)
Neutrophils Relative %: 86 %
Platelets: 381 10*3/uL (ref 150–400)
RBC: 3.91 MIL/uL (ref 3.87–5.11)
RDW: 13.3 % (ref 11.5–15.5)
Smear Review: ADEQUATE
WBC: 19.4 10*3/uL — ABNORMAL HIGH (ref 4.0–10.5)
nRBC: 0 % (ref 0.0–0.2)

## 2024-03-08 LAB — COMPREHENSIVE METABOLIC PANEL WITH GFR
ALT: 26 U/L (ref 0–44)
AST: 18 U/L (ref 15–41)
Albumin: 2.5 g/dL — ABNORMAL LOW (ref 3.5–5.0)
Alkaline Phosphatase: 96 U/L (ref 38–126)
Anion gap: 14 (ref 5–15)
BUN: 33 mg/dL — ABNORMAL HIGH (ref 8–23)
CO2: 28 mmol/L (ref 22–32)
Calcium: 8.8 mg/dL — ABNORMAL LOW (ref 8.9–10.3)
Chloride: 95 mmol/L — ABNORMAL LOW (ref 98–111)
Creatinine, Ser: 1.13 mg/dL — ABNORMAL HIGH (ref 0.44–1.00)
GFR, Estimated: 52 mL/min — ABNORMAL LOW (ref >60)
Glucose, Bld: 209 mg/dL — ABNORMAL HIGH (ref 70–99)
Potassium: 3.9 mmol/L (ref 3.5–5.1)
Sodium: 137 mmol/L (ref 135–145)
Total Bilirubin: 0.4 mg/dL (ref 0.0–1.2)
Total Protein: 6.3 g/dL — ABNORMAL LOW (ref 6.5–8.1)

## 2024-03-08 LAB — HEMOGLOBIN A1C
Hgb A1c MFr Bld: 6.4 % — ABNORMAL HIGH (ref 4.8–5.6)
Mean Plasma Glucose: 136.98 mg/dL

## 2024-03-08 LAB — GLUCOSE, CAPILLARY
Glucose-Capillary: 199 mg/dL — ABNORMAL HIGH (ref 70–99)
Glucose-Capillary: 329 mg/dL — ABNORMAL HIGH (ref 70–99)
Glucose-Capillary: 131 mg/dL — ABNORMAL HIGH (ref 70–99)
Glucose-Capillary: 226 mg/dL — ABNORMAL HIGH (ref 70–99)

## 2024-03-08 LAB — PROCALCITONIN: Procalcitonin: 1.18 ng/mL

## 2024-03-08 LAB — MAGNESIUM: Magnesium: 2.2 mg/dL (ref 1.7–2.4)

## 2024-03-08 LAB — PHOSPHORUS: Phosphorus: 3.5 mg/dL (ref 2.5–4.6)

## 2024-03-08 MED ORDER — NYSTATIN 100000 UNIT/ML MT SUSP
5.0000 mL | Freq: Four times a day (QID) | OROMUCOSAL | Status: DC
  Administered 2024-03-08 – 2024-03-10 (×8): 500000 [IU] via ORAL
  Filled 2024-03-08 (×8): qty 5

## 2024-03-08 MED ORDER — INSULIN ASPART 100 UNIT/ML IJ SOLN
0.0000 [IU] | Freq: Every day | INTRAMUSCULAR | Status: DC
  Administered 2024-03-08: 2 [IU] via SUBCUTANEOUS
  Administered 2024-03-09: 3 [IU] via SUBCUTANEOUS

## 2024-03-08 MED ORDER — INSULIN ASPART 100 UNIT/ML IJ SOLN
0.0000 [IU] | Freq: Three times a day (TID) | INTRAMUSCULAR | Status: DC
  Administered 2024-03-08: 11 [IU] via SUBCUTANEOUS
  Administered 2024-03-08: 2 [IU] via SUBCUTANEOUS
  Administered 2024-03-09: 3 [IU] via SUBCUTANEOUS
  Administered 2024-03-09: 15 [IU] via SUBCUTANEOUS
  Administered 2024-03-09 – 2024-03-10 (×2): 3 [IU] via SUBCUTANEOUS
  Administered 2024-03-10: 5 [IU] via SUBCUTANEOUS

## 2024-03-08 NOTE — Plan of Care (Signed)
  Problem: Acute Rehab PT Goals(only PT should resolve) Goal: Pt Will Go Supine/Side To Sit Outcome: Progressing Flowsheets (Taken 03/08/2024 1355) Pt will go Supine/Side to Sit:  Independently  with modified independence Goal: Patient Will Transfer Sit To/From Stand Outcome: Progressing Flowsheets (Taken 03/08/2024 1355) Patient will transfer sit to/from stand:  with modified independence  with supervision Goal: Pt Will Transfer Bed To Chair/Chair To Bed Outcome: Progressing Flowsheets (Taken 03/08/2024 1355) Pt will Transfer Bed to Chair/Chair to Bed:  with modified independence  with supervision Goal: Pt Will Ambulate Outcome: Progressing Flowsheets (Taken 03/08/2024 1355) Pt will Ambulate:  75 feet  with modified independence  with supervision  with rolling walker   1:56 PM, 03/08/24 Walton Guppy, MPT Physical Therapist with [facility] [phone] office [phone] mobile phone

## 2024-03-08 NOTE — Evaluation (Signed)
 Physical Therapy Evaluation Patient Details Name: Misty Briggs MRN: [id number] DOB: [birth year] Today's Date: 03/08/2024  History of Present Illness  Misty Briggs is a 73 y.o. female with medical history significant of rheumatoid arthritis, asthma who presented with productive cough nasal congestion chest tightness.  Patient was seen by pulmonology on 4/10.  At times she been having productive cough and fever at home up to 102.  She was taking her inhalers but her symptoms fail to improve.  She presented to outpatient clinic for evaluation.  She was diagnosed with a asthma exacerbation given prednisone and inhalers.  She was given azithromycin due to concern for bacterial infection.  She presented to the emergency department due to Failure symptoms to improve.  She states she typically wears 2 L of oxygen.  She has had progressively worsening dyspnea congestion and cough.  On arrival to the emergency department she was afebrile and hemodynamically stable.  Labs were obtained on presentation which showed creatinine 1.47 at baseline, WBC 23.6, hemoglobin 12.8.  Troponins X.  Blood gas showed pH 7.39, BNP 397.  Patient underwent chest x-ray which showed left lower lobe airspace disease concerning for infiltrate.  She was started on ceftriaxone azithromycin due to under lying allergies   Clinical Impression  Patient requires HOB partially raised mostly due to discomfort from old neck surgery, able to ambulate in room with frequent leaning on walls without AD, on 3 LPM O2 with SpO2 at 88-90%, limited mostly due to SOB and fatigue and tolerated sitting up at bedside after therapy with family member present.  Patient SpO2 at 94% while resting at bedside - nurse notified. Patient will benefit from continued skilled physical therapy in hospital and recommended venue below to increase strength, balance, endurance for safe ADLs and gait.       If plan is discharge home, recommend the following: A little help with  walking and/or transfers;A little help with bathing/dressing/bathroom;Help with stairs or ramp for entrance;Assistance with cooking/housework   Can travel by private vehicle        Equipment Recommendations None recommended by PT  Recommendations for Other Services       Functional Status Assessment Patient has had a recent decline in their functional status and demonstrates the ability to make significant improvements in function in a reasonable and predictable amount of time.     Precautions / Restrictions Precautions Precautions: Fall Restrictions Weight Bearing Restrictions Per Provider Order: No      Mobility  Bed Mobility Overal bed mobility: Modified Independent             General bed mobility comments: requires HOB partially raised due to old neck surgery    Transfers Overall transfer level: Needs assistance Equipment used: Rolling walker (2 wheels) Transfers: Sit to/from Stand, Bed to chair/wheelchair/BSC Sit to Stand: Supervision, Contact guard assist   Step pivot transfers: Contact guard assist, Supervision       General transfer comment: has to lean on nearby objects for support    Ambulation/Gait Ambulation/Gait assistance: Supervision, Contact guard assist Gait Distance (Feet): 40 Feet Assistive device: 1 person hand held assist, None Gait Pattern/deviations: Decreased step length - left, Decreased stance time - right, Decreased stride length Gait velocity: decreased     General Gait Details: slow labored unsteady cadence with frequent leaning on walls, nearby objects for support, no loss of balance, on 3 lpm O2 with SpO2 at 88-90%, limited mostly due to fatigue  Stairs  Wheelchair Mobility     Tilt Bed    Modified Rankin (Stroke Patients Only)       Balance Overall balance assessment: Needs assistance Sitting-balance support: Feet supported, No upper extremity supported Sitting balance-Leahy Scale: Fair Sitting  balance - Comments: fair/good seated at EOB   Standing balance support: During functional activity, No upper extremity supported Standing balance-Leahy Scale: Poor Standing balance comment: fair/poor without AD                             Pertinent Vitals/Pain Pain Assessment Pain Assessment: No/denies pain    Home Living Family/patient expects to be discharged to:: Private residence Living Arrangements: Spouse/significant other;Children Available Help at Discharge: Family;Available 24 hours/day Type of Home: House Home Access: Level entry       Home Layout: Able to live on main level with bedroom/bathroom;Full bath on main level (3 level house per patient, stays on 1st floor, does not go up stairs) Home Equipment: Pharmacist, hospital (2 wheels);Cane - single point Additional Comments: on 2 LPM O2 at night    Prior Function Prior Level of Function : Independent/Modified Independent;Driving             Mobility Comments: Community ambulation without AD, drives, shops ADLs Comments: Independent     Extremity/Trunk Assessment   Upper Extremity Assessment Upper Extremity Assessment: Defer to OT evaluation    Lower Extremity Assessment Lower Extremity Assessment: Generalized weakness    Cervical / Trunk Assessment Cervical / Trunk Assessment: Normal  Communication   Communication Communication: No apparent difficulties    Cognition Arousal: Alert Behavior During Therapy: WFL for tasks assessed/performed   PT - Cognitive impairments: No apparent impairments                         Following commands: Intact       Cueing Cueing Techniques: Verbal cues     General Comments      Exercises     Assessment/Plan    PT Assessment Patient needs continued PT services  PT Problem List Decreased strength;Decreased activity tolerance;Decreased balance;Decreased mobility       PT Treatment Interventions DME instruction;Gait  training;Stair training;Functional mobility training;Therapeutic activities;Therapeutic exercise;Balance training;Patient/family education    PT Goals (Current goals can be found in the Care Plan section)  Acute Rehab PT Goals Patient Stated Goal: return home with family to assist PT Goal Formulation: With patient/family Time For Goal Achievement: 03/15/24 Potential to Achieve Goals: Good    Frequency Min 3X/week     Co-evaluation               AM-PAC PT "6 Clicks" Mobility  Outcome Measure Help needed turning from your back to your side while in a flat bed without using bedrails?: None Help needed moving from lying on your back to sitting on the side of a flat bed without using bedrails?: None Help needed moving to and from a bed to a chair (including a wheelchair)?: A Little Help needed standing up from a chair using your arms (e.g., wheelchair or bedside chair)?: A Little Help needed to walk in hospital room?: A Little Help needed climbing 3-5 steps with a railing? : A Lot 6 Click Score: 19    End of Session Equipment Utilized During Treatment: Oxygen Activity Tolerance: Patient tolerated treatment well;Patient limited by fatigue Patient left: in bed;with call bell/phone within reach;with family/visitor present Nurse Communication: Mobility status  PT Visit Diagnosis: Unsteadiness on feet (R26.81);Other abnormalities of gait and mobility (R26.89);Muscle weakness (generalized) (M62.81)    Time: 1610-9604 PT Time Calculation (min) (ACUTE ONLY): 20 min   Charges:   PT Evaluation $PT Eval Moderate Complexity: 1 Mod PT Treatments $Therapeutic Activity: 8-22 mins PT General Charges $$ ACUTE PT VISIT: 1 Visit         1:54 PM, 03/08/24 Walton Guppy, MPT Physical Therapist with [facility] [phone] office [phone] mobile phone

## 2024-03-08 NOTE — TOC Initial Note (Signed)
 Transition of Care [facility]) - Initial/Assessment Note    Patient Details  Name: Misty Briggs MRN: [id number] Date of Birth: 17-Oct-1951  Transition of Care [facility]) CM/SW Contact:    Villa Herb, LCSWA Phone Number: 03/08/2024, 10:38 AM  Clinical Narrative:                 Pt is high risk for readmission. PT is recommending HH PT. CSW spoke with pt at bedside to complete assessment. Pt lives with her husband and son. Pt states she is independent in completing his ADLs. Pt drives when needed to appointments. Pt states that she has had HH in the past and is agreeable to it at this time but does not have an agency preference. CSW spoke to [place] with [facility] who accepts [facility] PT referral, will need orders placed by MD prior to D/C. Pt wears 2L O2 at night supplied by [employer]. Pt has a walker to use when needed. TOC to follow.   Expected Discharge Plan: Home w Home Health Services Barriers to Discharge: Continued Medical Work up   Patient Goals and CMS Choice Patient states their goals for this hospitalization and ongoing recovery are:: return home CMS Medicare.gov Compare Post Acute Care list provided to:: Patient Choice offered to / list presented to : Patient      Expected Discharge Plan and Services In-house Referral: Clinical Social Work Discharge Planning Services: CM Consult Post Acute Care Choice: Home Health Living arrangements for the past 2 months: Single Family Home                           HH Arranged: PT HH Agency: [facility] Date HH Agency Contacted: 03/08/24   Representative spoke with at [facility] Agency: Adele Dan  Prior Living Arrangements/Services Living arrangements for the past 2 months: Single Family Home Lives with:: Adult Children, Spouse Patient language and need for interpreter reviewed:: Yes Do you feel safe going back to the place where you live?: Yes      Need for Family Participation in Patient Care: Yes (Comment) Care giver  support system in place?: Yes (comment) Current home services: DME Criminal Activity/Legal Involvement Pertinent to Current Situation/Hospitalization: No - Comment as needed  Activities of Daily Living   ADL Screening (condition at time of admission) Independently performs ADLs?: Yes (appropriate for developmental age) Is the patient deaf or have difficulty hearing?: No Does the patient have difficulty seeing, even when wearing glasses/contacts?: No Does the patient have difficulty concentrating, remembering, or making decisions?: No  Permission Sought/Granted                  Emotional Assessment Appearance:: Appears stated age Attitude/Demeanor/Rapport: Engaged Affect (typically observed): Accepting Orientation: : Oriented to Self, Oriented to Place, Oriented to  Time, Oriented to Situation Alcohol / Substance Use: Not Applicable Psych Involvement: No (comment)  Admission diagnosis:  Community acquired bacterial pneumonia [J15.9] COPD exacerbation (HCC) [J44.1] Community acquired pneumonia of left lower lobe of lung [J18.9] Patient Active Problem List   Diagnosis Date Noted   Community acquired bacterial pneumonia 03/06/2024   Nocturnal hypoxemia 01/31/2023   Chronic hypoxemic respiratory failure (HCC) 11/03/2020   AKI (acute kidney injury) (HCC) 10/29/2020   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 [place]  [place] [address] Phone: [phone] Fax: [phone]     Social Drivers of Health (SDOH) Social History: SDOH Screenings   Food Insecurity: No Food Insecurity (03/06/2024)  Housing: Low Risk  (03/06/2024)  Transportation Needs: No Transportation Needs (03/06/2024)  Utilities: Not At Risk (03/06/2024)  Financial Resource Strain: Patient Declined (03/22/2023)   Received from [facility], [facility]  Physical Activity: Unknown (09/28/2019)  Social Connections: Moderately Isolated (03/06/2024)  Tobacco Use: Medium Risk (03/06/2024)   SDOH Interventions:     Readmission Risk Interventions    03/08/2024   10:37 AM  Readmission Risk Prevention Plan  Transportation Screening Complete  HRI or Home Care Consult Complete  Social Work Consult for Recovery Care Planning/Counseling Complete  Palliative Care Screening Not Applicable  Medication Review Oceanographer) Complete

## 2024-03-08 NOTE — Progress Notes (Signed)
 Patient stated "I feel like I maybe getting thrush,I had it some time ago while on antibiotics", I assessed patient's mouth and I could see some white patches to upper cheek and roof of patient's mouth.Dr Mason Sole notified.Plan of care on going.

## 2024-03-08 NOTE — Progress Notes (Signed)
 PROGRESS NOTE    Misty Briggs  ZOX:[id number] DOB: 22-Apr-1951 DOA: 03/06/2024 PCP: Jerl Mina, MD   Brief Narrative:    The patient is a 73 year old Caucasian female with a past medical history significant for RA, asthma and other comorbidities who presented with a productive cough, nasal congestion and chest tightness. She was seen by pulmonary on 03/01/2024 and has been having productive cough and fever up to 102 at home. She is diagnosed with asthma exacerbation and fever of unknown origin and was given steroids and inhalers as well as azithromycin. Given her worsening symptoms with failure to improve she presented to the ED for further evaluation. Of note she had to be on supplemental oxygen consistently rather than just nightly. Further workup shows a left lower lobe pneumonia with positivity for parainfluenza. She has been placed on antibiotics, nebs, steroids and treatment as below and will continue to monitor closely.   Assessment & Plan:   Principal Problem:   Community acquired bacterial pneumonia  Assessment and Plan:   Acute on Chronic Respiratory Failure w/ Hypoxia in the setting of CAP with Underlying COPD and Asthma Exacerbation: See below. Will c/w Treatment with changes.  Continuous pulse oximetry and maintain O2 saturations greater than 90%.  Continue supplemental oxygen via nasal cannula wean O2 as tolerated.  Will need an amatory home O2 screen prior to discharge and repeat chest x-ray today is pending. - Respiratory panel positive for parainfluenza   Bacterial Community Acquired Pneumonia superimposed on parainfluenza infection: Patient recently seen by Pulmonology found to have concern for asthma exacerbation. Recently tried on azithromycin without improvement in symptoms. Chest x-ray on presentation showed infiltrate in the LLL w/ small Pleural effusion. Leukocytosis in setting of recent steroid prescription. C/w IV Ceftriaxone and Azithromycin. Schedule DuoNebs  changed to Xopenex/Atrovent q6h. C/w Arfomoterol 15 mcg BID and Budesonide 0.25 mg BID. C/w Solu-Medrol 40 mg IV every 12 hours. C/w Guaifenesin 1200 mg po BID, Incentive Spirometry, and Flutter Valve check respiratory virus panel and COVID, influenza A/B and RSV PCR -Procalcitonin 1.18   Rheumatoid Arthritis: C/w home Hydroxychloroquine 400 mg po and 200 mg po EOD. Previously was on Methotrexate and now being avoided due to its immunosuppressive effects that could exacerbate respiratory infections   Hyperlipidemia: C/w Pravastatin 20 mg po Daily    Chronic Pain: C/w Gabapentin 100 gm po Daily    CAD: C/w Aspirin 81 mg po Daily, Metoprolol Tartrate 50 mg po BID, Pravastatin 20 mg po Daily    Essential Hypertension: C/w Amlodipine 5 mg po Daily and Metoprolol Tartrate 50 mg po BID. CTM BP per Protocol. Last BP reading was 138/72   Leukocytosis-downtrending: In the setting of Steroid Demargination. -Continue to monitor CBC   CKD stage IIIb: BUN/Cr Trend, improving: Avoid Nephrotoxic Medications, Contrast Dyes, Hypotension and Dehydration to Ensure Adequate Renal Perfusion and will need to Renally Adjust Meds. CTM and Trend Renal Function carefully and repeat CMP in the AM    Normocytic Anemia: Hgb/Hct went from 12.8/39.1 -> 11.9/36.2. Check Anemia Panel in the AM. CTM for S/Sx of Bleeding; No overt bleeding noted. Repeat CBC in the AM   Diabetes Mellitus Type 2 with Neuropathy: Patient is on oral antiglycemic's at home w/ Metformin 1000 mg po BID.  Will hold and place on Sensitive Novolog SSI AC/HS given blood sugars to be elevated in the setting of Steroid Demargination. Check HbA1c in the AM. C/w Gabapentin 100 mg po qHS   GERD/GI Prophylaxis: C/w PPI with Pantoprazole 40  mg po Daily   Hypoalbuminemia: Patient's Albumin is now 2.7. CTM and Trend and repeat CMP in the AM   Overweight: Complicates overall prognosis and care. Estimated body mass index is 25.62 kg/m as calculated from the  following:   Height as of this encounter: 5\' 6"  (1.676 m).   Weight as of this encounter: 72 kg. Weight Loss and Dietary Counseling given    DVT prophylaxis:Lovenox Code Status: Full Family Communication: Daughter at bedside 4/17 Disposition Plan:  Status is: Inpatient Remains inpatient appropriate because: Need for IV medications.   Consultants:  None  Procedures:  None  Antimicrobials:  Anti-infectives (From admission, onward)    Start     Dose/Rate Route Frequency Ordered Stop   03/07/24 1000  hydroxychloroquine (PLAQUENIL) tablet 400 mg       Note to Pharmacy: Alternate daily with 1 tablet then the next day take 2 tablets   400 mg Oral Every other day 03/06/24 0503     03/06/24 1115  hydroxychloroquine (PLAQUENIL) tablet 200 mg        200 mg Oral Every other day 03/06/24 1018     03/06/24 1000  azithromycin (ZITHROMAX) tablet 500 mg        500 mg Oral Daily 03/06/24 0507     03/06/24 0900  cefTRIAXone (ROCEPHIN) 2 g in sodium chloride 0.9 % 100 mL IVPB        2 g 200 mL/hr over 30 Minutes Intravenous Every 24 hours 03/06/24 0507     03/06/24 0245  cefTRIAXone (ROCEPHIN) 1 g in sodium chloride 0.9 % 100 mL IVPB        1 g 200 mL/hr over 30 Minutes Intravenous  Once 03/06/24 0236 03/06/24 0355   03/06/24 0245  azithromycin (ZITHROMAX) 500 mg in sodium chloride 0.9 % 250 mL IVPB        500 mg 250 mL/hr over 60 Minutes Intravenous  Once 03/06/24 0236 03/06/24 1610       Subjective: Patient seen and evaluated today with no new acute complaints or concerns. No acute concerns or events noted overnight.  Continues to remain on 5 L nasal cannula oxygen typically is on room air and only uses 2 L nasal cannula at bedtime.  Objective: Vitals:   03/08/24 0858 03/08/24 0859 03/08/24 0904 03/08/24 1002  BP:    (!) 144/66  Pulse:    98  Resp:    19  Temp:    98.1 F (36.7 C)  TempSrc:    Oral  SpO2: 94% 98% 97% 95%  Weight:      Height:        Intake/Output Summary  (Last 24 hours) at 03/08/2024 1046 Last data filed at 03/07/2024 1610 Gross per 24 hour  Intake 240 ml  Output --  Net 240 ml   Filed Weights   03/06/24 0015  Weight: 72 kg    Examination:  General exam: Appears calm and comfortable  Respiratory system: Clear to auscultation. Respiratory effort normal.  5 L nasal cannula Cardiovascular system: S1 & S2 heard, RRR.  Gastrointestinal system: Abdomen is soft Central nervous system: Alert and awake Extremities: No edema Skin: No significant lesions noted Psychiatry: Flat affect.    Data Reviewed: I have personally reviewed following labs and imaging studies  CBC: Recent Labs  Lab 03/06/24 0051 03/07/24 0413 03/08/24 0348  WBC 23.6* 24.1* 19.4*  NEUTROABS  --   --  18.0*  HGB 12.8 11.9* 11.5*  HCT 39.1 36.2 34.8*  MCV 90.5 90.3 89.0  PLT 374 364 381   Basic Metabolic Panel: Recent Labs  Lab 03/06/24 0051 03/07/24 0413 03/08/24 0348  NA 139 136 137  K 3.9 4.1 3.9  CL 99 99 95*  CO2 26 29 28   GLUCOSE 163* 202* 209*  BUN 32* 28* 33*  CREATININE 1.47* 1.23* 1.13*  CALCIUM 9.2 8.8* 8.8*  MG  --   --  2.2  PHOS  --   --  3.5   GFR: Estimated Creatinine Clearance: 46.4 mL/min (A) (by C-G formula based on SCr of 1.13 mg/dL (H)). Liver Function Tests: Recent Labs  Lab 03/07/24 0413 03/08/24 0348  AST 15 18  ALT 21 26  ALKPHOS 94 96  BILITOT 0.6 0.4  PROT 6.3* 6.3*  ALBUMIN 2.7* 2.5*   No results for input(s): "LIPASE", "AMYLASE" in the last 168 hours. No results for input(s): "AMMONIA" in the last 168 hours. Coagulation Profile: No results for input(s): "INR", "PROTIME" in the last 168 hours. Cardiac Enzymes: No results for input(s): "CKTOTAL", "CKMB", "CKMBINDEX", "TROPONINI" in the last 168 hours. BNP (last 3 results) No results for input(s): "PROBNP" in the last 8760 hours. HbA1C: Recent Labs    03/08/24 0348  HGBA1C 6.4*   CBG: Recent Labs  Lab 03/07/24 1121 03/07/24 1626 03/07/24 2150  03/08/24 0724  GLUCAP 248* 226* 219* 199*   Lipid Profile: No results for input(s): "CHOL", "HDL", "LDLCALC", "TRIG", "CHOLHDL", "LDLDIRECT" in the last 72 hours. Thyroid Function Tests: No results for input(s): "TSH", "T4TOTAL", "FREET4", "T3FREE", "THYROIDAB" in the last 72 hours. Anemia Panel: No results for input(s): "VITAMINB12", "FOLATE", "FERRITIN", "TIBC", "IRON", "RETICCTPCT" in the last 72 hours. Sepsis Labs: Recent Labs  Lab 03/08/24 0348  PROCALCITON 1.18    Recent Results (from the past 240 hours)  Resp panel by RT-PCR (RSV, Flu A&B, Covid) Anterior Nasal Swab     Status: None   Collection Time: 03/07/24  5:15 PM   Specimen: Anterior Nasal Swab  Result Value Ref Range Status   SARS Coronavirus 2 by RT PCR NEGATIVE NEGATIVE Final    Comment: (NOTE) SARS-CoV-2 target nucleic acids are NOT DETECTED.  The SARS-CoV-2 RNA is generally detectable in upper respiratory specimens during the acute phase of infection. The lowest concentration of SARS-CoV-2 viral copies this assay can detect is 138 copies/mL. A negative result does not preclude SARS-Cov-2 infection and should not be used as the sole basis for treatment or other patient management decisions. A negative result may occur with  improper specimen collection/handling, submission of specimen other than nasopharyngeal swab, presence of viral mutation(s) within the areas targeted by this assay, and inadequate number of viral copies(<138 copies/mL). A negative result must be combined with clinical observations, patient history, and epidemiological information. The expected result is Negative.  Fact Sheet for Patients:  BloggerCourse.com  Fact Sheet for Healthcare Providers:  SeriousBroker.it  This test is no t yet approved or cleared by the [place] FDA and  has been authorized for detection and/or diagnosis of SARS-CoV-2 by FDA under an Emergency Use  Authorization (EUA). This EUA will remain  in effect (meaning this test can be used) for the duration of the COVID-19 declaration under Section 564(b)(1) of the Act, 21 U.S.C.section 360bbb-3(b)(1), unless the authorization is terminated  or revoked sooner.       Influenza A by PCR NEGATIVE NEGATIVE Final   Influenza B by PCR NEGATIVE NEGATIVE Final    Comment: (NOTE) The Xpert Xpress SARS-CoV-2/FLU/RSV plus assay is  intended as an aid in the diagnosis of influenza from Nasopharyngeal swab specimens and should not be used as a sole basis for treatment. Nasal washings and aspirates are unacceptable for Xpert Xpress SARS-CoV-2/FLU/RSV testing.  Fact Sheet for Patients: BloggerCourse.com  Fact Sheet for Healthcare Providers: SeriousBroker.it  This test is not yet approved or cleared by the [place]  FDA and has been authorized for detection and/or diagnosis of SARS-CoV-2 by FDA under an Emergency Use Authorization (EUA). This EUA will remain in effect (meaning this test can be used) for the duration of the COVID-19 declaration under Section 564(b)(1) of the Act, 21 U.S.C. section 360bbb-3(b)(1), unless the authorization is terminated or revoked.     Resp Syncytial Virus by PCR NEGATIVE NEGATIVE Final    Comment: (NOTE) Fact Sheet for Patients: BloggerCourse.com  Fact Sheet for Healthcare Providers: SeriousBroker.it  This test is not yet approved or cleared by the [place]  FDA and has been authorized for detection and/or diagnosis of SARS-CoV-2 by FDA under an Emergency Use Authorization (EUA). This EUA will remain in effect (meaning this test can be used) for the duration of the COVID-19 declaration under Section 564(b)(1) of the Act, 21 U.S.C. section 360bbb-3(b)(1), unless the authorization is terminated or revoked.  Performed at [facility], [address]  [place], [place] [address]   Respiratory (~20 pathogens) panel by PCR     Status: Abnormal   Collection Time: 03/07/24  6:55 PM  Result Value Ref Range Status   Adenovirus NOT DETECTED NOT DETECTED Final   Coronavirus 229E NOT DETECTED NOT DETECTED Final    Comment: (NOTE) The Coronavirus on the Respiratory Panel, DOES NOT test for the novel  Coronavirus (2019 nCoV)    Coronavirus HKU1 NOT DETECTED NOT DETECTED Final   Coronavirus NL63 NOT DETECTED NOT DETECTED Final   Coronavirus OC43 NOT DETECTED NOT DETECTED Final   Metapneumovirus NOT DETECTED NOT DETECTED Final   Rhinovirus / Enterovirus NOT DETECTED NOT DETECTED Final   Influenza A NOT DETECTED NOT DETECTED Final   Influenza B NOT DETECTED NOT DETECTED Final   Parainfluenza Virus 1 NOT DETECTED NOT DETECTED Final   Parainfluenza Virus 2 NOT DETECTED NOT DETECTED Final   Parainfluenza Virus 3 DETECTED (A) NOT DETECTED Final   Parainfluenza Virus 4 NOT DETECTED NOT DETECTED Final   Respiratory Syncytial Virus NOT DETECTED NOT DETECTED Final   Bordetella pertussis NOT DETECTED NOT DETECTED Final   Bordetella Parapertussis NOT DETECTED NOT DETECTED Final   Chlamydophila pneumoniae NOT DETECTED NOT DETECTED Final   Mycoplasma pneumoniae NOT DETECTED NOT DETECTED Final    Comment: Performed at [facility], [address], [place], [place] [address]         Radiology Studies: DG CHEST PORT 1 VIEW Result Date: 03/08/2024 CLINICAL DATA:  Shortness of breath EXAM: PORTABLE CHEST 1 VIEW COMPARISON:  03/07/2024 FINDINGS: Cardiac shadow is stable. Small left effusion is again identified and stable. Right lung remains clear. No bony abnormality is noted. Postsurgical changes in the cervical spine are seen. IMPRESSION: Persistent left effusion and basilar atelectasis. Electronically Signed   By: Violeta Grey M.D.   On: 03/08/2024 02:57   DG CHEST PORT 1 VIEW Result Date: 03/07/2024 CLINICAL DATA:  Shortness of breath. EXAM: PORTABLE  CHEST 1 VIEW COMPARISON:  Chest x-ray April 15, 25. FINDINGS: Similar left basilar opacities and small left pleural effusion. Right lung is clear. No visible pneumothorax. No acute bony abnormality. Partially imaged ACDF. IMPRESSION: Similar left basilar opacities  and small left pleural effusion. Electronically Signed   By: Stevenson Elbe M.D.   On: 03/07/2024 19:32        Scheduled Meds:  amLODipine  5 mg Oral Daily   arformoterol  15 mcg Nebulization BID   aspirin EC  81 mg Oral Daily   azithromycin  500 mg Oral Daily   budesonide (PULMICORT) nebulizer solution  0.25 mg Nebulization BID   enoxaparin (LOVENOX) injection  40 mg Subcutaneous Q24H   gabapentin  100 mg Oral QHS   guaiFENesin  1,200 mg Oral BID   hydroxychloroquine  200 mg Oral QODAY   hydroxychloroquine  400 mg Oral QODAY   insulin aspart  0-5 Units Subcutaneous QHS   insulin aspart  0-9 Units Subcutaneous TID WC   ipratropium  0.5 mg Nebulization Q6H   levalbuterol  0.63 mg Nebulization Q6H   methylPREDNISolone (SOLU-MEDROL) injection  40 mg Intravenous Q12H   metoprolol tartrate  50 mg Oral BID   pantoprazole  40 mg Oral Daily   pravastatin  20 mg Oral q1800   Continuous Infusions:  cefTRIAXone (ROCEPHIN)  IV 2 g (03/08/24 1021)     LOS: 2 days    Time spent: 55 minutes    Quince Santana Loran Rock, DO [facility]  If 7PM-7AM, please contact night-coverage [email or web address] 03/08/2024, 10:46 AM

## 2024-03-08 NOTE — Inpatient Diabetes Management (Signed)
 Inpatient Diabetes Program Recommendations  AACE/ADA: New Consensus Statement on Inpatient Glycemic Control (2015)  Target Ranges:  Prepandial:   less than 140 mg/dL      Peak postprandial:   less than 180 mg/dL (1-2 hours)      Critically ill patients:  140 - 180 mg/dL   Lab Results  Component Value Date   GLUCAP 199 (H) 03/08/2024   HGBA1C 7.4 (H) 09/28/2019    Review of Glycemic Control  Latest Reference Range & Units 03/07/24 11:21 03/07/24 16:26 03/07/24 21:50 03/08/24 07:24  Glucose-Capillary 70 - 99 mg/dL 409 (H) 811 (H) 914 (H) 199 (H)  (H): Data is abnormally high  Diabetes history: DM2 Outpatient Diabetes medications: Metformin 500 mg BID Current orders for Inpatient glycemic control: Novolog 0-9 units TID and 0-5 units at bedtime, Solumedrol 40 mg BID  Inpatient Diabetes Program Recommendations:    Might consider:  Novolog 0-15 units TID and HS.  Will continue to follow while inpatient.  Thank you, Hays Lipschutz, MSN, CDCES Diabetes Coordinator Inpatient Diabetes Program [phone] (team pager from 8a-5p)

## 2024-03-09 DIAGNOSIS — J159 Unspecified bacterial pneumonia: Secondary | ICD-10-CM | POA: Diagnosis not present

## 2024-03-09 LAB — CBC
HCT: 36.6 % (ref 36.0–46.0)
Hemoglobin: 11.7 g/dL — ABNORMAL LOW (ref 12.0–15.0)
MCH: 28.5 pg (ref 26.0–34.0)
MCHC: 32 g/dL (ref 30.0–36.0)
MCV: 89.3 fL (ref 80.0–100.0)
Platelets: 376 10*3/uL (ref 150–400)
RBC: 4.1 MIL/uL (ref 3.87–5.11)
RDW: 13.2 % (ref 11.5–15.5)
WBC: 14.4 10*3/uL — ABNORMAL HIGH (ref 4.0–10.5)
nRBC: 0 % (ref 0.0–0.2)

## 2024-03-09 LAB — MAGNESIUM: Magnesium: 2.2 mg/dL (ref 1.7–2.4)

## 2024-03-09 LAB — BASIC METABOLIC PANEL WITH GFR
Anion gap: 10 (ref 5–15)
BUN: 32 mg/dL — ABNORMAL HIGH (ref 8–23)
CO2: 29 mmol/L (ref 22–32)
Calcium: 8.4 mg/dL — ABNORMAL LOW (ref 8.9–10.3)
Chloride: 96 mmol/L — ABNORMAL LOW (ref 98–111)
Creatinine, Ser: 1.11 mg/dL — ABNORMAL HIGH (ref 0.44–1.00)
GFR, Estimated: 53 mL/min — ABNORMAL LOW (ref >60)
Glucose, Bld: 223 mg/dL — ABNORMAL HIGH (ref 70–99)
Potassium: 3.6 mmol/L (ref 3.5–5.1)
Sodium: 135 mmol/L (ref 135–145)

## 2024-03-09 LAB — GLUCOSE, CAPILLARY
Glucose-Capillary: 195 mg/dL — ABNORMAL HIGH (ref 70–99)
Glucose-Capillary: 167 mg/dL — ABNORMAL HIGH (ref 70–99)
Glucose-Capillary: 366 mg/dL — ABNORMAL HIGH (ref 70–99)
Glucose-Capillary: 256 mg/dL — ABNORMAL HIGH (ref 70–99)

## 2024-03-09 NOTE — Progress Notes (Signed)
 PROGRESS NOTE    Misty Briggs  WUJ:[id number] DOB: 1950/11/25 DOA: 03/06/2024 PCP: Lyle San, MD   Brief Narrative:    The patient is a 73 year old Caucasian female with a past medical history significant for RA, asthma and other comorbidities who presented with a productive cough, nasal congestion and chest tightness. She was seen by pulmonary on 03/01/2024 and has been having productive cough and fever up to 102 at home. She is diagnosed with asthma exacerbation and fever of unknown origin and was given steroids and inhalers as well as azithromycin . Given her worsening symptoms with failure to improve she presented to the ED for further evaluation. Of note she had to be on supplemental oxygen consistently rather than just nightly. Further workup shows a left lower lobe pneumonia with positivity for parainfluenza. She has been placed on antibiotics, nebs, steroids and treatment as below and will continue to monitor closely.   Assessment & Plan:   Principal Problem:   Community acquired bacterial pneumonia  Assessment and Plan:   Acute on Chronic Respiratory Failure w/ Hypoxia in the setting of CAP with Underlying COPD and Asthma Exacerbation: See below. Will c/w Treatment with changes.  Continuous pulse oximetry and maintain O2 saturations greater than 90%.  Continue supplemental oxygen via nasal cannula wean O2 as tolerated.  Will need an amatory home O2 screen prior to discharge and repeat chest x-ray today is pending. - Respiratory panel positive for parainfluenza -Continue to wean O2 to room air   Bacterial Community Acquired Pneumonia superimposed on parainfluenza infection: Patient recently seen by Pulmonology found to have concern for asthma exacerbation. Recently tried on azithromycin  without improvement in symptoms. Chest x-ray on presentation showed infiltrate in the LLL w/ small Pleural effusion. Leukocytosis in setting of recent steroid prescription. C/w IV Ceftriaxone  and  Azithromycin . Schedule DuoNebs changed to Xopenex /Atrovent  q6h. C/w Arfomoterol 15 mcg BID and Budesonide  0.25 mg BID. C/w Solu-Medrol  40 mg IV every 12 hours. C/w Guaifenesin  1200 mg po BID, Incentive Spirometry, and Flutter Valve check respiratory virus panel and COVID, influenza A/B and RSV PCR -Procalcitonin 1.18   Rheumatoid Arthritis: C/w home Hydroxychloroquine  400 mg po and 200 mg po EOD. Previously was on Methotrexate  and now being avoided due to its immunosuppressive effects that could exacerbate respiratory infections   Hyperlipidemia: C/w Pravastatin  20 mg po Daily    Chronic Pain: C/w Gabapentin  100 gm po Daily    CAD: C/w Aspirin  81 mg po Daily, Metoprolol  Tartrate 50 mg po BID, Pravastatin  20 mg po Daily    Essential Hypertension: C/w Amlodipine  5 mg po Daily and Metoprolol  Tartrate 50 mg po BID. CTM BP per Protocol. Last BP reading was 138/72   Leukocytosis-downtrending: In the setting of Steroid Demargination. -Continue to monitor CBC   CKD stage IIIb: BUN/Cr Trend, improving: Avoid Nephrotoxic Medications, Contrast Dyes, Hypotension and Dehydration to Ensure Adequate Renal Perfusion and will need to Renally Adjust Meds. CTM and Trend Renal Function carefully and repeat CMP in the AM    Normocytic Anemia: Hgb/Hct went from 12.8/39.1 -> 11.9/36.2. Check Anemia Panel in the AM. CTM for S/Sx of Bleeding; No overt bleeding noted. Repeat CBC in the AM   Diabetes Mellitus Type 2 with Neuropathy: Patient is on oral antiglycemic's at home w/ Metformin  1000 mg po BID.  Will hold and place on Sensitive Novolog  SSI AC/HS given blood sugars to be elevated in the setting of Steroid Demargination. Check HbA1c in the AM. C/w Gabapentin  100 mg po qHS  GERD/GI Prophylaxis: C/w PPI with Pantoprazole  40 mg po Daily   Hypoalbuminemia: Patient's Albumin is now 2.7. CTM and Trend and repeat CMP in the AM   Overweight: Complicates overall prognosis and care. Estimated body mass index is 25.62  kg/m as calculated from the following:   Height as of this encounter: 5\' 6"  (1.676 m).   Weight as of this encounter: 72 kg. Weight Loss and Dietary Counseling given    DVT prophylaxis:Lovenox  Code Status: Full Family Communication: Daughter at bedside 4/18 Disposition Plan:  Status is: Inpatient Remains inpatient appropriate because: Need for IV medications.   Consultants:  None  Procedures:  None  Antimicrobials:  Anti-infectives (From admission, onward)    Start     Dose/Rate Route Frequency Ordered Stop   03/07/24 1000  hydroxychloroquine  (PLAQUENIL ) tablet 400 mg       Note to Pharmacy: Alternate daily with 1 tablet then the next day take 2 tablets   400 mg Oral Every other day 03/06/24 0503     03/06/24 1115  hydroxychloroquine  (PLAQUENIL ) tablet 200 mg        200 mg Oral Every other day 03/06/24 1018     03/06/24 1000  azithromycin  (ZITHROMAX ) tablet 500 mg        500 mg Oral Daily 03/06/24 0507     03/06/24 0900  cefTRIAXone  (ROCEPHIN ) 2 g in sodium chloride  0.9 % 100 mL IVPB        2 g 200 mL/hr over 30 Minutes Intravenous Every 24 hours 03/06/24 0507     03/06/24 0245  cefTRIAXone  (ROCEPHIN ) 1 g in sodium chloride  0.9 % 100 mL IVPB        1 g 200 mL/hr over 30 Minutes Intravenous  Once 03/06/24 0236 03/06/24 0355   03/06/24 0245  azithromycin  (ZITHROMAX ) 500 mg in sodium chloride  0.9 % 250 mL IVPB        500 mg 250 mL/hr over 60 Minutes Intravenous  Once 03/06/24 0236 03/06/24 1610       Subjective: Patient seen and evaluated today with no new acute complaints or concerns. No acute concerns or events noted overnight.  Continues to remain on 3 L nasal cannula oxygen typically is on room air and only uses 2 L nasal cannula at bedtime.  Objective: Vitals:   03/08/24 2008 03/08/24 2040 03/09/24 0500 03/09/24 0807  BP:  138/73 134/67   Pulse:  95 82   Resp:  (!) 24 18   Temp:  98.9 F (37.2 C) 97.9 F (36.6 C)   TempSrc:  Oral Oral   SpO2: 93% 94% 95% 92%   Weight:      Height:        Intake/Output Summary (Last 24 hours) at 03/09/2024 1102 Last data filed at 03/09/2024 0941 Gross per 24 hour  Intake 720 ml  Output --  Net 720 ml   Filed Weights   03/06/24 0015  Weight: 72 kg    Examination:  General exam: Appears calm and comfortable  Respiratory system: Clear to auscultation. Respiratory effort normal.  3 L nasal cannula Cardiovascular system: S1 & S2 heard, RRR.  Gastrointestinal system: Abdomen is soft Central nervous system: Alert and awake Extremities: No edema Skin: No significant lesions noted Psychiatry: Flat affect.    Data Reviewed: I have personally reviewed following labs and imaging studies  CBC: Recent Labs  Lab 03/06/24 0051 03/07/24 0413 03/08/24 0348 03/09/24 0509  WBC 23.6* 24.1* 19.4* 14.4*  NEUTROABS  --   --  18.0*  --   HGB 12.8 11.9* 11.5* 11.7*  HCT 39.1 36.2 34.8* 36.6  MCV 90.5 90.3 89.0 89.3  PLT 374 364 381 376   Basic Metabolic Panel: Recent Labs  Lab 03/06/24 0051 03/07/24 0413 03/08/24 0348 03/09/24 0509  NA 139 136 137 135  K 3.9 4.1 3.9 3.6  CL 99 99 95* 96*  CO2 26 29 28 29   GLUCOSE 163* 202* 209* 223*  BUN 32* 28* 33* 32*  CREATININE 1.47* 1.23* 1.13* 1.11*  CALCIUM  9.2 8.8* 8.8* 8.4*  MG  --   --  2.2 2.2  PHOS  --   --  3.5  --    GFR: Estimated Creatinine Clearance: 47.3 mL/min (A) (by C-G formula based on SCr of 1.11 mg/dL (H)). Liver Function Tests: Recent Labs  Lab 03/07/24 0413 03/08/24 0348  AST 15 18  ALT 21 26  ALKPHOS 94 96  BILITOT 0.6 0.4  PROT 6.3* 6.3*  ALBUMIN 2.7* 2.5*   No results for input(s): "LIPASE", "AMYLASE" in the last 168 hours. No results for input(s): "AMMONIA" in the last 168 hours. Coagulation Profile: No results for input(s): "INR", "PROTIME" in the last 168 hours. Cardiac Enzymes: No results for input(s): "CKTOTAL", "CKMB", "CKMBINDEX", "TROPONINI" in the last 168 hours. BNP (last 3 results) No results for input(s):  "PROBNP" in the last 8760 hours. HbA1C: Recent Labs    03/08/24 0348  HGBA1C 6.4*   CBG: Recent Labs  Lab 03/08/24 0724 03/08/24 1112 03/08/24 1634 03/08/24 2043 03/09/24 0734  GLUCAP 199* 329* 131* 226* 195*   Lipid Profile: No results for input(s): "CHOL", "HDL", "LDLCALC", "TRIG", "CHOLHDL", "LDLDIRECT" in the last 72 hours. Thyroid Function Tests: No results for input(s): "TSH", "T4TOTAL", "FREET4", "T3FREE", "THYROIDAB" in the last 72 hours. Anemia Panel: No results for input(s): "VITAMINB12", "FOLATE", "FERRITIN", "TIBC", "IRON", "RETICCTPCT" in the last 72 hours. Sepsis Labs: Recent Labs  Lab 03/08/24 0348  PROCALCITON 1.18    Recent Results (from the past 240 hours)  Resp panel by RT-PCR (RSV, Flu A&B, Covid) Anterior Nasal Swab     Status: None   Collection Time: 03/07/24  5:15 PM   Specimen: Anterior Nasal Swab  Result Value Ref Range Status   SARS Coronavirus 2 by RT PCR NEGATIVE NEGATIVE Final    Comment: (NOTE) SARS-CoV-2 target nucleic acids are NOT DETECTED.  The SARS-CoV-2 RNA is generally detectable in upper respiratory specimens during the acute phase of infection. The lowest concentration of SARS-CoV-2 viral copies this assay can detect is 138 copies/mL. A negative result does not preclude SARS-Cov-2 infection and should not be used as the sole basis for treatment or other patient management decisions. A negative result may occur with  improper specimen collection/handling, submission of specimen other than nasopharyngeal swab, presence of viral mutation(s) within the areas targeted by this assay, and inadequate number of viral copies(<138 copies/mL). A negative result must be combined with clinical observations, patient history, and epidemiological information. The expected result is Negative.  Fact Sheet for Patients:  BloggerCourse.com  Fact Sheet for Healthcare Providers:   SeriousBroker.it  This test is no t yet approved or cleared by the [place]  FDA and  has been authorized for detection and/or diagnosis of SARS-CoV-2 by FDA under an Emergency Use Authorization (EUA). This EUA will remain  in effect (meaning this test can be used) for the duration of the COVID-19 declaration under Section 564(b)(1) of the Act, 21 U.S.C.section 360bbb-3(b)(1), unless the authorization is terminated  or  revoked sooner.       Influenza A by PCR NEGATIVE NEGATIVE Final   Influenza B by PCR NEGATIVE NEGATIVE Final    Comment: (NOTE) The Xpert Xpress SARS-CoV-2/FLU/RSV plus assay is intended as an aid in the diagnosis of influenza from Nasopharyngeal swab specimens and should not be used as a sole basis for treatment. Nasal washings and aspirates are unacceptable for Xpert Xpress SARS-CoV-2/FLU/RSV testing.  Fact Sheet for Patients: BloggerCourse.com  Fact Sheet for Healthcare Providers: SeriousBroker.it  This test is not yet approved or cleared by the [place]  FDA and has been authorized for detection and/or diagnosis of SARS-CoV-2 by FDA under an Emergency Use Authorization (EUA). This EUA will remain in effect (meaning this test can be used) for the duration of the COVID-19 declaration under Section 564(b)(1) of the Act, 21 U.S.C. section 360bbb-3(b)(1), unless the authorization is terminated or revoked.     Resp Syncytial Virus by PCR NEGATIVE NEGATIVE Final    Comment: (NOTE) Fact Sheet for Patients: BloggerCourse.com  Fact Sheet for Healthcare Providers: SeriousBroker.it  This test is not yet approved or cleared by the [place]  FDA and has been authorized for detection and/or diagnosis of SARS-CoV-2 by FDA under an Emergency Use Authorization (EUA). This EUA will remain in effect (meaning this test can be used) for  the duration of the COVID-19 declaration under Section 564(b)(1) of the Act, 21 U.S.C. section 360bbb-3(b)(1), unless the authorization is terminated or revoked.  Performed at [facility], [address], [place], [place] [address]   Respiratory (~20 pathogens) panel by PCR     Status: Abnormal   Collection Time: 03/07/24  6:55 PM  Result Value Ref Range Status   Adenovirus NOT DETECTED NOT DETECTED Final   Coronavirus 229E NOT DETECTED NOT DETECTED Final    Comment: (NOTE) The Coronavirus on the Respiratory Panel, DOES NOT test for the novel  Coronavirus (2019 nCoV)    Coronavirus HKU1 NOT DETECTED NOT DETECTED Final   Coronavirus NL63 NOT DETECTED NOT DETECTED Final   Coronavirus OC43 NOT DETECTED NOT DETECTED Final   Metapneumovirus NOT DETECTED NOT DETECTED Final   Rhinovirus / Enterovirus NOT DETECTED NOT DETECTED Final   Influenza A NOT DETECTED NOT DETECTED Final   Influenza B NOT DETECTED NOT DETECTED Final   Parainfluenza Virus 1 NOT DETECTED NOT DETECTED Final   Parainfluenza Virus 2 NOT DETECTED NOT DETECTED Final   Parainfluenza Virus 3 DETECTED (A) NOT DETECTED Final   Parainfluenza Virus 4 NOT DETECTED NOT DETECTED Final   Respiratory Syncytial Virus NOT DETECTED NOT DETECTED Final   Bordetella pertussis NOT DETECTED NOT DETECTED Final   Bordetella Parapertussis NOT DETECTED NOT DETECTED Final   Chlamydophila pneumoniae NOT DETECTED NOT DETECTED Final   Mycoplasma pneumoniae NOT DETECTED NOT DETECTED Final    Comment: Performed at [facility], [address] [place], [place] [address]         Radiology Studies: DG CHEST PORT 1 VIEW Result Date: 03/08/2024 CLINICAL DATA:  Shortness of breath EXAM: PORTABLE CHEST 1 VIEW COMPARISON:  03/07/2024 FINDINGS: Cardiac shadow is stable. Small left effusion is again identified and stable. Right lung remains clear. No bony abnormality is noted. Postsurgical changes in the cervical spine are seen. IMPRESSION:  Persistent left effusion and basilar atelectasis. Electronically Signed   By: Violeta Grey M.D.   On: 03/08/2024 02:57   DG CHEST PORT 1 VIEW Result Date: 03/07/2024 CLINICAL DATA:  Shortness of breath. EXAM: PORTABLE CHEST 1 VIEW  COMPARISON:  Chest x-ray April 15, 25. FINDINGS: Similar left basilar opacities and small left pleural effusion. Right lung is clear. No visible pneumothorax. No acute bony abnormality. Partially imaged ACDF. IMPRESSION: Similar left basilar opacities and small left pleural effusion. Electronically Signed   By: Stevenson Elbe M.D.   On: 03/07/2024 19:32        Scheduled Meds:  amLODipine   5 mg Oral Daily   arformoterol   15 mcg Nebulization BID   aspirin  EC  81 mg Oral Daily   azithromycin   500 mg Oral Daily   budesonide  (PULMICORT ) nebulizer solution  0.25 mg Nebulization BID   enoxaparin  (LOVENOX ) injection  40 mg Subcutaneous Q24H   gabapentin   100 mg Oral QHS   guaiFENesin   1,200 mg Oral BID   hydroxychloroquine   200 mg Oral QODAY   hydroxychloroquine   400 mg Oral QODAY   insulin  aspart  0-15 Units Subcutaneous TID WC   insulin  aspart  0-5 Units Subcutaneous QHS   ipratropium  0.5 mg Nebulization Q6H   levalbuterol   0.63 mg Nebulization Q6H   methylPREDNISolone  (SOLU-MEDROL ) injection  40 mg Intravenous Q12H   metoprolol  tartrate  50 mg Oral BID   nystatin   5 mL Oral QID   pantoprazole   40 mg Oral Daily   pravastatin   20 mg Oral q1800   Continuous Infusions:  cefTRIAXone  (ROCEPHIN )  IV 2 g (03/09/24 0859)     LOS: 3 days    Time spent: 55 minutes    Christerpher Clos D Mason Sole, DO [facility]  If 7PM-7AM, please contact night-coverage [email or web address] 03/09/2024, 11:02 AM

## 2024-03-09 NOTE — Care Management Important Message (Signed)
 Important Message  Patient Details  Name: Misty Briggs MRN: [id number] Date of Birth: [birth year]   Important Message Given:  Yes - Medicare IM     Kohner Orlick L Dalante Minus 03/09/2024, 2:42 PM

## 2024-03-09 NOTE — Plan of Care (Signed)
   Problem: Education: Goal: Knowledge of General Education information will improve Description Including pain rating scale, medication(s)/side effects and non-pharmacologic comfort measures Outcome: Progressing   Problem: Health Behavior/Discharge Planning: Goal: Ability to manage health-related needs will improve Outcome: Progressing

## 2024-03-09 NOTE — Progress Notes (Signed)
 Physical Therapy Treatment Patient Details Name: Misty Briggs MRN: [id number] DOB: 07/22/1951 Today's Date: 03/09/2024   History of Present Illness Misty Briggs is a 72 y.o. female with medical history significant of rheumatoid arthritis, asthma who presented with productive cough nasal congestion chest tightness.  Patient was seen by pulmonology on 4/10.  At times she been having productive cough and fever at home up to 102.  She was taking her inhalers but her symptoms fail to improve.  She presented to outpatient clinic for evaluation.  She was diagnosed with a asthma exacerbation given prednisone  and inhalers.  She was given azithromycin  due to concern for bacterial infection.  She presented to the emergency department due to Failure symptoms to improve.  She states she typically wears 2 L of oxygen.  She has had progressively worsening dyspnea congestion and cough.  On arrival to the emergency department she was afebrile and hemodynamically stable.  Labs were obtained on presentation which showed creatinine 1.47 at baseline, WBC 23.6, hemoglobin 12.8.  Troponins X.  Blood gas showed pH 7.39, BNP 397.  Patient underwent chest x-ray which showed left lower lobe airspace disease concerning for infiltrate.  She was started on ceftriaxone  azithromycin  due to under lying allergies    PT Comments  Patient standing at bedside at beginning of session. Patient on O2 for session. Patient able to ambulate increased distance without AD today with mild intermittent unsteadiness. No loss of balance. Patient returned to room at end of session. Patient will benefit from continued skilled physical therapy in hospital and recommended venue below to increase strength, balance, endurance for safe ADLs and gait.    If plan is discharge home, recommend the following: A little help with walking and/or transfers;A little help with bathing/dressing/bathroom;Help with stairs or ramp for entrance;Assistance with  cooking/housework   Can travel by private vehicle        Equipment Recommendations  None recommended by PT    Recommendations for Other Services       Precautions / Restrictions Precautions Precautions: Fall Restrictions Weight Bearing Restrictions Per Provider Order: No     Mobility  Bed Mobility                    Transfers Overall transfer level: Needs assistance Equipment used: None Transfers: Sit to/from Stand Sit to Stand: Modified independent (Device/Increase time)   Step pivot transfers: Modified independent (Device/Increase time)            Ambulation/Gait Ambulation/Gait assistance: Modified independent (Device/Increase time) Gait Distance (Feet): 180 Feet Assistive device: None Gait Pattern/deviations: Decreased step length - left, Decreased stance time - right, Decreased stride length Gait velocity: decreased     General Gait Details: ambulates without AD, mild unsteadiness intermittently   Stairs             Wheelchair Mobility     Tilt Bed    Modified Rankin (Stroke Patients Only)       Balance           Standing balance support: No upper extremity supported, During functional activity Standing balance-Leahy Scale: Good Standing balance comment: good/fair without AD                            Communication Communication Communication: No apparent difficulties  Cognition Arousal: Alert Behavior During Therapy: WFL for tasks assessed/performed   PT - Cognitive impairments: No apparent impairments  Following commands: Intact      Cueing Cueing Techniques: Verbal cues  Exercises      General Comments        Pertinent Vitals/Pain Pain Assessment Pain Assessment: No/denies pain    Home Living                          Prior Function            PT Goals (current goals can now be found in the care plan section) Acute Rehab PT Goals Patient  Stated Goal: return home with family to assist PT Goal Formulation: With patient/family Time For Goal Achievement: 03/15/24 Potential to Achieve Goals: Good Progress towards PT goals: Progressing toward goals    Frequency    Min 3X/week      PT Plan      Co-evaluation PT/OT/SLP Co-Evaluation/Treatment: Yes Reason for Co-Treatment: To address functional/ADL transfers PT goals addressed during session: Balance;Strengthening/ROM;Mobility/safety with mobility        AM-PAC PT "6 Clicks" Mobility   Outcome Measure  Help needed turning from your back to your side while in a flat bed without using bedrails?: None Help needed moving from lying on your back to sitting on the side of a flat bed without using bedrails?: None Help needed moving to and from a bed to a chair (including a wheelchair)?: A Little Help needed standing up from a chair using your arms (e.g., wheelchair or bedside chair)?: A Little Help needed to walk in hospital room?: A Little Help needed climbing 3-5 steps with a railing? : A Lot 6 Click Score: 19    End of Session Equipment Utilized During Treatment: Oxygen Activity Tolerance: Patient tolerated treatment well;Patient limited by fatigue Patient left: in bed;with call bell/phone within reach;with family/visitor present Nurse Communication: Mobility status PT Visit Diagnosis: Unsteadiness on feet (R26.81);Other abnormalities of gait and mobility (R26.89);Muscle weakness (generalized) (M62.81)     Time: 1610-9604 PT Time Calculation (min) (ACUTE ONLY): 11 min  Charges:    $Therapeutic Activity: 8-22 mins PT General Charges $$ ACUTE PT VISIT: 1 Visit                     10:45 AM, 03/09/24 Beather Liming PT, DPT Physical Therapist at [facility]

## 2024-03-09 NOTE — Progress Notes (Signed)
 OT Cancellation Note  Patient Details Name: Misty Briggs MRN: [id number] DOB: 1951-02-23   Cancelled Treatment:    Reason Eval/Treat Not Completed: OT screened, no needs identified, will sign off. Pt up and moving about the room independently when this therapist arrived. Pt reports ability to care for herself independently. Pt also states she can work on UE strengthening on her own at home. Pt is not in need of further OT services and will be removed form the OT list.   JAYSON PERSON OT, MOT   Jayson Person 03/09/2024, 11:27 AM

## 2024-03-10 DIAGNOSIS — J159 Unspecified bacterial pneumonia: Secondary | ICD-10-CM | POA: Diagnosis not present

## 2024-03-10 LAB — BASIC METABOLIC PANEL WITH GFR
Anion gap: 14 (ref 5–15)
BUN: 33 mg/dL — ABNORMAL HIGH (ref 8–23)
CO2: 27 mmol/L (ref 22–32)
Calcium: 8.5 mg/dL — ABNORMAL LOW (ref 8.9–10.3)
Chloride: 98 mmol/L (ref 98–111)
Creatinine, Ser: 1.09 mg/dL — ABNORMAL HIGH (ref 0.44–1.00)
GFR, Estimated: 54 mL/min — ABNORMAL LOW (ref >60)
Glucose, Bld: 195 mg/dL — ABNORMAL HIGH (ref 70–99)
Potassium: 4.4 mmol/L (ref 3.5–5.1)
Sodium: 139 mmol/L (ref 135–145)

## 2024-03-10 LAB — CBC
HCT: 35.2 % — ABNORMAL LOW (ref 36.0–46.0)
Hemoglobin: 11.2 g/dL — ABNORMAL LOW (ref 12.0–15.0)
MCH: 28.5 pg (ref 26.0–34.0)
MCHC: 31.8 g/dL (ref 30.0–36.0)
MCV: 89.6 fL (ref 80.0–100.0)
Platelets: 369 10*3/uL (ref 150–400)
RBC: 3.93 MIL/uL (ref 3.87–5.11)
RDW: 13.2 % (ref 11.5–15.5)
WBC: 16.6 10*3/uL — ABNORMAL HIGH (ref 4.0–10.5)
nRBC: 0 % (ref 0.0–0.2)

## 2024-03-10 LAB — MAGNESIUM: Magnesium: 2.3 mg/dL (ref 1.7–2.4)

## 2024-03-10 LAB — GLUCOSE, CAPILLARY
Glucose-Capillary: 169 mg/dL — ABNORMAL HIGH (ref 70–99)
Glucose-Capillary: 224 mg/dL — ABNORMAL HIGH (ref 70–99)

## 2024-03-10 MED ORDER — LIDOCAINE 5 % EX PTCH: 1.0000 | MEDICATED_PATCH | Freq: Every day | CUTANEOUS | 0 refills | Status: AC

## 2024-03-10 MED ORDER — CEFDINIR 300 MG PO CAPS: 300.0000 mg | ORAL_CAPSULE | Freq: Two times a day (BID) | ORAL | 0 refills | Status: AC

## 2024-03-10 MED ORDER — GUAIFENESIN-DM 100-10 MG/5ML PO SYRP: 5.0000 mL | ORAL_SOLUTION | ORAL | 0 refills | Status: AC | PRN

## 2024-03-10 MED ORDER — LIDOCAINE 5 % EX PTCH
1.0000 | MEDICATED_PATCH | Freq: Every day | CUTANEOUS | Status: DC
  Administered 2024-03-10: 1 via TRANSDERMAL
  Filled 2024-03-10: qty 1

## 2024-03-10 MED ORDER — METHOCARBAMOL 500 MG PO TABS: 500.0000 mg | ORAL_TABLET | Freq: Three times a day (TID) | ORAL | Status: DC | PRN

## 2024-03-10 MED ORDER — PREDNISONE 10 MG PO TABS: 40.0000 mg | ORAL_TABLET | Freq: Every day | ORAL | 0 refills | Status: AC

## 2024-03-10 NOTE — Progress Notes (Signed)
 DC [facility] per [facility]. Awaiting orders for pain management. MD informed daughter states takes norco and fentanyl  patches at home for pain management.

## 2024-03-10 NOTE — Progress Notes (Signed)
 Mobility Specialist Progress Note:    03/10/24 1230  Mobility  Activity Ambulated with assistance in hallway  Level of Assistance Modified independent, requires aide device or extra time  Assistive Device None  Distance Ambulated (ft) 140 ft  Range of Motion/Exercises Active;All extremities  Activity Response Tolerated well  Mobility Referral Yes  Mobility visit 1 Mobility  Mobility Specialist Start Time (ACUTE ONLY) 1230  Mobility Specialist Stop Time (ACUTE ONLY) 1250  Mobility Specialist Time Calculation (min) (ACUTE ONLY) 20 min   Pt received sitting EOB, agreeable to mobility. ModI to stand and ambulate with no AD. Tolerated well, SpO2 92-95% on RA during ambulation. Left pt on RA, RN notified. Left pt sitting EOB, all needs met.  Lysa Livengood Mobility Specialist Please contact via Special educational needs teacher or  Rehab office at [phone]

## 2024-03-10 NOTE — Plan of Care (Signed)
   Problem: Nutrition: Goal: Adequate nutrition will be maintained Outcome: Progressing   Problem: Pain Managment: Goal: General experience of comfort will improve and/or be controlled Outcome: Progressing   Problem: Safety: Goal: Ability to remain free from injury will improve Outcome: Progressing

## 2024-03-10 NOTE — Progress Notes (Signed)
 Mobility Specialist Progress Note:    03/10/24 1030  Mobility  Activity Ambulated with assistance in hallway  Level of Assistance Modified independent, requires aide device or extra time  Assistive Device None  Distance Ambulated (ft) 140 ft  Range of Motion/Exercises Active;All extremities  Activity Response Tolerated well  Mobility Referral Yes  Mobility visit 1 Mobility  Mobility Specialist Start Time (ACUTE ONLY) 1030  Mobility Specialist Stop Time (ACUTE ONLY) 1050  Mobility Specialist Time Calculation (min) (ACUTE ONLY) 20 min   Pt received sitting EOB, family in room. Agreeable to mobility, ModI to stand and ambualte with no AD. Tolerated well, SpO2 94-95% on 2L during ambulation. Returned pt sitting EOB, all needs met.  Rylen Hou Mobility Specialist Please contact via Special educational needs teacher or  Rehab office at [phone]

## 2024-03-10 NOTE — Discharge Summary (Signed)
 Physician Discharge Summary  Misty Briggs NWG:[id number] DOB: [birth year] DOA: 03/06/2024  PCP: Misty San, MD  Admit date: 03/06/2024  Discharge date: 03/10/2024  Admitted From:Home  Disposition:  Home  Recommendations for Outpatient Follow-up:  Follow up with PCP in 1-2 weeks Continue on antibiotics as prescribed to complete course of treatment Continue prednisone  as prescribed Continue other home medications as prior  Home Health: Home health PT  Equipment/Devices: Has home oxygen 2 L at bedtime  Discharge Condition:Stable  CODE STATUS: Full  Diet recommendation: Heart Healthy  Brief/Interim Summary:  The patient is a 73 year old Caucasian female with a past medical history significant for RA, asthma and other comorbidities who presented with a productive cough, nasal congestion and chest tightness. She was seen by pulmonary on 03/01/2024 and has been having productive cough and fever up to 102 at home. She is diagnosed with asthma exacerbation and fever of unknown origin and was given steroids and inhalers as well as azithromycin . Given her worsening symptoms with failure to improve she presented to the ED for further evaluation. Of note she had to be on supplemental oxygen consistently rather than just nightly. Further workup shows a left lower lobe pneumonia with positivity for parainfluenza.  She was treated with IV steroids as well as antibiotics over the course of 4 days and has shown significant improvement and does not have a current oxygen requirement.  She is overall feeling better and in stable condition for discharge to home with oral antibiotics and prednisone  to complete course of treatment.  No other acute events or concerns noted.  Discharge Diagnoses:  Principal Problem:   Community acquired bacterial pneumonia  Principal discharge diagnosis: Acute on chronic hypoxemic respiratory failure in the setting of parainfluenza infection with superimposed  CAP.  Discharge Instructions  Discharge Instructions     Diet - low sodium heart healthy   Complete by: As directed    Increase activity slowly   Complete by: As directed       Allergies as of 03/10/2024       Reactions   Doxycycline  Other (See Comments)   Thrush   Penicillins Hives, Palpitations, Rash   Azithromycin  Other (See Comments)   Thrush   Levofloxacin  Other (See Comments)   Joint pain/muscle pain   Sulfa Antibiotics Rash        Medication List     STOP taking these medications    magnesium  gluconate 500 MG tablet Commonly known as: MAGONATE       TAKE these medications    albuterol  108 (90 Base) MCG/ACT inhaler Commonly known as: VENTOLIN  HFA Inhale 1-2 puffs into the lungs every 6 (six) hours as needed for wheezing or shortness of breath.   albuterol  (2.5 MG/3ML) 0.083% nebulizer solution Commonly known as: PROVENTIL  Twice daily for 3-5 days until better   amLODipine  5 MG tablet Commonly known as: NORVASC  Take 1 tablet (5 mg total) by mouth daily.   aspirin  EC 81 MG tablet Take by mouth daily.   Breztri  Aerosphere 160-9-4.8 MCG/ACT Aero inhaler Generic drug: budeson-glycopyrrolate -formoterol Inhale 2 puffs into the lungs in the morning and at bedtime.   calcium  carbonate 1250 (500 Ca) MG tablet Commonly known as: OS-CAL - dosed in mg of elemental calcium  Take 2 tablets by mouth daily with breakfast.   cefdinir  300 MG capsule Commonly known as: OMNICEF  Take 1 capsule (300 mg total) by mouth 2 (two) times daily for 2 days.   CO Q-10 PO Take by mouth daily.  cyanocobalamin  1000 MCG tablet Commonly known as: VITAMIN B12 Take 1,000 mcg by mouth daily.   EPINEPHrine 0.3 mg/0.3 mL Soaj injection Commonly known as: EPI-PEN Inject 0.3 mLs into the muscle once as needed for anaphylaxis.   ferrous sulfate  325 (65 FE) MG tablet Take 1 tablet (325 mg total) by mouth 2 (two) times daily with a meal.   fluticasone  50 MCG/ACT nasal  spray Commonly known as: FLONASE  Place 1 spray into both nostrils daily as needed for allergies or rhinitis.   furosemide  20 MG tablet Commonly known as: LASIX  Take 20 mg by mouth daily.   gabapentin  100 MG capsule Commonly known as: NEURONTIN  Take 3 capsules by mouth at bedtime.   guaiFENesin -dextromethorphan  100-10 MG/5ML syrup Commonly known as: ROBITUSSIN DM Take 5 mLs by mouth every 4 (four) hours as needed for cough (chest congestion).   hydroxychloroquine  200 MG tablet Commonly known as: PLAQUENIL  Monday -Friday take 2 tablets Saturday and Sunday take 1 tablet   hydrOXYzine  25 MG tablet Commonly known as: ATARAX  Take 25 mg by mouth every evening.   levocetirizine 5 MG tablet Commonly known as: XYZAL  Take 5 mg by mouth every evening.   lidocaine  5 % Commonly known as: LIDODERM  Place 1 patch onto the skin daily. Remove & Discard patch within 12 hours or as directed by MD Start taking on: March 11, 2024   lovastatin 20 MG tablet Commonly known as: MEVACOR Take 20 mg by mouth at bedtime.   metFORMIN  1000 MG tablet Commonly known as: GLUCOPHAGE  Take 1 tablet (1,000 mg total) by mouth 2 (two) times daily with a meal. What changed: how much to take   metoprolol  tartrate 50 MG tablet Commonly known as: LOPRESSOR  Take 50 mg by mouth 2 (two) times daily.   montelukast  10 MG tablet Commonly known as: SINGULAIR  1 tablet in the evening   multivitamin capsule Take 1 capsule by mouth daily.   omeprazole 20 MG capsule Commonly known as: PRILOSEC Take 20 mg by mouth daily.   potassium chloride  10 MEQ tablet Commonly known as: KLOR-CON  Take 10 mEq by mouth daily.   predniSONE  10 MG tablet Commonly known as: DELTASONE  Take 4 tablets (40 mg total) by mouth daily for 5 days. What changed:  how much to take how to take this when to take this additional instructions   vitamin C  1000 MG tablet Take 1,000 mg by mouth daily.        Follow-up Information      [facility] Follow up.   Why: Agency will call to set up first home therapy appointment. Contact information: [address] [place]  [address] [phone]        Misty San, MD. Schedule an appointment as soon as possible for a visit in 1 week(s).   Specialty: Family Medicine Contact information: [address] [facility] [place] [address] [phone]                Allergies  Allergen Reactions   Doxycycline  Other (See Comments)    Thrush   Penicillins Hives, Palpitations and Rash   Azithromycin  Other (See Comments)    Thrush   Levofloxacin  Other (See Comments)    Joint pain/muscle pain   Sulfa Antibiotics Rash    Consultations: None   Procedures/Studies: DG CHEST PORT 1 VIEW Result Date: 03/08/2024 CLINICAL DATA:  Shortness of breath EXAM: PORTABLE CHEST 1 VIEW COMPARISON:  03/07/2024 FINDINGS: Cardiac shadow is stable. Small left effusion is  again identified and stable. Right lung remains clear. No bony abnormality is noted. Postsurgical changes in the cervical spine are seen. IMPRESSION: Persistent left effusion and basilar atelectasis. Electronically Signed   By: Violeta Grey M.D.   On: 03/08/2024 02:57   DG CHEST PORT 1 VIEW Result Date: 03/07/2024 CLINICAL DATA:  Shortness of breath. EXAM: PORTABLE CHEST 1 VIEW COMPARISON:  Chest x-ray April 15, 25. FINDINGS: Similar left basilar opacities and small left pleural effusion. Right lung is clear. No visible pneumothorax. No acute bony abnormality. Partially imaged ACDF. IMPRESSION: Similar left basilar opacities and small left pleural effusion. Electronically Signed   By: Stevenson Elbe M.D.   On: 03/07/2024 19:32   DG Chest 2 View Result Date: 03/06/2024 CLINICAL DATA:  Shortness of breath, cough, wheezing EXAM: CHEST - 2 VIEW COMPARISON:  03/01/2024 FINDINGS: Patchy airspace disease in the left lower lobe concerning for pneumonia. No confluent opacity on the  right. Small left pleural effusions suspected. Heart and mediastinal contours within limits. IMPRESSION: Left lower lobe airspace disease with probable small left effusion. Findings concerning for pneumonia. Electronically Signed   By: Janeece Mechanic M.D.   On: 03/06/2024 01:55   DG Chest 2 View Result Date: 03/01/2024 CLINICAL DATA:  73 year old female with a history of acute viral bronchitis EXAM: CHEST - 2 VIEW COMPARISON:  09/15/2021, 10/29/2020 FINDINGS: Cardiomediastinal silhouette unchanged in size and contour. No evidence of central vascular congestion. No interlobular septal thickening. No pneumothorax or pleural effusion. Coarsened interstitial markings, with no confluent airspace disease. No acute displaced fracture. Degenerative changes of the spine. Surgical changes of the cervical region incompletely imaged IMPRESSION: Negative for acute cardiopulmonary disease Electronically Signed   By: Myrlene Asper D.O.   On: 03/01/2024 15:55     Discharge Exam: Vitals:   03/10/24 1323 03/10/24 1333  BP:  117/68  Pulse:  74  Resp:    Temp:  98 F (36.7 C)  SpO2: 95% 98%   Vitals:   03/10/24 0757 03/10/24 0814 03/10/24 1323 03/10/24 1333  BP:    117/68  Pulse:    74  Resp:      Temp:    98 F (36.7 C)  TempSrc:    Oral  SpO2: 97% 97% 95% 98%  Weight:      Height:        General: Pt is alert, awake, not in acute distress Cardiovascular: RRR, S1/S2 +, no rubs, no gallops Respiratory: CTA bilaterally, no wheezing, no rhonchi Abdominal: Soft, NT, ND, bowel sounds + Extremities: no edema, no cyanosis    The results of significant diagnostics from this hospitalization (including imaging, microbiology, ancillary and laboratory) are listed below for reference.     Microbiology: Recent Results (from the past 240 hours)  Resp panel by RT-PCR (RSV, Flu A&B, Covid) Anterior Nasal Swab     Status: None   Collection Time: 03/07/24  5:15 PM   Specimen: Anterior Nasal Swab  Result Value  Ref Range Status   SARS Coronavirus 2 by RT PCR NEGATIVE NEGATIVE Final    Comment: (NOTE) SARS-CoV-2 target nucleic acids are NOT DETECTED.  The SARS-CoV-2 RNA is generally detectable in upper respiratory specimens during the acute phase of infection. The lowest concentration of SARS-CoV-2 viral copies this assay can detect is 138 copies/mL. A negative result does not preclude SARS-Cov-2 infection and should not be used as the sole basis for treatment or other patient management decisions. A negative result may occur with  improper specimen collection/handling,  submission of specimen other than nasopharyngeal swab, presence of viral mutation(s) within the areas targeted by this assay, and inadequate number of viral copies(<138 copies/mL). A negative result must be combined with clinical observations, patient history, and epidemiological information. The expected result is Negative.  Fact Sheet for Patients:  BloggerCourse.com  Fact Sheet for Healthcare Providers:  SeriousBroker.it  This test is no t yet approved or cleared by the [place]  FDA and  has been authorized for detection and/or diagnosis of SARS-CoV-2 by FDA under an Emergency Use Authorization (EUA). This EUA will remain  in effect (meaning this test can be used) for the duration of the COVID-19 declaration under Section 564(b)(1) of the Act, 21 U.S.C.section 360bbb-3(b)(1), unless the authorization is terminated  or revoked sooner.       Influenza A by PCR NEGATIVE NEGATIVE Final   Influenza B by PCR NEGATIVE NEGATIVE Final    Comment: (NOTE) The Xpert Xpress SARS-CoV-2/FLU/RSV plus assay is intended as an aid in the diagnosis of influenza from Nasopharyngeal swab specimens and should not be used as a sole basis for treatment. Nasal washings and aspirates are unacceptable for Xpert Xpress SARS-CoV-2/FLU/RSV testing.  Fact Sheet for  Patients: BloggerCourse.com  Fact Sheet for Healthcare Providers: SeriousBroker.it  This test is not yet approved or cleared by the [place]  FDA and has been authorized for detection and/or diagnosis of SARS-CoV-2 by FDA under an Emergency Use Authorization (EUA). This EUA will remain in effect (meaning this test can be used) for the duration of the COVID-19 declaration under Section 564(b)(1) of the Act, 21 U.S.C. section 360bbb-3(b)(1), unless the authorization is terminated or revoked.     Resp Syncytial Virus by PCR NEGATIVE NEGATIVE Final    Comment: (NOTE) Fact Sheet for Patients: BloggerCourse.com  Fact Sheet for Healthcare Providers: SeriousBroker.it  This test is not yet approved or cleared by the [place]  FDA and has been authorized for detection and/or diagnosis of SARS-CoV-2 by FDA under an Emergency Use Authorization (EUA). This EUA will remain in effect (meaning this test can be used) for the duration of the COVID-19 declaration under Section 564(b)(1) of the Act, 21 U.S.C. section 360bbb-3(b)(1), unless the authorization is terminated or revoked.  Performed at [facility], [address] [place], [place] [address]   Respiratory (~20 pathogens) panel by PCR     Status: Abnormal   Collection Time: 03/07/24  6:55 PM  Result Value Ref Range Status   Adenovirus NOT DETECTED NOT DETECTED Final   Coronavirus 229E NOT DETECTED NOT DETECTED Final    Comment: (NOTE) The Coronavirus on the Respiratory Panel, DOES NOT test for the novel  Coronavirus (2019 nCoV)    Coronavirus HKU1 NOT DETECTED NOT DETECTED Final   Coronavirus NL63 NOT DETECTED NOT DETECTED Final   Coronavirus OC43 NOT DETECTED NOT DETECTED Final   Metapneumovirus NOT DETECTED NOT DETECTED Final   Rhinovirus / Enterovirus NOT DETECTED NOT DETECTED Final   Influenza A NOT DETECTED NOT DETECTED  Final   Influenza B NOT DETECTED NOT DETECTED Final   Parainfluenza Virus 1 NOT DETECTED NOT DETECTED Final   Parainfluenza Virus 2 NOT DETECTED NOT DETECTED Final   Parainfluenza Virus 3 DETECTED (A) NOT DETECTED Final   Parainfluenza Virus 4 NOT DETECTED NOT DETECTED Final   Respiratory Syncytial Virus NOT DETECTED NOT DETECTED Final   Bordetella pertussis NOT DETECTED NOT DETECTED Final   Bordetella Parapertussis NOT DETECTED NOT DETECTED Final   Chlamydophila pneumoniae NOT DETECTED NOT DETECTED Final  Mycoplasma pneumoniae NOT DETECTED NOT DETECTED Final    Comment: Performed at [facility], [address], [place], [place] [address]     Labs: BNP (last 3 results) Recent Labs    03/06/24 0051  BNP 397.0*   Basic Metabolic Panel: Recent Labs  Lab 03/06/24 0051 03/07/24 0413 03/08/24 0348 03/09/24 0509 03/10/24 0439  NA 139 136 137 135 139  K 3.9 4.1 3.9 3.6 4.4  CL 99 99 95* 96* 98  CO2 26 29 28 29 27   GLUCOSE 163* 202* 209* 223* 195*  BUN 32* 28* 33* 32* 33*  CREATININE 1.47* 1.23* 1.13* 1.11* 1.09*  CALCIUM  9.2 8.8* 8.8* 8.4* 8.5*  MG  --   --  2.2 2.2 2.3  PHOS  --   --  3.5  --   --    Liver Function Tests: Recent Labs  Lab 03/07/24 0413 03/08/24 0348  AST 15 18  ALT 21 26  ALKPHOS 94 96  BILITOT 0.6 0.4  PROT 6.3* 6.3*  ALBUMIN 2.7* 2.5*   No results for input(s): "LIPASE", "AMYLASE" in the last 168 hours. No results for input(s): "AMMONIA" in the last 168 hours. CBC: Recent Labs  Lab 03/06/24 0051 03/07/24 0413 03/08/24 0348 03/09/24 0509 03/10/24 0439  WBC 23.6* 24.1* 19.4* 14.4* 16.6*  NEUTROABS  --   --  18.0*  --   --   HGB 12.8 11.9* 11.5* 11.7* 11.2*  HCT 39.1 36.2 34.8* 36.6 35.2*  MCV 90.5 90.3 89.0 89.3 89.6  PLT 374 364 381 376 369   Cardiac Enzymes: No results for input(s): "CKTOTAL", "CKMB", "CKMBINDEX", "TROPONINI" in the last 168 hours. BNP: Invalid input(s): "POCBNP" CBG: Recent Labs  Lab 03/09/24 1130  03/09/24 1709 03/09/24 2117 03/10/24 0715 03/10/24 1104  GLUCAP 167* 366* 256* 169* 224*   D-Dimer No results for input(s): "DDIMER" in the last 72 hours. Hgb A1c Recent Labs    03/08/24 0348  HGBA1C 6.4*   Lipid Profile No results for input(s): "CHOL", "HDL", "LDLCALC", "TRIG", "CHOLHDL", "LDLDIRECT" in the last 72 hours. Thyroid function studies No results for input(s): "TSH", "T4TOTAL", "T3FREE", "THYROIDAB" in the last 72 hours.  Invalid input(s): "FREET3" Anemia work up No results for input(s): "VITAMINB12", "FOLATE", "FERRITIN", "TIBC", "IRON", "RETICCTPCT" in the last 72 hours. Urinalysis    Component Value Date/Time   COLORURINE STRAW (A) 10/29/2020 2109   APPEARANCEUR CLEAR (A) 10/29/2020 2109   LABSPEC 1.006 10/29/2020 2109   PHURINE 5.0 10/29/2020 2109   GLUCOSEU NEGATIVE 10/29/2020 2109   HGBUR NEGATIVE 10/29/2020 2109   BILIRUBINUR NEGATIVE 10/29/2020 2109   KETONESUR NEGATIVE 10/29/2020 2109   PROTEINUR NEGATIVE 10/29/2020 2109   NITRITE NEGATIVE 10/29/2020 2109   LEUKOCYTESUR NEGATIVE 10/29/2020 2109   Sepsis Labs Recent Labs  Lab 03/07/24 0413 03/08/24 0348 03/09/24 0509 03/10/24 0439  WBC 24.1* 19.4* 14.4* 16.6*   Microbiology Recent Results (from the past 240 hours)  Resp panel by RT-PCR (RSV, Flu A&B, Covid) Anterior Nasal Swab     Status: None   Collection Time: 03/07/24  5:15 PM   Specimen: Anterior Nasal Swab  Result Value Ref Range Status   SARS Coronavirus 2 by RT PCR NEGATIVE NEGATIVE Final    Comment: (NOTE) SARS-CoV-2 target nucleic acids are NOT DETECTED.  The SARS-CoV-2 RNA is generally detectable in upper respiratory specimens during the acute phase of infection. The lowest concentration of SARS-CoV-2 viral copies this assay can detect is 138 copies/mL. A negative result does not preclude  SARS-Cov-2 infection and should not be used as the sole basis for treatment or other patient management decisions. A negative result may  occur with  improper specimen collection/handling, submission of specimen other than nasopharyngeal swab, presence of viral mutation(s) within the areas targeted by this assay, and inadequate number of viral copies(<138 copies/mL). A negative result must be combined with clinical observations, patient history, and epidemiological information. The expected result is Negative.  Fact Sheet for Patients:  BloggerCourse.com  Fact Sheet for Healthcare Providers:  SeriousBroker.it  This test is no t yet approved or cleared by the [place]  FDA and  has been authorized for detection and/or diagnosis of SARS-CoV-2 by FDA under an Emergency Use Authorization (EUA). This EUA will remain  in effect (meaning this test can be used) for the duration of the COVID-19 declaration under Section 564(b)(1) of the Act, 21 U.S.C.section 360bbb-3(b)(1), unless the authorization is terminated  or revoked sooner.       Influenza A by PCR NEGATIVE NEGATIVE Final   Influenza B by PCR NEGATIVE NEGATIVE Final    Comment: (NOTE) The Xpert Xpress SARS-CoV-2/FLU/RSV plus assay is intended as an aid in the diagnosis of influenza from Nasopharyngeal swab specimens and should not be used as a sole basis for treatment. Nasal washings and aspirates are unacceptable for Xpert Xpress SARS-CoV-2/FLU/RSV testing.  Fact Sheet for Patients: BloggerCourse.com  Fact Sheet for Healthcare Providers: SeriousBroker.it  This test is not yet approved or cleared by the [place]  FDA and has been authorized for detection and/or diagnosis of SARS-CoV-2 by FDA under an Emergency Use Authorization (EUA). This EUA will remain in effect (meaning this test can be used) for the duration of the COVID-19 declaration under Section 564(b)(1) of the Act, 21 U.S.C. section 360bbb-3(b)(1), unless the authorization is terminated  or revoked.     Resp Syncytial Virus by PCR NEGATIVE NEGATIVE Final    Comment: (NOTE) Fact Sheet for Patients: BloggerCourse.com  Fact Sheet for Healthcare Providers: SeriousBroker.it  This test is not yet approved or cleared by the [place]  FDA and has been authorized for detection and/or diagnosis of SARS-CoV-2 by FDA under an Emergency Use Authorization (EUA). This EUA will remain in effect (meaning this test can be used) for the duration of the COVID-19 declaration under Section 564(b)(1) of the Act, 21 U.S.C. section 360bbb-3(b)(1), unless the authorization is terminated or revoked.  Performed at [facility], [address], [place], [place] [address]   Respiratory (~20 pathogens) panel by PCR     Status: Abnormal   Collection Time: 03/07/24  6:55 PM  Result Value Ref Range Status   Adenovirus NOT DETECTED NOT DETECTED Final   Coronavirus 229E NOT DETECTED NOT DETECTED Final    Comment: (NOTE) The Coronavirus on the Respiratory Panel, DOES NOT test for the novel  Coronavirus (2019 nCoV)    Coronavirus HKU1 NOT DETECTED NOT DETECTED Final   Coronavirus NL63 NOT DETECTED NOT DETECTED Final   Coronavirus OC43 NOT DETECTED NOT DETECTED Final   Metapneumovirus NOT DETECTED NOT DETECTED Final   Rhinovirus / Enterovirus NOT DETECTED NOT DETECTED Final   Influenza A NOT DETECTED NOT DETECTED Final   Influenza B NOT DETECTED NOT DETECTED Final   Parainfluenza Virus 1 NOT DETECTED NOT DETECTED Final   Parainfluenza Virus 2 NOT DETECTED NOT DETECTED Final   Parainfluenza Virus 3 DETECTED (A) NOT DETECTED Final   Parainfluenza Virus 4 NOT DETECTED NOT DETECTED Final   Respiratory Syncytial Virus NOT DETECTED NOT DETECTED Final  Bordetella pertussis NOT DETECTED NOT DETECTED Final   Bordetella Parapertussis NOT DETECTED NOT DETECTED Final   Chlamydophila pneumoniae NOT DETECTED NOT DETECTED Final   Mycoplasma pneumoniae NOT  DETECTED NOT DETECTED Final    Comment: Performed at [facility], [address], [place], [place] [address]     Time coordinating discharge: 35 minutes  SIGNED:   Cornelius Dill, DO [facility] 03/10/2024, 2:36 PM  If 7PM-7AM, please contact night-coverage [email or web address]

## 2024-03-15 ENCOUNTER — Ambulatory Visit (HOSPITAL_BASED_OUTPATIENT_CLINIC_OR_DEPARTMENT_OTHER): Admitting: Pulmonary Disease

## 2024-03-15 ENCOUNTER — Encounter (HOSPITAL_BASED_OUTPATIENT_CLINIC_OR_DEPARTMENT_OTHER): Payer: Self-pay | Admitting: Pulmonary Disease

## 2024-03-15 VITALS — BP 120/82 | HR 84 | Ht 66.0 in | Wt 155.0 lb

## 2024-03-15 DIAGNOSIS — J218 Acute bronchiolitis due to other specified organisms: Secondary | ICD-10-CM | POA: Diagnosis not present

## 2024-03-15 DIAGNOSIS — Z9981 Dependence on supplemental oxygen: Secondary | ICD-10-CM

## 2024-03-15 DIAGNOSIS — J9611 Chronic respiratory failure with hypoxia: Secondary | ICD-10-CM | POA: Diagnosis not present

## 2024-03-15 DIAGNOSIS — B9789 Other viral agents as the cause of diseases classified elsewhere: Secondary | ICD-10-CM

## 2024-03-15 DIAGNOSIS — Z7722 Contact with and (suspected) exposure to environmental tobacco smoke (acute) (chronic): Secondary | ICD-10-CM

## 2024-03-15 DIAGNOSIS — J4541 Moderate persistent asthma with (acute) exacerbation: Secondary | ICD-10-CM

## 2024-03-15 MED ORDER — IPRATROPIUM-ALBUTEROL 0.5-2.5 (3) MG/3ML IN SOLN: 3.0000 mL | Freq: Four times a day (QID) | RESPIRATORY_TRACT | 2 refills | Status: AC | PRN

## 2024-03-15 MED ORDER — PREDNISONE 10 MG PO TABS: ORAL_TABLET | ORAL | 0 refills | Status: AC

## 2024-03-15 MED ORDER — NYSTATIN 100000 UNIT/ML MT SUSP: 5.0000 mL | Freq: Four times a day (QID) | OROMUCOSAL | 0 refills | Status: AC

## 2024-03-15 NOTE — Progress Notes (Signed)
 Subjective:   PATIENT ID: Misty Briggs GENDER: female DOB: [birth year], MRN: [id number]   HPI  Chief Complaint  Patient presents with   Follow-up    Asthma    Reason for Visit: Follow-up  [name] is 73 year old female never smoker with asthma-COPD, rheumatoid arthritis and hx covid pneumonia 09/2019 who presents for follow-up.  Synopsis: She was previously seen in [facility] in [place] by Dr. Viva Grise. Established care at [facility] in [place] in 2021. Followed by Allergy in [place] for weekly shots. Fall is usually her worse season with some hoarseness with her allergies. Compliant with oxygen nightly.   02/02/24 Since our last visit she was previously well controlled on Breztri  and nocturnal O2. Recently went to a baby shower. She reports worsening leg swelling two weeks ago. Began having shortness of breath, productive cough, nasal congestion and low grade fevers. Son-in-law with covid. She usually gets sick at this time of the year (fall/early spring). Compliant with O2 nightly and considering switching DME providers.  03/15/24 Since our last visit she was seen by Dr. Villa Greaser for acute visit on 03/01/24 for fever and cough so sent to the ED. Admitted and +parainfluenza. Treated with steroids, ceftriaxone /azithromycin , and inhalers and discharged home with oxygen 2L. She completed antibiotic yesterday but continues to have shortness of breath and unchanged oxygen requirement. Also feels like she has left chest pain tenderness. Worsened with cough. Denies wheezing.  Prior inhalers: Does not tolerate powder forms  Social History: Husband has COPD   Past Medical History:  Diagnosis Date   Arthritis    Asthma    COPD (chronic obstructive pulmonary disease) (HCC)    Dr. Jamal Mays, [facility] [place] pulmonologist   Diabetes mellitus    Fever of unknown origin 02/28/2020   GERD (gastroesophageal reflux disease)    Heart murmur    History of  COVID-19 11/03/2020   HLD (hyperlipidemia)    Hypertension    Myocardial infarction (HCC)    mild   Pneumonia due to COVID-19 virus 09/26/2019   PONV (postoperative nausea and vomiting)    itching sometimes   Rheumatoid arthritis(714.0)    Shortness of breath      Allergies  Allergen Reactions   Doxycycline  Other (See Comments)    Thrush   Penicillins Hives, Palpitations and Rash   Azithromycin  Other (See Comments)    Thrush   Levofloxacin  Other (See Comments)    Joint pain/muscle pain   Sulfa Antibiotics Rash     Outpatient Medications Prior to Visit  Medication Sig Dispense Refill   albuterol  (PROVENTIL ) (2.5 MG/3ML) 0.083% nebulizer solution Twice daily for 3-5 days until better 75 mL 0   albuterol  (VENTOLIN  HFA) 108 (90 Base) MCG/ACT inhaler Inhale 1-2 puffs into the lungs every 6 (six) hours as needed for wheezing or shortness of breath. 8 g 1   Ascorbic Acid  (VITAMIN C ) 1000 MG tablet Take 1,000 mg by mouth daily.     aspirin  EC 81 MG tablet Take by mouth daily.      Budeson-Glycopyrrol-Formoterol (BREZTRI  AEROSPHERE) 160-9-4.8 MCG/ACT AERO Inhale 2 puffs into the lungs in the morning and at bedtime. 32.1 g 1   calcium  carbonate (OS-CAL - DOSED IN MG OF ELEMENTAL CALCIUM ) 1250 (500 Ca) MG tablet Take 2 tablets by mouth daily with breakfast.     Coenzyme Q10 (CO Q-10 PO) Take by mouth daily.     EPINEPHrine 0.3 mg/0.3 mL IJ SOAJ injection Inject 0.3 mLs into the  muscle once as needed for anaphylaxis.     fluticasone  (FLONASE ) 50 MCG/ACT nasal spray Place 1 spray into both nostrils daily as needed for allergies or rhinitis.     furosemide  (LASIX ) 20 MG tablet Take 20 mg by mouth daily.      gabapentin  (NEURONTIN ) 100 MG capsule Take 3 capsules by mouth at bedtime.     guaiFENesin -dextromethorphan  (ROBITUSSIN DM) 100-10 MG/5ML syrup Take 5 mLs by mouth every 4 (four) hours as needed for cough (chest congestion). 118 mL 0   hydroxychloroquine  (PLAQUENIL ) 200 MG tablet Monday  -Friday take 2 tablets Saturday and Sunday take 1 tablet     hydrOXYzine  (ATARAX /VISTARIL ) 25 MG tablet Take 25 mg by mouth every evening.     levocetirizine (XYZAL ) 5 MG tablet Take 5 mg by mouth every evening.     lidocaine  (LIDODERM ) 5 % Place 1 patch onto the skin daily. Remove & Discard patch within 12 hours or as directed by MD 30 patch 0   lovastatin (MEVACOR) 20 MG tablet Take 20 mg by mouth at bedtime.     metoprolol  tartrate (LOPRESSOR ) 50 MG tablet Take 50 mg by mouth 2 (two) times daily.     montelukast  (SINGULAIR ) 10 MG tablet 1 tablet in the evening     Multiple Vitamin (MULTIVITAMIN) capsule Take 1 capsule by mouth daily.     omeprazole (PRILOSEC) 20 MG capsule Take 20 mg by mouth daily.     potassium chloride  (KLOR-CON ) 10 MEQ tablet Take 10 mEq by mouth daily.     predniSONE  (DELTASONE ) 10 MG tablet Take 4 tablets (40 mg total) by mouth daily for 5 days. 20 tablet 0   vitamin B-12 (CYANOCOBALAMIN ) 1000 MCG tablet Take 1,000 mcg by mouth daily.     amLODipine  (NORVASC ) 5 MG tablet Take 1 tablet (5 mg total) by mouth daily. 90 tablet 0   ferrous sulfate  325 (65 FE) MG tablet Take 1 tablet (325 mg total) by mouth 2 (two) times daily with a meal. 60 tablet 0   metFORMIN  (GLUCOPHAGE ) 1000 MG tablet Take 1 tablet (1,000 mg total) by mouth 2 (two) times daily with a meal. (Patient taking differently: Take 500 mg by mouth 2 (two) times daily with a meal.) 60 tablet 0   No facility-administered medications prior to visit.    Review of Systems  Constitutional:  Negative for chills, diaphoresis, fever, malaise/fatigue and weight loss.  HENT:  Negative for congestion.   Respiratory:  Positive for cough and shortness of breath. Negative for hemoptysis, sputum production and wheezing.   Cardiovascular:  Negative for chest pain (chest tendernnes), palpitations and leg swelling.    Objective:   Vitals:   03/15/24 1120  BP: 120/82  Pulse: 84  SpO2: 93%  Weight: 155 lb (70.3 kg)   Height: 5\' 6"  (1.676 m)     SpO2: 93 % Physical Exam: General: Well-appearing, no acute distress HENT: [place], AT Eyes: EOMI, no scleral icterus Respiratory: Diminished left base air entry Cardiovascular: RRR, -M/R/G, no JVD Extremities:-Edema,-tenderness Neuro: AAO x4, CNII-XII grossly intact Psych: Normal mood, normal affect  Data Reviewed:  Imaging: CT Chest 03/02/20 - Subsegemental atelectasis. Bilateral pleural effusions. Unchanged ground glass opacities.  CXR 10/29/20 - Chronic interstitial prominance CXR 09/15/21 - Chronic bronchitis changed. No infiltrate, effusion or edema CXR 03/01/24 - No acute infiltrate effusion or edema CXR 03/06/24 - LLL infiltrate with small left effusion CXR 03/07/24 - Unchanged LLL opacity and left effusion CXR 03/08/24 - Persistent left pleural effusion  with atelectasis  PFT: 12/02/20 FVC 2.56 (77%) FEV1 2.12 (84%) Ratio 82  TLC 80% DLCO 73% Interpretation: No obstructive or restrictive defect is present. No significant bronchodilator response. Isolated reduced DLCO may be suggestive of early ILD or PVD   Ambulatory O2 03/15/24  DME: [facility]   POC SATURATION QUALIFICATIONS: (This note is used to comply with regulatory documentation for home oxygen)   Patient Saturations on Room Air at Rest =93 %   Patient Saturations on Room Air while Ambulating = 87%   Patient Saturations on 4 Liters of continuous oxygen while Ambulating = 95%   Please briefly explain why patient needs home oxygen: to maintain sat above 90%  Assessment & Plan:   Discussion: 73 year old female with asthma-COPD, nocturnal hypoxemia, allergic rhinitis, GERD, RA and HLD who presents for follow-up. Recent hospital course reviewed with +parainfluenza pneumonia with left pleural effusion. Completed antibiotic course and steroids. Performed in-clinic ultrasound of left thorax with no evidence of effusion, only atelectatic lung. Discussed plan as noted below:  Parainfluenza  pneumonia with atelectasis  Pleuritic chest pain secondary to above  --No effusion on ultrasound --Supportive care with prednisone  ordered below --Incentive spirometer 5-10 x daily --Wear oxygen for goal >90%  Asthma-COPD, persistent exacerbation secondary to parainfluenza --Prednisone  taper --CONTINUE Breztri  TWO puffs TWICE a day.  --CONTINUE Albuterol  as needed for shortness of breath or wheezing. REFILLED --Encourage regular aerobic activity as tolerated  Chronic hypoxemic respiratory failure --DME: Requesting to change to [facility] office to [place] office. Ph [phone] and [phone] --Ambulatory O2 with desaturations. Qualified for 2L with activity and sleep --Already has equipment but wants to switch location of DME service   Allergy --Weekly allergy shots. Epi pen PRN  Rheumatoid Arthritis Previously on methotrexate  --Plaquenil   Osteoporosis --Recommend minimizing steroid use if able  Health Maintenance Immunization History  Administered Date(s) Administered   Fluad Quad(high Dose 65+) 07/20/2019, 08/19/2020   Influenza, High Dose Seasonal PF 12/16/2016, 08/30/2017, 09/11/2019, 02/07/2020, 02/12/2021, 02/18/2022   Influenza,inj,Quad PF,6-35 Mos 08/13/2022   Influenza-Unspecified 08/22/2020, 08/06/2021   Moderna Sars-Covid-2 Vaccination 06/18/2020, 07/14/2020, 02/12/2021   Pneumococcal Conjugate-13 01/23/2019   Pneumococcal Polysaccharide-23 12/16/2016, 08/30/2017, 08/24/2018, 09/11/2019, 02/07/2020, 09/25/2020, 02/12/2021, 02/18/2022   Zoster Recombinant(Shingrix) 04/13/2019, 06/27/2019   CT Lung Screen - not qualified  No orders of the defined types were placed in this encounter.  Meds ordered this encounter  Medications   predniSONE  (DELTASONE ) 10 MG tablet    Sig: Take 4 tablets (40 mg total) by mouth daily with breakfast for 2 days, THEN 3 tablets (30 mg total) daily with breakfast for 2 days, THEN 2 tablets (20 mg total) daily with breakfast  for 2 days, THEN 1 tablet (10 mg total) daily with breakfast for 2 days.    Dispense:  20 tablet    Refill:  0   ipratropium-albuterol  (DUONEB) 0.5-2.5 (3) MG/3ML SOLN    Sig: Take 3 mLs by nebulization every 6 (six) hours as needed.    Dispense:  360 mL    Refill:  2   nystatin  (MYCOSTATIN ) 100000 UNIT/ML suspension    Sig: Take 5 mLs (500,000 Units total) by mouth 4 (four) times daily.    Dispense:  150 mL    Refill:  0   Return in about 1 month (around 04/14/2024) for with Dr. Alva or NP.  I have spent a total time of 45-minutes on the day of the appointment including chart review, data review, collecting history, coordinating care and discussing medical  diagnosis and plan with the patient/family. Past medical history, allergies, medications were reviewed. Pertinent imaging, labs and tests included in this note have been reviewed and interpreted independently by me.  Woodrow Drab Genetta Kenning, MD [facility]

## 2024-03-15 NOTE — Addendum Note (Signed)
 Addended by: Kary Pages on: 03/15/2024 04:17 PM   Modules accepted: Orders

## 2024-03-15 NOTE — Patient Instructions (Signed)
 Parainfluenza pneumonia with atelectasis  --No effusion on ultrasound --Supportive care with prednisone  ordered below --Incentive spirometer 5-10 x daily --Wear oxygen for goal >90%  Asthma-COPD, currently in exacerbation --Prednisone  taper --Cefpodoxime  200 mg BID x 5 days (multiple allergies) --CONTINUE Breztri  TWO puffs TWICE a day.  --CONTINUE Albuterol  as needed for shortness of breath or wheezing. REFILLED --Encourage regular aerobic activity as tolerated  Chronic hypoxemic respiratory failure --DME: Requesting to change to [facility] office to [place] office. Ph [phone] and [phone] --Ambulatory O2 with desaturations. Qualified for 2L with activity and sleep --Already has equipment but wants to switch location of DME service

## 2024-04-04 ENCOUNTER — Ambulatory Visit (HOSPITAL_BASED_OUTPATIENT_CLINIC_OR_DEPARTMENT_OTHER): Payer: Self-pay | Admitting: Pulmonary Disease

## 2024-04-04 ENCOUNTER — Telehealth (HOSPITAL_BASED_OUTPATIENT_CLINIC_OR_DEPARTMENT_OTHER): Payer: Self-pay | Admitting: Pulmonary Disease

## 2024-04-04 ENCOUNTER — Encounter (HOSPITAL_BASED_OUTPATIENT_CLINIC_OR_DEPARTMENT_OTHER): Payer: Self-pay

## 2024-04-04 NOTE — Telephone Encounter (Signed)
 Copied from CRM [id number]. Topic: Clinical - Lab/Test Results >> Apr 04, 2024  4:04 PM Alverda Joe S wrote: Reason for CRM: patient calling to ask why do she need to go to [facility] or ed for a chest xray when she want to come to [facility] please call patient or message her through mychart

## 2024-04-04 NOTE — Telephone Encounter (Signed)
**Note De-identified  [place] Obfuscation** Please advise 

## 2024-04-04 NOTE — Telephone Encounter (Signed)
 Duplicate I have been talking to pt in mychart

## 2024-04-04 NOTE — Telephone Encounter (Signed)
   Copied From CRM [id number]. Reason for Triage: Patient states she's been having a fever since Saturday - states today its a low grade fever. Patient states last time she saw Dr.Ellison she had pneumonia and the flu - patient is worried she might have an infection.  Pt calling in again because not heard back  TRIAGE SUMMARY NOTE: Pt calling in again because did not hear back from calling earlier, pt also messaged the office. Pt reporting that she has temp of 101.44F on Sunday with 100.1 F today, confirms no more SOB than normal, but "still tight in chest" which pt states Dr. Washington Hacker told her "would take a while" to resolve. Pt reporting she is also coughing "some" but not coughing anything up yet, pt reporting that her symptoms have worsened over the past week. Advised per message chain for patient message today 5/14, per [place] NP right before this pt call, informed pt that office requesting pt take a covid test and call back if positive. Advised call back after results and can take next steps from there. Pt verbalized understanding and intent to do so.  E2C2 Pulmonary Triage - Initial Assessment Questions "Chief Complaint (e.g., cough, sob, wheezing, fever, chills, sweat or additional symptoms) *Go to specific symptom protocol after initial questions. Temp 101.4 F on Sunday 100.1 today 97 is my usual No more SOB than normal Still tight in chest, said would take while for it to resolve Coughing some but not coughing anything up yet Since been home got worse in the last week Can't take a test to see but know I don't have it, have had it 3x before  Has covid tests at home and will take one  Reason for Disposition  RN needs further essential information from caller in order to complete triage  Answer Assessment - Initial Assessment Questions 1. REASON FOR CALL or QUESTION: "What is your reason for calling today?" or "How can I best help you?" or "What question do you have that I can help  answer?"     Pt calling in to follow up on not hearing back from office. Advised pt per message from provider for pt to take covid test then call back  Protocols used: Information Only Call - No Triage-A-AH

## 2024-04-04 NOTE — Telephone Encounter (Signed)
 Needs to COVID test and call back if positive.

## 2024-04-04 NOTE — Telephone Encounter (Signed)
 Pt notified on AM

## 2024-04-04 NOTE — Telephone Encounter (Signed)
 Left pt message on VM that she needs an evaluation and not just a chest xray. She can try to call her PCP but may not be able to get in this late as we have no availability in clinic. So she should proceed to UC or ER.

## 2024-04-04 NOTE — Telephone Encounter (Signed)
 She had recent pneumonia. Given new onset fever with negative COVID test (although possible to still be viral), she needs to go to UC or the ED to have a chest x ray done for further evaluation. Thanks.

## 2024-04-19 ENCOUNTER — Ambulatory Visit (HOSPITAL_BASED_OUTPATIENT_CLINIC_OR_DEPARTMENT_OTHER): Admitting: Nurse Practitioner

## 2024-04-30 ENCOUNTER — Other Ambulatory Visit: Payer: Self-pay | Admitting: Nurse Practitioner

## 2024-04-30 ENCOUNTER — Encounter (HOSPITAL_BASED_OUTPATIENT_CLINIC_OR_DEPARTMENT_OTHER): Payer: Self-pay | Admitting: Nurse Practitioner

## 2024-04-30 ENCOUNTER — Ambulatory Visit (HOSPITAL_BASED_OUTPATIENT_CLINIC_OR_DEPARTMENT_OTHER): Admitting: Nurse Practitioner

## 2024-04-30 ENCOUNTER — Ambulatory Visit (HOSPITAL_BASED_OUTPATIENT_CLINIC_OR_DEPARTMENT_OTHER)

## 2024-04-30 VITALS — BP 111/51 | HR 73 | Ht 66.0 in | Wt 157.1 lb

## 2024-04-30 DIAGNOSIS — G4734 Idiopathic sleep related nonobstructive alveolar hypoventilation: Secondary | ICD-10-CM

## 2024-04-30 DIAGNOSIS — J189 Pneumonia, unspecified organism: Secondary | ICD-10-CM | POA: Diagnosis not present

## 2024-04-30 DIAGNOSIS — R600 Localized edema: Secondary | ICD-10-CM | POA: Diagnosis not present

## 2024-04-30 DIAGNOSIS — J9611 Chronic respiratory failure with hypoxia: Secondary | ICD-10-CM

## 2024-04-30 DIAGNOSIS — J4489 Other specified chronic obstructive pulmonary disease: Secondary | ICD-10-CM

## 2024-04-30 DIAGNOSIS — J159 Unspecified bacterial pneumonia: Secondary | ICD-10-CM

## 2024-04-30 NOTE — Assessment & Plan Note (Signed)
 Superimposed in setting of parainfluenza virus. Clinically improved. Repeat CXR today. Prior US  with resolution of effusion.

## 2024-04-30 NOTE — Assessment & Plan Note (Signed)
 See above. Continue O2 at night. Goal >88-90%

## 2024-04-30 NOTE — Assessment & Plan Note (Signed)
 Compensated on current regimen. Clinically improved. Action plan in place. Encouraged to work on graded exercises. Walk test without exertional hypoxia.   Patient Instructions  Continue Albuterol  inhaler 2 puffs or 3 mL neb every 6 hours as needed for shortness of breath or wheezing. Notify if symptoms persist despite rescue inhaler/neb use.  Continue Breztri  2 puffs Twice daily. Brush tongue and rinse mouth afterwards Continue flonase  nasal spray 2 sprays each nostril daily Continue xyzal  daily Continue allergy shots with your allergist Continue singulair  daily Continue supplemental oxygen at night 2 lpm   Walk test today looked great. No low oxygen levels   Repeat chest x ray today Labs today  Let your primary care doctor know that the swelling in your legs is getting worse, not better. Wear compression stockings and elevate your legs when sitting  Follow up in 6 months with Dr. Washington Hacker. If symptoms do not improve or worsen, please contact office for sooner follow up or seek emergency care.

## 2024-04-30 NOTE — Patient Instructions (Addendum)
 Continue Albuterol  inhaler 2 puffs or 3 mL neb every 6 hours as needed for shortness of breath or wheezing. Notify if symptoms persist despite rescue inhaler/neb use.  Continue Breztri  2 puffs Twice daily. Brush tongue and rinse mouth afterwards Continue flonase  nasal spray 2 sprays each nostril daily Continue xyzal  daily Continue allergy shots with your allergist Continue singulair  daily Continue supplemental oxygen at night 2 lpm   Walk test today looked great. No low oxygen levels   Repeat chest x ray today Labs today  Let your primary care doctor know that the swelling in your legs is getting worse, not better. Wear compression stockings and elevate your legs when sitting  Follow up in 6 months with Dr. Washington Briggs. If symptoms do not improve or worsen, please contact office for sooner follow up or seek emergency care.

## 2024-04-30 NOTE — Progress Notes (Signed)
 @Patient  ID: Misty Briggs, female    DOB: [birth year], 73 y.o.   MRN: [id number]  Chief Complaint  Patient presents with   Follow-up    Asthma, COPD    Referring provider: Lyle San, MD  HPI: 73 year old female, never smoker followed for asthma-COPD and nocturnal hypoxemia. She is patient of Dr. Jacqualyn Mates and last seen in office 03/15/2024. Past medical history significant for RA on plaquenil , HTN, GERD, HLD, allergic rhinitis on allergy shots.   TEST/EVENTS:  12/02/2020 PFT: FVC 75, FEV1 81, ratio 83, TLC 80, DLCOcor 78 03/08/2024 CXR: persistent left effusion and basilar atelectasis   03/15/2024: OV with Dr. Washington Hacker. Saw Dr. Alva for acute visit 03/01/2024. Sent to ED. Admitted; positive for parainfluenza. Treated with steroids, ceftriaxone /azithromycin . D/c on 2 lpm supplemental O2. Completed abx yesterday. Continues to have SOB and unchanged O2 requirement. Also has left chest tenderness. Worsened with cough. In clinic US ; no evidence of effusion. Supportive care with IS and O2. Prednisone  taper.   04/30/2024: Today - follow up Patient presents today for follow up. Doing much better since her last visit. Feels back to her baseline. No significant cough, chest congestion, wheezing. Activity tolerance is good. She doesn't have to use her rescue often. Uses Breztri  twice a day. She was able to wean herself off oxygen. Hasn't noticed any levels <97% at home. She does still wear her supplemental O2 2 lpm at night, which is baseline for her.   She is having some increased swelling in her lower legs. She mentioned this to her primary care provider. No changes were made. She feels like it's gotten worse since she saw them at the end of May. This started long before her hospitalization, got worse while she was hospitalized and then got better after discharge. Seems to be worsening again. No issues with her breathing, orthopnea, calf pain, hemoptysis. She does have a family hx of CHF and is  concerned regarding this. She takes lasix  20 mg daily. She does have compression stockings but isn't wearing them right now.   Allergies  Allergen Reactions   Doxycycline  Other (See Comments)    Thrush   Penicillins Hives, Palpitations and Rash   Azithromycin  Other (See Comments)    Thrush   Levofloxacin  Other (See Comments)    Joint pain/muscle pain   Sulfa Antibiotics Rash    Immunization History  Administered Date(s) Administered   Fluad Quad(high Dose 65+) 07/20/2019, 08/19/2020   Influenza, High Dose Seasonal PF 12/16/2016, 08/30/2017, 09/11/2019, 02/07/2020, 02/12/2021, 02/18/2022   Influenza,inj,Quad PF,6-35 Mos 08/13/2022   Influenza-Unspecified 08/22/2020, 08/06/2021   Moderna Sars-Covid-2 Vaccination 06/18/2020, 07/14/2020, 02/12/2021   Pneumococcal Conjugate-13 01/23/2019   Pneumococcal Polysaccharide-23 12/16/2016, 08/30/2017, 08/24/2018, 09/11/2019, 02/07/2020, 09/25/2020, 02/12/2021, 02/18/2022   Zoster Recombinant(Shingrix) 04/13/2019, 06/27/2019    Past Medical History:  Diagnosis Date   Arthritis    Asthma    COPD (chronic obstructive pulmonary disease) (HCC)    Dr. Jamal Mays, [facility] pulmonologist   Diabetes mellitus    Fever of unknown origin 02/28/2020   GERD (gastroesophageal reflux disease)    Heart murmur    History of COVID-19 11/03/2020   HLD (hyperlipidemia)    Hypertension    Myocardial infarction (HCC)    mild   Pneumonia due to COVID-19 virus 09/26/2019   PONV (postoperative nausea and vomiting)    itching sometimes   Rheumatoid arthritis(714.0)    Shortness of breath     Tobacco History: Social History   Tobacco Use  Smoking Status Never   Passive exposure: Yes  Smokeless Tobacco Never   Counseling given: Not Answered   Outpatient Medications Prior to Visit  Medication Sig Dispense Refill   albuterol  (PROVENTIL ) (2.5 MG/3ML) 0.083% nebulizer solution Twice daily for 3-5 days until better 75 mL 0   albuterol   (VENTOLIN  HFA) 108 (90 Base) MCG/ACT inhaler Inhale 1-2 puffs into the lungs every 6 (six) hours as needed for wheezing or shortness of breath. 8 g 1   Ascorbic Acid  (VITAMIN C ) 1000 MG tablet Take 1,000 mg by mouth daily.     aspirin  EC 81 MG tablet Take by mouth daily.      Budeson-Glycopyrrol-Formoterol (BREZTRI  AEROSPHERE) 160-9-4.8 MCG/ACT AERO Inhale 2 puffs into the lungs in the morning and at bedtime. 32.1 g 1   calcium  carbonate (OS-CAL - DOSED IN MG OF ELEMENTAL CALCIUM ) 1250 (500 Ca) MG tablet Take 2 tablets by mouth daily with breakfast.     Coenzyme Q10 (CO Q-10 PO) Take by mouth daily.     EPINEPHrine 0.3 mg/0.3 mL IJ SOAJ injection Inject 0.3 mLs into the muscle once as needed for anaphylaxis.     fluticasone  (FLONASE ) 50 MCG/ACT nasal spray Place 1 spray into both nostrils daily as needed for allergies or rhinitis.     furosemide  (LASIX ) 20 MG tablet Take 20 mg by mouth daily.      gabapentin  (NEURONTIN ) 100 MG capsule Take 3 capsules by mouth at bedtime.     guaiFENesin -dextromethorphan  (ROBITUSSIN DM) 100-10 MG/5ML syrup Take 5 mLs by mouth every 4 (four) hours as needed for cough (chest congestion). 118 mL 0   hydroxychloroquine  (PLAQUENIL ) 200 MG tablet Monday -Friday take 2 tablets Saturday and Sunday take 1 tablet     hydrOXYzine  (ATARAX /VISTARIL ) 25 MG tablet Take 25 mg by mouth every evening.     ipratropium-albuterol  (DUONEB) 0.5-2.5 (3) MG/3ML SOLN Take 3 mLs by nebulization every 6 (six) hours as needed. 360 mL 2   levocetirizine (XYZAL ) 5 MG tablet Take 5 mg by mouth every evening.     lidocaine  (LIDODERM ) 5 % Place 1 patch onto the skin daily. Remove & Discard patch within 12 hours or as directed by MD 30 patch 0   lovastatin (MEVACOR) 20 MG tablet Take 20 mg by mouth at bedtime.     metoprolol  tartrate (LOPRESSOR ) 50 MG tablet Take 50 mg by mouth 2 (two) times daily.     montelukast  (SINGULAIR ) 10 MG tablet 1 tablet in the evening     Multiple Vitamin (MULTIVITAMIN)  capsule Take 1 capsule by mouth daily.     nystatin  (MYCOSTATIN ) 100000 UNIT/ML suspension Take 5 mLs (500,000 Units total) by mouth 4 (four) times daily. 150 mL 0   omeprazole (PRILOSEC) 20 MG capsule Take 20 mg by mouth daily.     potassium chloride  (KLOR-CON ) 10 MEQ tablet Take 10 mEq by mouth daily.     vitamin B-12 (CYANOCOBALAMIN ) 1000 MCG tablet Take 1,000 mcg by mouth daily.     amLODipine  (NORVASC ) 5 MG tablet Take 1 tablet (5 mg total) by mouth daily. 90 tablet 0   ferrous sulfate  325 (65 FE) MG tablet Take 1 tablet (325 mg total) by mouth 2 (two) times daily with a meal. 60 tablet 0   metFORMIN  (GLUCOPHAGE ) 1000 MG tablet Take 1 tablet (1,000 mg total) by mouth 2 (two) times daily with a meal. (Patient taking differently: Take 500 mg by mouth 2 (two) times daily with a meal.) 60 tablet 0  No facility-administered medications prior to visit.     Review of Systems:   Constitutional: No weight loss or gain, night sweats, fevers, chills, fatigue, or lassitude. HEENT: No headaches, difficulty swallowing, tooth/dental problems, or sore throat. No sneezing, itching, ear ache, nasal congestion, or post nasal drip CV:  +swelling in lower extremities. No chest pain, orthopnea, PND, anasarca, dizziness, palpitations, syncope Resp: No shortness of breath with exertion or at rest. No excess mucus or change in color of mucus. No productive or non-productive. No hemoptysis. No wheezing.  No chest wall deformity GI:  No heartburn, indigestion GU: No dysuria, change in color of urine, urgency or frequency.  No flank pain, no hematuria  Skin: No rash, lesions, ulcerations MSK:  No joint pain or swelling. Neuro: No dizziness or lightheadedness.  Psych: No depression or anxiety. Mood stable.     Physical Exam:  BP (!) 111/51   Pulse 73   Ht 5\' 6"  (1.676 m)   Wt 157 lb 1.6 oz (71.3 kg)   SpO2 97%   BMI 25.36 kg/m   GEN: Pleasant, interactive, well-appearing; in no acute distress HEENT:   Normocephalic and atraumatic. PERRLA. Sclera white. Nasal turbinates pink, moist and patent bilaterally. No rhinorrhea present. Oropharynx pink and moist, without exudate or edema. No lesions, ulcerations, or postnasal drip.  NECK:  Supple w/ fair ROM. No JVD present. Thyroid symmetrical with no goiter or nodules palpated. No lymphadenopathy.   CV: RRR, no m/r/g, +1 BLE edema. Pulses intact, +2 bilaterally. No cyanosis, pallor or clubbing. PULMONARY:  Unlabored, regular breathing. Clear bilaterally A&P w/o wheezes/rales/rhonchi. No accessory muscle use.  GI: BS present and normoactive. Soft, non-tender to palpation. No organomegaly or masses detected. MSK: No erythema, warmth or tenderness. Cap refil <2 sec all extrem. No deformities or joint swelling noted.  Neuro: A/Ox3. No focal deficits noted.   Skin: Warm, no lesions or rashe Psych: Normal affect and behavior. Judgement and thought content appropriate.     Lab Results:  CBC    Component Value Date/Time   WBC 16.6 (H) 03/10/2024 0439   RBC 3.93 03/10/2024 0439   HGB 11.2 (L) 03/10/2024 0439   HCT 35.2 (L) 03/10/2024 0439   PLT 369 03/10/2024 0439   MCV 89.6 03/10/2024 0439   MCH 28.5 03/10/2024 0439   MCHC 31.8 03/10/2024 0439   RDW 13.2 03/10/2024 0439   LYMPHSABS 0.8 03/08/2024 0348   MONOABS 0.2 03/08/2024 0348   EOSABS 0.0 03/08/2024 0348   BASOSABS 0.0 03/08/2024 0348    BMET    Component Value Date/Time   NA 139 03/10/2024 0439   K 4.4 03/10/2024 0439   CL 98 03/10/2024 0439   CO2 27 03/10/2024 0439   GLUCOSE 195 (H) 03/10/2024 0439   BUN 33 (H) 03/10/2024 0439   CREATININE 1.09 (H) 03/10/2024 0439   CALCIUM  8.5 (L) 03/10/2024 0439   GFRNONAA 54 (L) 03/10/2024 0439   GFRAA >60 03/06/2020 0343    BNP    Component Value Date/Time   BNP 397.0 (H) 03/06/2024 0051     Imaging:  No results found.  Administration History     None          Latest Ref Rng & Units 12/02/2020   11:45 AM  PFT Results   FVC-Pre L 2.51   FVC-Predicted Pre % 75   FVC-Post L 2.56   FVC-Predicted Post % 77   Pre FEV1/FVC % % 82   Post FEV1/FCV % % 83  FEV1-Pre L 2.06   FEV1-Predicted Pre % 81   FEV1-Post L 2.12   DLCO uncorrected ml/min/mmHg 15.43   DLCO UNC% % 73   DLCO corrected ml/min/mmHg 16.48   DLCO COR %Predicted % 78   DLVA Predicted % 103   TLC L 4.30   TLC % Predicted % 80   RV % Predicted % 80     No results found for: "NITRICOXIDE"      Assessment & Plan:   Asthma-COPD overlap syndrome (HCC) Compensated on current regimen. Clinically improved. Action plan in place. Encouraged to work on graded exercises. Walk test without exertional hypoxia.   Patient Instructions  Continue Albuterol  inhaler 2 puffs or 3 mL neb every 6 hours as needed for shortness of breath or wheezing. Notify if symptoms persist despite rescue inhaler/neb use.  Continue Breztri  2 puffs Twice daily. Brush tongue and rinse mouth afterwards Continue flonase  nasal spray 2 sprays each nostril daily Continue xyzal  daily Continue allergy shots with your allergist Continue singulair  daily Continue supplemental oxygen at night 2 lpm   Walk test today looked great. No low oxygen levels   Repeat chest x ray today Labs today  Let your primary care doctor know that the swelling in your legs is getting worse, not better. Wear compression stockings and elevate your legs when sitting  Follow up in 6 months with Dr. Washington Hacker. If symptoms do not improve or worsen, please contact office for sooner follow up or seek emergency care.    Chronic hypoxemic respiratory failure (HCC) See above. Continue O2 at night. Goal >88-90%  Community acquired bacterial pneumonia Superimposed in setting of parainfluenza virus. Clinically improved. Repeat CXR today. Prior US  with resolution of effusion.   Bilateral lower extremity edema +1 BLE edema. Chronic symptoms with waxing and waning. Check BNP/BMET today. Advised to follow up  with PCP.    Advised if symptoms do not improve or worsen, to please contact office for sooner follow up or seek emergency care.   I spent 35 minutes of dedicated to the care of this patient on the date of this encounter to include pre-visit review of records, face-to-face time with the patient discussing conditions above, post visit ordering of testing, clinical documentation with the electronic health record, making appropriate referrals as documented, and communicating necessary findings to members of the patients care team.  Roetta Clarke, NP 04/30/2024  Pt aware and understands NP's role.

## 2024-04-30 NOTE — Assessment & Plan Note (Signed)
+  1 BLE edema. Chronic symptoms with waxing and waning. Check BNP/BMET today. Advised to follow up with PCP.

## 2024-05-01 LAB — BASIC METABOLIC PANEL WITH GFR
BUN/Creatinine Ratio: 12 (ref 12–28)
BUN: 13 mg/dL (ref 8–27)
CO2: 24 mmol/L (ref 20–29)
Calcium: 9.3 mg/dL (ref 8.7–10.3)
Chloride: 103 mmol/L (ref 96–106)
Creatinine, Ser: 1.11 mg/dL — ABNORMAL HIGH (ref 0.57–1.00)
Glucose: 118 mg/dL — ABNORMAL HIGH (ref 70–99)
Potassium: 3.7 mmol/L (ref 3.5–5.2)
Sodium: 144 mmol/L (ref 134–144)
eGFR: 53 mL/min/{1.73_m2} — ABNORMAL LOW (ref >59)

## 2024-05-01 LAB — BRAIN NATRIURETIC PEPTIDE: BNP: 237.8 pg/mL — ABNORMAL HIGH (ref 0.0–100.0)

## 2024-05-02 ENCOUNTER — Ambulatory Visit: Payer: Self-pay | Admitting: Nurse Practitioner

## 2024-05-02 DIAGNOSIS — R600 Localized edema: Secondary | ICD-10-CM

## 2024-05-02 DIAGNOSIS — R7989 Other specified abnormal findings of blood chemistry: Secondary | ICD-10-CM

## 2024-05-02 NOTE — Progress Notes (Signed)
Pt notified on Mychart.

## 2024-05-02 NOTE — Progress Notes (Signed)
 BNP was elevated. She has swelling in her lower legs but no issues with her breathing when I saw her. Ordered echocardiogram for further evaluation. May consider referral to cardiology pending results. Thanks!

## 2024-05-04 ENCOUNTER — Ambulatory Visit: Payer: Self-pay | Admitting: Nurse Practitioner

## 2024-05-07 ENCOUNTER — Other Ambulatory Visit (HOSPITAL_BASED_OUTPATIENT_CLINIC_OR_DEPARTMENT_OTHER): Payer: Self-pay

## 2024-05-07 DIAGNOSIS — J189 Pneumonia, unspecified organism: Secondary | ICD-10-CM

## 2024-05-07 NOTE — Progress Notes (Signed)
 Pt notified and order placed

## 2024-05-07 NOTE — Progress Notes (Signed)
 Pt is currently not at home when speaking with her husband he states she will be back around 1:00 will try to call back after that

## 2024-05-29 ENCOUNTER — Ambulatory Visit (HOSPITAL_BASED_OUTPATIENT_CLINIC_OR_DEPARTMENT_OTHER)

## 2024-05-29 DIAGNOSIS — R600 Localized edema: Secondary | ICD-10-CM | POA: Diagnosis not present

## 2024-05-29 DIAGNOSIS — J9811 Atelectasis: Secondary | ICD-10-CM | POA: Diagnosis not present

## 2024-05-29 DIAGNOSIS — J189 Pneumonia, unspecified organism: Secondary | ICD-10-CM

## 2024-05-29 DIAGNOSIS — R7989 Other specified abnormal findings of blood chemistry: Secondary | ICD-10-CM | POA: Diagnosis not present

## 2024-05-29 LAB — ECHOCARDIOGRAM COMPLETE
Area-P 1/2: 5.27 cm2
S' Lateral: 3.18 cm

## 2024-06-06 ENCOUNTER — Ambulatory Visit: Payer: Self-pay | Admitting: Nurse Practitioner

## 2024-06-06 NOTE — Progress Notes (Signed)
 CXR improved with diuretic. Possible trace fluid on the left. Will continue to monitor. Thanks.

## 2024-06-06 NOTE — Progress Notes (Signed)
 Pt.notified

## 2024-06-08 ENCOUNTER — Ambulatory Visit: Payer: Self-pay | Admitting: Nurse Practitioner

## 2024-06-08 NOTE — Progress Notes (Signed)
 Echo with nl pumping function. Unable to measure for diastolic dysfunction, which could be contributing to her BLE edema. Mild MR. Recommend keeping f/u with cardiology. Thanks.

## 2024-06-11 NOTE — Progress Notes (Signed)
 Pt.notified

## 2024-07-04 ENCOUNTER — Other Ambulatory Visit: Payer: Self-pay | Admitting: Family Medicine

## 2024-07-04 DIAGNOSIS — Z1231 Encounter for screening mammogram for malignant neoplasm of breast: Secondary | ICD-10-CM

## 2024-07-20 ENCOUNTER — Encounter: Payer: Self-pay | Admitting: Gastroenterology

## 2024-07-20 ENCOUNTER — Ambulatory Visit
Admission: RE | Admit: 2024-07-20 | Discharge: 2024-07-20 | Disposition: A | Discharge status: Home / Self Care | Attending: Gastroenterology | Admitting: Gastroenterology

## 2024-07-20 ENCOUNTER — Ambulatory Visit: Admitting: Anesthesiology

## 2024-07-20 ENCOUNTER — Encounter
Admission: RE | Disposition: A | Discharge status: Home / Self Care | Payer: Self-pay | Source: Home / Self Care | Attending: Gastroenterology

## 2024-07-20 DIAGNOSIS — Z860101 Personal history of adenomatous and serrated colon polyps: Secondary | ICD-10-CM | POA: Diagnosis present

## 2024-07-20 DIAGNOSIS — K219 Gastro-esophageal reflux disease without esophagitis: Secondary | ICD-10-CM | POA: Insufficient documentation

## 2024-07-20 DIAGNOSIS — E119 Type 2 diabetes mellitus without complications: Secondary | ICD-10-CM | POA: Diagnosis not present

## 2024-07-20 DIAGNOSIS — J4489 Other specified chronic obstructive pulmonary disease: Secondary | ICD-10-CM | POA: Insufficient documentation

## 2024-07-20 DIAGNOSIS — K5900 Constipation, unspecified: Secondary | ICD-10-CM | POA: Insufficient documentation

## 2024-07-20 DIAGNOSIS — M1990 Unspecified osteoarthritis, unspecified site: Secondary | ICD-10-CM | POA: Insufficient documentation

## 2024-07-20 DIAGNOSIS — I252 Old myocardial infarction: Secondary | ICD-10-CM | POA: Insufficient documentation

## 2024-07-20 DIAGNOSIS — Z7984 Long term (current) use of oral hypoglycemic drugs: Secondary | ICD-10-CM | POA: Diagnosis not present

## 2024-07-20 DIAGNOSIS — D120 Benign neoplasm of cecum: Secondary | ICD-10-CM | POA: Diagnosis not present

## 2024-07-20 DIAGNOSIS — K640 First degree hemorrhoids: Secondary | ICD-10-CM | POA: Insufficient documentation

## 2024-07-20 DIAGNOSIS — D122 Benign neoplasm of ascending colon: Secondary | ICD-10-CM | POA: Insufficient documentation

## 2024-07-20 DIAGNOSIS — Z79899 Other long term (current) drug therapy: Secondary | ICD-10-CM | POA: Insufficient documentation

## 2024-07-20 DIAGNOSIS — I10 Essential (primary) hypertension: Secondary | ICD-10-CM | POA: Diagnosis not present

## 2024-07-20 DIAGNOSIS — Z1211 Encounter for screening for malignant neoplasm of colon: Secondary | ICD-10-CM | POA: Insufficient documentation

## 2024-07-20 HISTORY — PX: COLONOSCOPY: SHX5424

## 2024-07-20 HISTORY — PX: POLYPECTOMY: SHX149

## 2024-07-20 LAB — GLUCOSE, CAPILLARY: Glucose-Capillary: 88 mg/dL (ref 70–99)

## 2024-07-20 SURGERY — COLONOSCOPY
Anesthesia: General

## 2024-07-20 MED ORDER — SODIUM CHLORIDE 0.9 % IV SOLN
INTRAVENOUS | Status: DC
  Administered 2024-07-20: 20 mL/h via INTRAVENOUS

## 2024-07-20 MED ORDER — LIDOCAINE HCL (CARDIAC) PF 100 MG/5ML IV SOSY
PREFILLED_SYRINGE | INTRAVENOUS | Status: DC | PRN
  Administered 2024-07-20: 50 mg via INTRAVENOUS

## 2024-07-20 MED ORDER — PROPOFOL 10 MG/ML IV BOLUS
INTRAVENOUS | Status: DC | PRN
  Administered 2024-07-20: 20 mg via INTRAVENOUS
  Administered 2024-07-20: 125 ug/kg/min via INTRAVENOUS
  Administered 2024-07-20: 50 mg via INTRAVENOUS

## 2024-07-20 MED ORDER — LIDOCAINE HCL (PF) 2 % IJ SOLN
INTRAMUSCULAR | Status: AC
  Filled 2024-07-20: qty 5

## 2024-07-20 MED ORDER — DEXMEDETOMIDINE HCL IN NACL 80 MCG/20ML IV SOLN
INTRAVENOUS | Status: DC | PRN
  Administered 2024-07-20: 8 ug via INTRAVENOUS

## 2024-07-20 MED ORDER — PROPOFOL 1000 MG/100ML IV EMUL
INTRAVENOUS | Status: AC
  Filled 2024-07-20: qty 100

## 2024-07-20 NOTE — Op Note (Signed)
 [facility] Gastroenterology Patient Name: Misty Briggs Procedure Date: 07/20/2024 7:18 AM MRN: [id number] Account #: [id number] Date of Birth: 1951-08-22 Admit Type: Outpatient Age: 73 Room: [facility] ENDO ROOM 1 Gender: Female Note Status: Finalized Instrument Name: Colon Scope [id number] Procedure:             Colonoscopy Indications:           High risk colon cancer surveillance: Personal history                         of colonic polyps Providers:             Elspeth Ozell Jungling DO, DO Referring MD:          Eloise EUSTACE [place] (Referring MD) Medicines:             Monitored Anesthesia Care Complications:         No immediate complications. Estimated blood loss:                         Minimal. Procedure:             Pre-Anesthesia Assessment:                        - Prior to the procedure, a History and Physical was                         performed, and patient medications and allergies were                         reviewed. The patient is competent. The risks and                         benefits of the procedure and the sedation options and                         risks were discussed with the patient. All questions                         were answered and informed consent was obtained.                         Patient identification and proposed procedure were                         verified by the physician, the nurse, the anesthetist                         and the technician in the endoscopy suite. Mental                         Status Examination: alert and oriented. Airway                         Examination: normal oropharyngeal airway and neck                         mobility. Respiratory Examination: clear to  auscultation. CV Examination: RRR, no murmurs, no S3                         or S4. Prophylactic Antibiotics: The patient does not                         require prophylactic antibiotics. Prior                          Anticoagulants: The patient has taken no anticoagulant                         or antiplatelet agents. ASA Grade Assessment: III - A                         patient with severe systemic disease. After reviewing                         the risks and benefits, the patient was deemed in                         satisfactory condition to undergo the procedure. The                         anesthesia plan was to use monitored anesthesia care                         (MAC). Immediately prior to administration of                         medications, the patient was re-assessed for adequacy                         to receive sedatives. The heart rate, respiratory                         rate, oxygen saturations, blood pressure, adequacy of                         pulmonary ventilation, and response to care were                         monitored throughout the procedure. The physical                         status of the patient was re-assessed after the                         procedure.                        After obtaining informed consent, the colonoscope was                         passed under direct vision. Throughout the procedure,                         the patient's blood pressure, pulse, and oxygen  saturations were monitored continuously. The was                         introduced through the anus and advanced to the the                         cecum, identified by appendiceal orifice and ileocecal                         valve. The colonoscopy was somewhat difficult due to a                         redundant colon and significant looping. Successful                         completion of the procedure was aided by applying                         abdominal pressure and lavage. The ileocecal valve,                         appendiceal orifice, and rectum were photographed. Findings:      The perianal and digital rectal examinations were normal. Pertinent        negatives include normal sphincter tone.      Non-bleeding internal hemorrhoids were found during retroflexion. The       hemorrhoids were Grade I (internal hemorrhoids that do not prolapse).       Estimated blood loss: none.      Retroflexion in the right colon was performed.      Five sessile polyps were found in the ascending colon (4) and cecum (1).       The polyps were 1 to 2 mm in size. These polyps were removed with a       jumbo cold forceps. Resection and retrieval were complete.      Two sessile polyps were found in the ascending colon. The polyps were 3       to 4 mm in size. These polyps were removed with a cold snare. Resection       and retrieval were complete. Estimated blood loss was minimal.      The exam was otherwise without abnormality on direct and retroflexion       views. Impression:            - Non-bleeding internal hemorrhoids.                        - Five 1 to 2 mm polyps in the ascending colon and in                         the cecum, removed with a jumbo cold forceps. Resected                         and retrieved.                        - Two 3 to 4 mm polyps in the ascending colon, removed  with a cold snare. Resected and retrieved.                        - The examination was otherwise normal on direct and                         retroflexion views. Recommendation:        - Patient has a contact number available for                         emergencies. The signs and symptoms of potential                         delayed complications were discussed with the patient.                         Return to normal activities tomorrow. Written                         discharge instructions were provided to the patient.                        - Discharge patient to home.                        - Resume previous diet.                        - Continue present medications.                        - No ibuprofen , naproxen, or other non-steroidal                          anti-inflammatory drugs for 5 days after polyp removal.                        - Await pathology results.                        - Repeat colonoscopy for surveillance based on                         pathology results.                        - Return to referring physician as previously                         scheduled.                        - The findings and recommendations were discussed with                         the patient. Procedure Code(s):     --- Professional ---                        [phone], Colonoscopy, flexible; with removal of  tumor(s), polyp(s), or other lesion(s) by snare                         technique                        [id number], 59, Colonoscopy, flexible; with biopsy, single                         or multiple Diagnosis Code(s):     --- Professional ---                        Z86.010, Personal history of colonic polyps                        D12.0, Benign neoplasm of cecum                        D12.2, Benign neoplasm of ascending colon                        K64.0, First degree hemorrhoids CPT copyright 2022 [employer]. All rights reserved. The codes documented in this report are preliminary and upon coder review may  be revised to meet current compliance requirements. Attending Participation:      I personally performed the entire procedure. Elspeth Jungling, DO Elspeth Ozell Jungling DO, DO 07/20/2024 8:21:50 AM This report has been signed electronically. Number of Addenda: 0 Note Initiated On: 07/20/2024 7:18 AM Scope Withdrawal Time: 0 hours 26 minutes 48 seconds  Total Procedure Duration: 0 hours 34 minutes 41 seconds  Estimated Blood Loss:  Estimated blood loss was minimal.      [facility]

## 2024-07-20 NOTE — H&P (Signed)
 Pre-Procedure H&P   Patient ID: Misty Briggs is a 73 y.o. female.  Gastroenterology Provider: Elspeth Ozell Jungling, DO  Referring Provider: Romero Antigua, PA PCP: Valora Agent, MD  Date: 07/20/2024  HPI Ms. Misty Briggs is a 73 y.o. female who presents today for Colonoscopy for personal history of colon polyps .  Underwent colonoscopy May 2024 with 13 adenomatous polyps removed.  Internal hemorrhoids were also appreciated.  1 year surveillance follow-up given the number of polyps.  Redundant/looping colon at that time.  No family history of colon cancer or colon polyps.  Constipation without melena or hematochezia resolves with occasional MiraLAX.  Hemoglobin 11 MCV 91.5 platelets 288,000 creatinine 1.1   Past Medical History:  Diagnosis Date   Arthritis    Asthma    COPD (chronic obstructive pulmonary disease) (HCC)    Dr. Theotis, [facility] pulmonologist   Diabetes mellitus    Fever of unknown origin 02/28/2020   GERD (gastroesophageal reflux disease)    Heart murmur    History of COVID-19 11/03/2020   HLD (hyperlipidemia)    Hypertension    Myocardial infarction (HCC)    mild   Pneumonia due to COVID-19 virus 09/26/2019   PONV (postoperative nausea and vomiting)    itching sometimes   Rheumatoid arthritis(714.0)    Shortness of breath     Past Surgical History:  Procedure Laterality Date   ANTERIOR CERVICAL DECOMP/DISCECTOMY FUSION  05/05/2012   Procedure: ANTERIOR CERVICAL DECOMPRESSION/DISCECTOMY FUSION 1 LEVEL;  Surgeon: Catalina CHRISTELLA Stains, MD;  Location: [facility];  Service: Neurosurgery;  Laterality: N/A;  Cervical six seven Anterior cervical decompression/diskectomy, Fusion, Plate   COLONOSCOPY N/A 04/01/2023   Procedure: COLONOSCOPY;  Surgeon: Jungling Elspeth Ozell, DO;  Location: [facility] ENDOSCOPY;  Service: Gastroenterology;  Laterality: N/A;   COLONOSCOPY WITH PROPOFOL  N/A 09/08/2017   Procedure: COLONOSCOPY WITH PROPOFOL ;  Surgeon:  Gaylyn Gladis PENNER, MD;  Location: [facility] ENDOSCOPY;  Service: Endoscopy;  Laterality: N/A;   ENDOMETRIAL ABLATION     novasure   ESOPHAGOGASTRODUODENOSCOPY (EGD) WITH PROPOFOL  N/A 04/01/2023   Procedure: ESOPHAGOGASTRODUODENOSCOPY (EGD) WITH PROPOFOL ;  Surgeon: Jungling Elspeth Ozell, DO;  Location: [facility] ENDOSCOPY;  Service: Gastroenterology;  Laterality: N/A;   FOOT SURGERY  2015   ROTATOR CUFF REPAIR     bilat   TRANSTHORACIC ECHOCARDIOGRAM  2010   [facility]   TUBAL LIGATION      Family History No h/o GI disease or malignancy  Review of Systems  Constitutional:  Negative for activity change, appetite change, chills, diaphoresis, fatigue, fever and unexpected weight change.  HENT:  Negative for trouble swallowing and voice change.   Respiratory:  Negative for shortness of breath and wheezing.   Cardiovascular:  Negative for chest pain, palpitations and leg swelling.  Gastrointestinal:  Negative for abdominal distention, abdominal pain, anal bleeding, blood in stool, constipation, diarrhea, nausea, rectal pain and vomiting.  Musculoskeletal:  Negative for arthralgias and myalgias.  Skin:  Negative for color change and pallor.  Neurological:  Negative for dizziness, syncope and weakness.  Psychiatric/Behavioral:  Negative for confusion.   All other systems reviewed and are negative.    Medications No current facility-administered medications on file prior to encounter.   Current Outpatient Medications on File Prior to Encounter  Medication Sig Dispense Refill   albuterol  (PROVENTIL ) (2.5 MG/3ML) 0.083% nebulizer solution Twice daily for 3-5 days until better 75 mL 0   albuterol  (VENTOLIN  HFA) 108 (90 Base) MCG/ACT inhaler Inhale 1-2 puffs into  the lungs every 6 (six) hours as needed for wheezing or shortness of breath. 8 g 1   Ascorbic Acid  (VITAMIN C ) 1000 MG tablet Take 1,000 mg by mouth daily.     aspirin  EC 81 MG tablet Take by mouth daily.       Budeson-Glycopyrrol-Formoterol (BREZTRI  AEROSPHERE) 160-9-4.8 MCG/ACT AERO Inhale 2 puffs into the lungs in the morning and at bedtime. 32.1 g 1   calcium  carbonate (OS-CAL - DOSED IN MG OF ELEMENTAL CALCIUM ) 1250 (500 Ca) MG tablet Take 2 tablets by mouth daily with breakfast.     Coenzyme Q10 (CO Q-10 PO) Take by mouth daily.     EPINEPHrine 0.3 mg/0.3 mL IJ SOAJ injection Inject 0.3 mLs into the muscle once as needed for anaphylaxis.     fluticasone  (FLONASE ) 50 MCG/ACT nasal spray Place 1 spray into both nostrils daily as needed for allergies or rhinitis.     furosemide  (LASIX ) 20 MG tablet Take 20 mg by mouth daily.      gabapentin  (NEURONTIN ) 100 MG capsule Take 3 capsules by mouth at bedtime.     hydroxychloroquine  (PLAQUENIL ) 200 MG tablet Monday -Friday take 2 tablets Saturday and Sunday take 1 tablet     hydrOXYzine  (ATARAX /VISTARIL ) 25 MG tablet Take 25 mg by mouth every evening.     ipratropium-albuterol  (DUONEB) 0.5-2.5 (3) MG/3ML SOLN Take 3 mLs by nebulization every 6 (six) hours as needed. 360 mL 2   levocetirizine (XYZAL ) 5 MG tablet Take 5 mg by mouth every evening.     lidocaine  (LIDODERM ) 5 % Place 1 patch onto the skin daily. Remove & Discard patch within 12 hours or as directed by MD 30 patch 0   lovastatin (MEVACOR) 20 MG tablet Take 20 mg by mouth at bedtime.     metoprolol  tartrate (LOPRESSOR ) 50 MG tablet Take 50 mg by mouth 2 (two) times daily.     montelukast  (SINGULAIR ) 10 MG tablet 1 tablet in the evening     Multiple Vitamin (MULTIVITAMIN) capsule Take 1 capsule by mouth daily.     nystatin  (MYCOSTATIN ) 100000 UNIT/ML suspension Take 5 mLs (500,000 Units total) by mouth 4 (four) times daily. 150 mL 0   omeprazole (PRILOSEC) 20 MG capsule Take 20 mg by mouth daily.     potassium chloride  (KLOR-CON ) 10 MEQ tablet Take 10 mEq by mouth daily.     vitamin B-12 (CYANOCOBALAMIN ) 1000 MCG tablet Take 1,000 mcg by mouth daily.     amLODipine  (NORVASC ) 5 MG tablet Take 1  tablet (5 mg total) by mouth daily. 90 tablet 0   ferrous sulfate  325 (65 FE) MG tablet Take 1 tablet (325 mg total) by mouth 2 (two) times daily with a meal. 60 tablet 0   guaiFENesin -dextromethorphan  (ROBITUSSIN DM) 100-10 MG/5ML syrup Take 5 mLs by mouth every 4 (four) hours as needed for cough (chest congestion). 118 mL 0   metFORMIN  (GLUCOPHAGE ) 1000 MG tablet Take 1 tablet (1,000 mg total) by mouth 2 (two) times daily with a meal. (Patient taking differently: Take 500 mg by mouth 2 (two) times daily with a meal.) 60 tablet 0    Pertinent medications related to GI and procedure were reviewed by me with the patient prior to the procedure   Current Facility-Administered Medications:    0.9 %  sodium chloride  infusion, , Intravenous, Continuous, Onita Elspeth Sharper, DO, Last Rate: 20 mL/hr at 07/20/24 0706, 20 mL/hr at 07/20/24 0706  sodium chloride  20 mL/hr (07/20/24 0706)  Allergies  Allergen Reactions   Doxycycline  Other (See Comments)    Thrush   Penicillins Hives, Palpitations and Rash   Azithromycin  Other (See Comments)    Thrush   Levofloxacin  Other (See Comments)    Joint pain/muscle pain   Sulfa Antibiotics Rash   Allergies were reviewed by me prior to the procedure  Objective   Body mass index is 24.95 kg/m. Vitals:   07/20/24 0650  BP: 135/67  Pulse: 88  Resp: 20  Temp: (!) 96.1 F (35.6 C)  TempSrc: Temporal  SpO2: 100%  Weight: 70.1 kg  Height: 5' 6 (1.676 m)     Physical Exam Vitals and nursing note reviewed.  Constitutional:      General: She is not in acute distress.    Appearance: Normal appearance. She is not ill-appearing, toxic-appearing or diaphoretic.  HENT:     Head: Normocephalic and atraumatic.     Nose: Nose normal.     Mouth/Throat:     Mouth: Mucous membranes are moist.     Pharynx: Oropharynx is clear.  Eyes:     General: No scleral icterus.    Extraocular Movements: Extraocular movements intact.  Cardiovascular:      Rate and Rhythm: Normal rate and regular rhythm.     Heart sounds: Normal heart sounds.  Pulmonary:     Effort: Pulmonary effort is normal. No respiratory distress.     Breath sounds: Normal breath sounds. No wheezing, rhonchi or rales.  Abdominal:     General: Bowel sounds are normal. There is no distension.     Palpations: Abdomen is soft.     Tenderness: There is no abdominal tenderness. There is no guarding or rebound.  Musculoskeletal:     Cervical back: Neck supple.     Right lower leg: No edema.     Left lower leg: No edema.  Skin:    General: Skin is warm and dry.     Coloration: Skin is not jaundiced or pale.  Neurological:     General: No focal deficit present.     Mental Status: She is alert and oriented to person, place, and time. Mental status is at baseline.  Psychiatric:        Mood and Affect: Mood normal.        Behavior: Behavior normal.        Thought Content: Thought content normal.        Judgment: Judgment normal.      Assessment:  Misty Briggs is a 73 y.o. female  who presents today for Colonoscopy for personal history of colon polyps .  Plan:  Colonoscopy with possible intervention today  Colonoscopy with possible biopsy, control of bleeding, polypectomy, and interventions as necessary has been discussed with the patient/patient representative. Informed consent was obtained from the patient/patient representative after explaining the indication, nature, and risks of the procedure including but not limited to death, bleeding, perforation, missed neoplasm/lesions, cardiorespiratory compromise, and reaction to medications. Opportunity for questions was given and appropriate answers were provided. Patient/patient representative has verbalized understanding is amenable to undergoing the procedure.   Elspeth Ozell Jungling, DO  [facility] Gastroenterology  Portions of the record may have been created with voice recognition software. Occasional  wrong-word or 'sound-a-like' substitutions may have occurred due to the inherent limitations of voice recognition software.  Read the chart carefully and recognize, using context, where substitutions may have occurred.

## 2024-07-20 NOTE — Anesthesia Postprocedure Evaluation (Signed)
 Anesthesia Post Note  Patient: Misty Briggs  Procedure(s) Performed: COLONOSCOPY POLYPECTOMY, INTESTINE  Patient location during evaluation: PACU Anesthesia Type: General Level of consciousness: awake Pain management: satisfactory to patient Vital Signs Assessment: post-procedure vital signs reviewed and stable Respiratory status: spontaneous breathing and nonlabored ventilation Cardiovascular status: stable Anesthetic complications: no   No notable events documented.   Last Vitals:  Vitals:   07/20/24 0824 07/20/24 0834  BP: 123/68 123/67  Pulse: 71 71  Resp: 18 18  Temp:    SpO2: 100% 100%    Last Pain:  Vitals:   07/20/24 0834  TempSrc:   PainSc: 0-No pain                 VAN STAVEREN,Atthew Coutant

## 2024-07-20 NOTE — Anesthesia Preprocedure Evaluation (Signed)
 Anesthesia Evaluation  Patient identified by MRN, date of birth, ID band Patient awake    Reviewed: Allergy & Precautions, NPO status , Patient's Chart, lab work & pertinent test results  History of Anesthesia Complications (+) PONV and history of anesthetic complications  Airway Mallampati: II  TM Distance: >3 FB Neck ROM: full    Dental  (+) Lower Dentures   Pulmonary neg pulmonary ROS, shortness of breath and with exertion, COPD   Pulmonary exam normal  + decreased breath sounds      Cardiovascular Exercise Tolerance: Good hypertension, Pt. on medications + Past MI  negative cardio ROS Normal cardiovascular exam Rhythm:Regular Rate:Normal     Neuro/Psych negative neurological ROS  negative psych ROS   GI/Hepatic negative GI ROS, Neg liver ROS,GERD  Controlled,,  Endo/Other  negative endocrine ROSdiabetes, Type 2, Oral Hypoglycemic Agents    Renal/GU negative Renal ROS  negative genitourinary   Musculoskeletal  (+) Arthritis ,    Abdominal   Peds negative pediatric ROS (+)  Hematology negative hematology ROS (+)   Anesthesia Other Findings Past Medical History: No date: Arthritis No date: Asthma No date: COPD (chronic obstructive pulmonary disease) (HCC)     Comment:  Dr. Theotis, [facility] pulmonologist No date: Diabetes mellitus 02/28/2020: Fever of unknown origin No date: GERD (gastroesophageal reflux disease) No date: Heart murmur 11/03/2020: History of COVID-19 No date: HLD (hyperlipidemia) No date: Hypertension No date: Myocardial infarction [facility])     Comment:  mild 09/26/2019: Pneumonia due to COVID-19 virus No date: PONV (postoperative nausea and vomiting)     Comment:  itching sometimes No date: Rheumatoid arthritis(714.0) No date: Shortness of breath  Past Surgical History: 05/05/2012: ANTERIOR CERVICAL DECOMP/DISCECTOMY FUSION     Comment:  Procedure: ANTERIOR CERVICAL  DECOMPRESSION/DISCECTOMY               FUSION 1 LEVEL;  Surgeon: Catalina CHRISTELLA Stains, MD;                Location: [facility] NEURO ORS;  Service: Neurosurgery;                Laterality: N/A;  Cervical six seven Anterior cervical               decompression/diskectomy, Fusion, Plate 4/89/7975: COLONOSCOPY; N/A     Comment:  Procedure: COLONOSCOPY;  Surgeon: Onita Elspeth Sharper,              DO;  Location: [facility] ENDOSCOPY;  Service:               Gastroenterology;  Laterality: N/A; 09/08/2017: COLONOSCOPY WITH PROPOFOL ; N/A     Comment:  Procedure: COLONOSCOPY WITH PROPOFOL ;  Surgeon:               Gaylyn Gladis PENNER, MD;  Location: [facility] ENDOSCOPY;                Service: Endoscopy;  Laterality: N/A; No date: ENDOMETRIAL ABLATION     Comment:  novasure 04/01/2023: ESOPHAGOGASTRODUODENOSCOPY (EGD) WITH PROPOFOL ; N/A     Comment:  Procedure: ESOPHAGOGASTRODUODENOSCOPY (EGD) WITH               PROPOFOL ;  Surgeon: Onita Elspeth Sharper, DO;  Location:              [facility] ENDOSCOPY;  Service: Gastroenterology;  Laterality:               N/A; 2015: FOOT SURGERY No date: ROTATOR CUFF REPAIR  Comment:  bilat 2010: TRANSTHORACIC ECHOCARDIOGRAM     Comment:  [facility] No date: TUBAL LIGATION  BMI    Body Mass Index: 24.95 kg/m      Reproductive/Obstetrics negative OB ROS                              Anesthesia Physical Anesthesia Plan  ASA: 3  Anesthesia Plan: General   Post-op Pain Management:    Induction: Intravenous  PONV Risk Score and Plan: Propofol  infusion and TIVA  Airway Management Planned: Natural Airway and Nasal Cannula  Additional Equipment:   Intra-op Plan:   Post-operative Plan:   Informed Consent: I have reviewed the patients History and Physical, chart, labs and discussed the procedure including the risks, benefits and alternatives for the proposed anesthesia with the patient or authorized representative who has indicated  his/her understanding and acceptance.     Dental Advisory Given  Plan Discussed with: CRNA  Anesthesia Plan Comments:         Anesthesia Quick Evaluation

## 2024-07-20 NOTE — Transfer of Care (Signed)
 Immediate Anesthesia Transfer of Care Note  Patient: Misty Briggs  Procedure(s) Performed: COLONOSCOPY POLYPECTOMY, INTESTINE  Patient Location: Endoscopy Unit  Anesthesia Type:General  Level of Consciousness: awake and drowsy  Airway & Oxygen Therapy: Patient Spontanous Breathing  Post-op Assessment: Report given to RN and Post -op Vital signs reviewed and stable  Post vital signs: Reviewed and stable  Last Vitals:  Vitals Value Taken Time  BP 114/60 07/20/24 08:15  Temp 35.1 C 07/20/24 08:15  Pulse 70 07/20/24 08:15  Resp 18 07/20/24 08:15  SpO2 100 % 07/20/24 08:15    Last Pain:  Vitals:   07/20/24 0815  TempSrc: Tympanic  PainSc: Asleep         Complications: No notable events documented.

## 2024-07-20 NOTE — Interval H&P Note (Signed)
 History and Physical Interval Note: Preprocedure H&P from 07/20/24  was reviewed and there was no interval change after seeing and examining the patient.  Written consent was obtained from the patient after discussion of risks, benefits, and alternatives. Patient has consented to proceed with Colonoscopy with possible intervention   07/20/2024 7:14 AM  Misty Briggs  has presented today for surgery, with the diagnosis of Z86.0100 (ICD-10-CM) - History of colon polyps.  The various methods of treatment have been discussed with the patient and family. After consideration of risks, benefits and other options for treatment, the patient has consented to  Procedure(s) with comments: COLONOSCOPY (N/A) - DM as a surgical intervention.  The patient's history has been reviewed, patient examined, no change in status, stable for surgery.  I have reviewed the patient's chart and labs.  Questions were answered to the patient's satisfaction.     Elspeth Ozell Jungling

## 2024-07-20 NOTE — Anesthesia Procedure Notes (Signed)
 Date/Time: 07/20/2024 7:30 AM  Performed by: Dominica Krabbe, CRNAPre-anesthesia Checklist: Patient identified, Emergency Drugs available, Suction available, Patient being monitored and Timeout performed Patient Re-evaluated:Patient Re-evaluated prior to induction Oxygen Delivery Method: Nasal cannula Preoxygenation: Pre-oxygenation with 100% oxygen Induction Type: IV induction

## 2024-07-24 LAB — SURGICAL PATHOLOGY

## 2024-07-26 ENCOUNTER — Ambulatory Visit
Admission: RE | Admit: 2024-07-26 | Discharge: 2024-07-26 | Disposition: A | Discharge status: Home / Self Care | Source: Ambulatory Visit | Attending: Family Medicine | Admitting: Family Medicine

## 2024-07-26 DIAGNOSIS — Z1231 Encounter for screening mammogram for malignant neoplasm of breast: Secondary | ICD-10-CM | POA: Diagnosis present

## 2024-08-20 ENCOUNTER — Telehealth (HOSPITAL_BASED_OUTPATIENT_CLINIC_OR_DEPARTMENT_OTHER): Payer: Self-pay

## 2024-08-20 NOTE — Telephone Encounter (Signed)
 Copied from CRM #[id number]. Topic: Clinical - Medication Question >> Aug 17, 2024  9:48 AM Lavanda D wrote: Reason for CRM: Patient is calling about her Budeson-Glycopyrrol-Formoterol (BREZTRI  AEROSPHERE) 160-9-4.8 MCG/ACT AERO - She said she has enough to last her until November but she has had trouble in the past with going a few months without it when it is due, she is wondering if the order to start in November can be sent/she said one of the nurses there also helps her get it at a better price.

## 2024-09-11 ENCOUNTER — Other Ambulatory Visit (HOSPITAL_BASED_OUTPATIENT_CLINIC_OR_DEPARTMENT_OTHER): Payer: Self-pay

## 2024-09-11 MED ORDER — BREZTRI AEROSPHERE 160-9-4.8 MCG/ACT IN AERO: 2.0000 | INHALATION_SPRAY | Freq: Two times a day (BID) | RESPIRATORY_TRACT | 10 refills | Status: AC

## 2024-09-20 NOTE — Telephone Encounter (Signed)
 Form faxed to [place] and [place], confirmation recevied

## 2024-10-15 ENCOUNTER — Ambulatory Visit (INDEPENDENT_AMBULATORY_CARE_PROVIDER_SITE_OTHER): Admitting: Pulmonary Disease

## 2024-10-15 ENCOUNTER — Encounter (HOSPITAL_BASED_OUTPATIENT_CLINIC_OR_DEPARTMENT_OTHER): Payer: Self-pay | Admitting: Pulmonary Disease

## 2024-10-15 VITALS — BP 123/57 | HR 76 | Ht 66.0 in | Wt 156.0 lb

## 2024-10-15 DIAGNOSIS — J4489 Other specified chronic obstructive pulmonary disease: Secondary | ICD-10-CM | POA: Diagnosis not present

## 2024-10-15 DIAGNOSIS — J9611 Chronic respiratory failure with hypoxia: Secondary | ICD-10-CM

## 2024-10-15 NOTE — Patient Instructions (Signed)
  Asthma-COPD - well controlled --CONTINUE Breztri  TWO puffs TWICE a day.  --CONTINUE Albuterol  as needed for shortness of breath or wheezing. REFILLED --Encourage regular aerobic activity as tolerated --Up to date on RSV and influenza  Chronic hypoxemic respiratory failure -improved Nocturnal hypoxemia --DME: [facility] office  --Ambulatory O2 goal >88% --CONTINUE 2L with activity and sleep --Due for ONO in April 2025

## 2024-10-15 NOTE — Progress Notes (Signed)
 Subjective:   PATIENT ID: Misty Briggs GENDER: female DOB: [birth year], MRN: [id number]   HPI  Chief Complaint  Patient presents with   Asthma    Reason for Visit: Follow-up  [name] is 73 year old female never smoker with asthma-COPD, rheumatoid arthritis and hx covid pneumonia 09/2019 who presents for follow-up.  Synopsis: She was previously seen in [facility] in [place] by Dr. Tamea. Established care at [facility] in [place] in 2021. Followed by [facility] in [place] for weekly shots. Fall is usually her worse season with some hoarseness with her allergies. Compliant with oxygen nightly.   02/02/24 Since our last visit she was previously well controlled on Breztri  and nocturnal O2. Recently went to a baby shower. She reports worsening leg swelling two weeks ago. Began having shortness of breath, productive cough, nasal congestion and low grade fevers. Son-in-law with covid. She usually gets sick at this time of the year (fall/early spring). Compliant with O2 nightly and considering switching DME providers.  03/15/24 Since our last visit she was seen by Dr. Jude for acute visit on 03/01/24 for fever and cough so sent to the ED. Admitted and +parainfluenza. Treated with steroids, ceftriaxone /azithromycin , and inhalers and discharged home with oxygen 2L. She completed antibiotic yesterday but continues to have shortness of breath and unchanged oxygen requirement. Also feels like she has left chest pain tenderness. Worsened with cough. Denies wheezing.  10/15/24 Since our last visit she is doing much better. Denies shortness of breath or wheezing. Occasional cough related to allergy. Still receiving allergy shots weekly with [place]. Only need oxygen at night now. Ambulatory O2 improved during the day. Compliant with Breztri . Active all day. Has chronic lower extremity swelling that is being managed by PCP.   Prior inhalers: Does not tolerate powder  forms  Social History: Husband has COPD   Past Medical History:  Diagnosis Date   Arthritis    Asthma    COPD (chronic obstructive pulmonary disease) (HCC)    Dr. Theotis, [facility] pulmonologist   Diabetes mellitus    Fever of unknown origin 02/28/2020   GERD (gastroesophageal reflux disease)    Heart murmur    History of COVID-19 11/03/2020   HLD (hyperlipidemia)    Hypertension    Myocardial infarction (HCC)    mild   Pneumonia due to COVID-19 virus 09/26/2019   PONV (postoperative nausea and vomiting)    itching sometimes   Rheumatoid arthritis(714.0)    Shortness of breath      Allergies  Allergen Reactions   Doxycycline  Other (See Comments)    Thrush   Penicillins Hives, Palpitations and Rash   Azithromycin  Other (See Comments)    Thrush   Levofloxacin  Other (See Comments)    Joint pain/muscle pain   Sulfa Antibiotics Rash     Outpatient Medications Prior to Visit  Medication Sig Dispense Refill   albuterol  (PROVENTIL ) (2.5 MG/3ML) 0.083% nebulizer solution Twice daily for 3-5 days until better 75 mL 0   albuterol  (VENTOLIN  HFA) 108 (90 Base) MCG/ACT inhaler Inhale 1-2 puffs into the lungs every 6 (six) hours as needed for wheezing or shortness of breath. 8 g 1   amLODipine  (NORVASC ) 5 MG tablet Take 1 tablet (5 mg total) by mouth daily. 90 tablet 0   Ascorbic Acid  (VITAMIN C ) 1000 MG tablet Take 1,000 mg by mouth daily.     aspirin  EC 81 MG tablet Take by mouth daily.      budesonide -glycopyrrolate -formoterol (  BREZTRI  AEROSPHERE) 160-9-4.8 MCG/ACT AERO inhaler Inhale 2 puffs into the lungs in the morning and at bedtime. 30.6 g 10   calcium  carbonate (OS-CAL - DOSED IN MG OF ELEMENTAL CALCIUM ) 1250 (500 Ca) MG tablet Take 2 tablets by mouth daily with breakfast.     Coenzyme Q10 (CO Q-10 PO) Take by mouth daily.     empagliflozin (JARDIANCE) 10 MG TABS tablet Take 10 mg by mouth daily.     EPINEPHrine 0.3 mg/0.3 mL IJ SOAJ injection Inject 0.3 mLs  into the muscle once as needed for anaphylaxis.     ferrous sulfate  325 (65 FE) MG tablet Take 1 tablet (325 mg total) by mouth 2 (two) times daily with a meal. 60 tablet 0   fluticasone  (FLONASE ) 50 MCG/ACT nasal spray Place 1 spray into both nostrils daily as needed for allergies or rhinitis.     guaiFENesin -dextromethorphan  (ROBITUSSIN DM) 100-10 MG/5ML syrup Take 5 mLs by mouth every 4 (four) hours as needed for cough (chest congestion). 118 mL 0   hydroxychloroquine  (PLAQUENIL ) 200 MG tablet Monday -Friday take 2 tablets Saturday and Sunday take 1 tablet     hydrOXYzine  (ATARAX /VISTARIL ) 25 MG tablet Take 25 mg by mouth every evening.     ipratropium-albuterol  (DUONEB) 0.5-2.5 (3) MG/3ML SOLN Take 3 mLs by nebulization every 6 (six) hours as needed. 360 mL 2   levocetirizine (XYZAL ) 5 MG tablet Take 5 mg by mouth every evening.     lidocaine  (LIDODERM ) 5 % Place 1 patch onto the skin daily. Remove & Discard patch within 12 hours or as directed by MD 30 patch 0   lovastatin (MEVACOR) 20 MG tablet Take 20 mg by mouth at bedtime.     metFORMIN  (GLUCOPHAGE ) 1000 MG tablet Take 1 tablet (1,000 mg total) by mouth 2 (two) times daily with a meal. (Patient taking differently: Take 500 mg by mouth 2 (two) times daily with a meal.) 60 tablet 0   metoprolol  tartrate (LOPRESSOR ) 50 MG tablet Take 50 mg by mouth 2 (two) times daily.     montelukast  (SINGULAIR ) 10 MG tablet 1 tablet in the evening     Multiple Vitamin (MULTIVITAMIN) capsule Take 1 capsule by mouth daily.     omeprazole (PRILOSEC) 20 MG capsule Take 20 mg by mouth daily.     potassium chloride  (KLOR-CON ) 10 MEQ tablet Take 10 mEq by mouth daily.     pregabalin (LYRICA) 25 MG capsule Take 25 mg by mouth 4 (four) times daily.     vitamin B-12 (CYANOCOBALAMIN ) 1000 MCG tablet Take 1,000 mcg by mouth daily.     furosemide  (LASIX ) 20 MG tablet Take 20 mg by mouth daily.  (Patient not taking: Reported on 10/15/2024)     gabapentin  (NEURONTIN ) 100  MG capsule Take 3 capsules by mouth at bedtime. (Patient not taking: Reported on 10/15/2024)     nystatin  (MYCOSTATIN ) 100000 UNIT/ML suspension Take 5 mLs (500,000 Units total) by mouth 4 (four) times daily. (Patient not taking: Reported on 10/15/2024) 150 mL 0   No facility-administered medications prior to visit.    Review of Systems  Constitutional:  Negative for chills, diaphoresis, fever, malaise/fatigue and weight loss.  HENT:  Negative for congestion.   Respiratory:  Negative for cough, hemoptysis, sputum production, shortness of breath and wheezing.   Cardiovascular:  Negative for chest pain, palpitations and leg swelling.    Objective:   Vitals:   10/15/24 1315  BP: (!) 123/57  Pulse: 76  SpO2: 98%  Weight: 156 lb (70.8 kg)  Height: 5' 6 (1.676 m)     SpO2: 98 %  Physical Exam: General: Well-appearing, no acute distress HENT: [place], AT Eyes: EOMI, no scleral icterus Respiratory: Clear to auscultation bilaterally.  No crackles, wheezing or rales Cardiovascular: RRR, -M/R/G, no JVD Extremities:-Edema,-tenderness Neuro: AAO x4, CNII-XII grossly intact Psych: Normal mood, normal affect   Data Reviewed:  Imaging: CT Chest 03/02/20 - Subsegemental atelectasis. Bilateral pleural effusions. Unchanged ground glass opacities.  CXR 10/29/20 - Chronic interstitial prominance CXR 09/15/21 - Chronic bronchitis changed. No infiltrate, effusion or edema CXR 03/01/24 - No acute infiltrate effusion or edema CXR 03/06/24 - LLL infiltrate with small left effusion CXR 03/07/24 - Unchanged LLL opacity and left effusion CXR 03/08/24 - Persistent left pleural effusion with atelectasis CXR 05/29/24 - Minimal left basilar atelectasis and trace left pleural effusion  PFT: 12/02/20 FVC 2.56 (77%) FEV1 2.12 (84%) Ratio 82  TLC 80% DLCO 73% Interpretation: No obstructive or restrictive defect is present. No significant bronchodilator response. Isolated reduced DLCO may be suggestive of early  ILD or PVD   Ambulatory O2 03/15/24  D[facility]: [facility]   POC SATURATION QUALIFICATIONS: (This note is used to comply with regulatory documentation for home oxygen)   Patient Saturations on Room Air at Rest =93 %   Patient Saturations on Room Air while Ambulating = 87%   Patient Saturations on 4 Liters of continuous oxygen while Ambulating = 95%   Please briefly explain why patient needs home oxygen: to maintain sat above 90%  Assessment & Plan:   Discussion: 73 year old female with asthma-COPD, nocturnal hypoxemic, allergic rhinitis, GERD, RA and HLD who presents for follow-up. Well controlled on Breztri . Last exacerbation 02/2024. Discussed clinical course and management of COPD/asthma including bronchodilator regimen, preventive care including vaccinations and action plan for exacerbation.  Parainfluenza pneumonia with atelectasis - resolved Pleuritic chest pain secondary to above - resolved No effusion on 05/29/24 echocardiogram  Asthma-COPD - well controlled --CONTINUE Breztri  TWO puffs TWICE a day.  --CONTINUE Albuterol  as needed for shortness of breath or wheezing. REFILLED --Encourage regular aerobic activity as tolerated --Up to date on RSV and influenza  Chronic hypoxemic respiratory failure -improved Nocturnal hypoxemia --[facility]ME: [facility]  --Ambulatory O2 goal >88% --CONTINUE 2L with activity and sleep --Due for ONO in April 2025   Allergy --Weekly allergy shots. Epi pen PRN  Rheumatoid Arthritis Previously on methotrexate  --Plaquenil   Osteoporosis --Recommend minimizing steroid use if able  Health Maintenance Immunization History  Administered Date(s) Administered   Fluad Quad(high Dose 65+) 07/20/2019, 08/19/2020   INFLUENZA, HIGH DOSE SEASONAL PF 12/16/2016, 08/30/2017, 09/11/2019, 02/07/2020, 02/12/2021, 02/18/2022   Influenza,inj,Quad PF,6-35 Mos 08/13/2022   Influenza-Unspecified 08/22/2020, 08/06/2021   Moderna Sars-Covid-2 Vaccination  06/18/2020, 07/14/2020, 02/12/2021   Pneumococcal Conjugate-13 01/23/2019   Pneumococcal Polysaccharide-23 12/16/2016, 08/30/2017, 08/24/2018, 09/11/2019, 02/07/2020, 09/25/2020, 02/12/2021, 02/18/2022   Zoster Recombinant(Shingrix) 04/13/2019, 06/27/2019   CT Lung Screen - not qualified  No orders of the defined types were placed in this encounter.  No orders of the defined types were placed in this encounter.  Return in about 6 months (around 04/14/2025).  I have spent a total time of 35-minutes on the day of the appointment including chart review, data review, collecting history, coordinating care and discussing medical diagnosis and plan with the patient/family. Past medical history, allergies, medications were reviewed. Pertinent imaging, labs and tests included in this note have been reviewed and interpreted independently by me  Matisse Salais Slater Staff, MD  [facility]

## 2024-12-17 ENCOUNTER — Encounter (INDEPENDENT_AMBULATORY_CARE_PROVIDER_SITE_OTHER): Admitting: Nurse Practitioner

## 2025-01-02 ENCOUNTER — Encounter (INDEPENDENT_AMBULATORY_CARE_PROVIDER_SITE_OTHER): Admitting: Nurse Practitioner
